# Patient Record
Sex: Female | Born: 1940
Health system: Southern US, Community
[De-identification: ages and names within clinical notes are randomized; demographics above are authoritative.]

## PROBLEM LIST (undated history)

## (undated) DIAGNOSIS — J45909 Unspecified asthma, uncomplicated: Secondary | ICD-10-CM

## (undated) DIAGNOSIS — R059 Cough, unspecified: Secondary | ICD-10-CM

## (undated) DIAGNOSIS — J310 Chronic rhinitis: Secondary | ICD-10-CM

## (undated) DIAGNOSIS — R05 Cough: Secondary | ICD-10-CM

## (undated) DIAGNOSIS — I10 Essential (primary) hypertension: Secondary | ICD-10-CM

## (undated) HISTORY — PX: ABDOMINAL HYSTERECTOMY: SHX81

## (undated) HISTORY — PX: CATARACT EXTRACTION: SUR2

## (undated) HISTORY — PX: BACK SURGERY: SHX140

## (undated) HISTORY — DX: Cough: R05

## (undated) HISTORY — DX: Essential (primary) hypertension: I10

## (undated) HISTORY — DX: Chronic rhinitis: J31.0

## (undated) HISTORY — PX: TOTAL KNEE ARTHROPLASTY: SHX125

## (undated) HISTORY — DX: Cough, unspecified: R05.9

## (undated) HISTORY — PX: ROTATOR CUFF REPAIR: SHX139

## (undated) HISTORY — DX: Unspecified asthma, uncomplicated: J45.909

---

## 2005-03-16 ENCOUNTER — Encounter: Admission: RE | Admit: 2005-03-16 | Discharge: 2005-03-16 | Payer: Self-pay | Admitting: Orthopedic Surgery

## 2005-03-30 ENCOUNTER — Encounter: Admission: RE | Admit: 2005-03-30 | Discharge: 2005-03-30 | Payer: Self-pay | Admitting: Orthopedic Surgery

## 2005-04-29 ENCOUNTER — Inpatient Hospital Stay (HOSPITAL_COMMUNITY): Admission: RE | Admit: 2005-04-29 | Discharge: 2005-05-10 | Payer: Self-pay | Admitting: Orthopaedic Surgery

## 2005-05-10 ENCOUNTER — Inpatient Hospital Stay
Admission: RE | Admit: 2005-05-10 | Discharge: 2005-05-18 | Payer: Self-pay | Admitting: Physical Medicine & Rehabilitation

## 2005-05-10 ENCOUNTER — Ambulatory Visit: Payer: Self-pay | Admitting: Physical Medicine & Rehabilitation

## 2005-10-19 ENCOUNTER — Ambulatory Visit (HOSPITAL_COMMUNITY): Admission: RE | Admit: 2005-10-19 | Discharge: 2005-10-19 | Payer: Self-pay | Admitting: Family Medicine

## 2005-11-01 ENCOUNTER — Ambulatory Visit: Payer: Self-pay | Admitting: Hematology & Oncology

## 2005-11-09 LAB — CBC WITH DIFFERENTIAL/PLATELET
BASO%: 1 % (ref 0.0–2.0)
Basophils Absolute: 0 10*3/uL (ref 0.0–0.1)
EOS%: 2 % (ref 0.0–7.0)
Eosinophils Absolute: 0.1 10*3/uL (ref 0.0–0.5)
HCT: 41.8 % (ref 34.8–46.6)
HGB: 14.1 g/dL (ref 11.6–15.9)
LYMPH%: 57 % — ABNORMAL HIGH (ref 14.0–48.0)
MCH: 30.5 pg (ref 26.0–34.0)
MCHC: 33.6 g/dL (ref 32.0–36.0)
MCV: 90.9 fL (ref 81.0–101.0)
MONO#: 0.2 10*3/uL (ref 0.1–0.9)
MONO%: 7.5 % (ref 0.0–13.0)
NEUT#: 0.8 10*3/uL — ABNORMAL LOW (ref 1.5–6.5)
NEUT%: 32.5 % — ABNORMAL LOW (ref 39.6–76.8)
Platelets: 174 10*3/uL (ref 145–400)
RBC: 4.61 10*6/uL (ref 3.70–5.32)
RDW: 14.3 % (ref 11.3–14.5)
WBC: 2.5 10*3/uL — ABNORMAL LOW (ref 3.9–10.0)
lymph#: 1.4 10*3/uL (ref 0.9–3.3)

## 2005-11-09 LAB — CHCC SMEAR

## 2005-12-13 ENCOUNTER — Encounter (INDEPENDENT_AMBULATORY_CARE_PROVIDER_SITE_OTHER): Payer: Self-pay | Admitting: *Deleted

## 2005-12-13 ENCOUNTER — Ambulatory Visit (HOSPITAL_COMMUNITY): Admission: RE | Admit: 2005-12-13 | Discharge: 2005-12-13 | Payer: Self-pay | Admitting: Hematology & Oncology

## 2007-05-08 ENCOUNTER — Encounter: Admission: RE | Admit: 2007-05-08 | Discharge: 2007-06-05 | Payer: Self-pay | Admitting: Anesthesiology

## 2008-04-15 ENCOUNTER — Encounter: Admission: RE | Admit: 2008-04-15 | Discharge: 2008-04-15 | Payer: Self-pay | Admitting: Neurology

## 2008-08-12 ENCOUNTER — Other Ambulatory Visit: Admission: RE | Admit: 2008-08-12 | Discharge: 2008-08-12 | Payer: Self-pay | Admitting: Family Medicine

## 2010-07-10 ENCOUNTER — Encounter: Payer: Self-pay | Admitting: Orthopedic Surgery

## 2010-11-05 NOTE — H&P (Signed)
[name], [name]              ACCOUNT NO.:  [id number]   MEDICAL RECORD NO.:  [id number]          PATIENT TYPE:  ORB   LOCATION:  4524                         FACILITY:  [facility]   PHYSICIAN:  Erick Colace, M.D.DATE OF BIRTH:  [birth year]   DATE OF ADMISSION:  05/10/2005  DATE OF DISCHARGE:                                HISTORY & PHYSICAL   [name] is a 70 year old female with history of DJD, degenerative disk  disease, and progressive low back with radiculopathy secondary to L4-5, L5-  S1 herniated nucleus pulposus, elected to undergo L4-5 and L5-S1  microdiskectomy per Dr. Ophelia Charter on April 29, 2005.  Postoperatively with  left lower extremity weakness and pain control issues.  CT of L spine done  on April 29, 2005, showed no evidence of hematoma.   REVIEW OF SYSTEMS:  Positive for wound area pain, lumbago, weakness,  numbness, insomnia.   PAST MEDICAL HISTORY:  1.  Hypertension.  2.  Hysterectomy.  3.  Left total knee replacement.  4.  Sleep apnea.   FAMILY HISTORY:  Carcinoma.   SOCIAL HISTORY:  She lives alone in two-level home, one step to enter, has  bedroom on first level.  No tobacco, no EtOH.   FUNCTIONAL HISTORY:  Independent with cane.   FUNCTIONAL STATUS:  Requiring assistance for ADLs and mobility.   MEDICATIONS PRIOR TO ADMISSION:  None reported.   CURRENT MEDICATIONS:  1.  Prinivil 20 mg daily.  2.  Singulair 10 mg p.o. daily.  3.  Trileptal 150 mg twice daily.  4.  Neurontin 600 mg p.o. twice daily.  5.  Ambien CR 12.5 p.o. nightly.  6.  Os-Cal with D 1 p.o. twice daily.  7.  Senokot S 2 p.o. nightly.  8.  Robaxin 500 mg p.o. 4 times a day p.r.n.  9.  Lovenox 40 mg p.o. daily.   PHYSICAL EXAMINATION:  GENERAL: No acute distress.  Mood and affect  appropriate.  LUNGS: Clear.  HEART:  Regular rate and rhythm.  No rubs, murmurs, extra sounds.  ABDOMEN:  Positive bowel sounds.  Nontender to palpation.  EXTREMITIES:  No clubbing,  cyanosis, or edema.  Straight leg raise is  negative.  NEUROLOGIC: Motor strength 5/5 bilateral upper extremities and then 4/5 in  the left hip flexor, quad, TA, and gastroc; and 5/5 on the right.  Mood,  memory, affect, orientation are normal. Left lower extremity is  hypersensitive to touch.   IMPRESSION:  1.  Herniated nucleus pulposus L4-5, L5-S1 with radiculopathy.  She is      status post microdiskectomy.  2.  Hypertension.  Continue monitor on Prinivil.  3.  History of reflex sympathetic dystrophy left lower extremity on      Neurontin for extremity, question pain related.  4.  Status post left lower extremity total knee replacement in 2004.   PLAN:  Estimated length of stay is 7 to 10 days.  The patient is a good  rehab candidate for [facility] level.      Erick Colace, M.D.  Electronically Signed     AEK/MEDQ  D:  05/10/2005  T:  05/10/2005  Job:  [id number]

## 2010-11-05 NOTE — Op Note (Signed)
NAMEGENELLA, BAS              ACCOUNT NO.:  [id number]   MEDICAL RECORD NO.:  [id number]          PATIENT TYPE:  OUT   LOCATION:  OMED                         FACILITY:  [facility]   PHYSICIAN:  Rose Phi. Myna Hidalgo, M.D. DATE OF BIRTH:  22-Feb-1941   DATE OF PROCEDURE:  12/13/2005  DATE OF DISCHARGE:                                 OPERATIVE REPORT   NATURE OF THE PROCEDURE:  Left posterior iliac crest bone marrow biopsy and  aspirate.   DESCRIPTION OF PROCEDURE:  Ms. Gangi was brought to the short-stay unit.  She was placed onto her right side.  She received a total of 5 mg of Versed  and 25 mg of Demerol for sedation via IV.   The left posterior iliac crest region was prepped and draped in a sterile  fashion.  Lidocaine 2% was infiltrated under the skin down to the  periosteum.  I used 10 cc.  I needed a 22-gauge spinal needle to reach the  posterior iliac crest.   An #11 scalpel was used to make an incision into the skin.  Two bone marrow  aspirates were obtained without difficulty.   A second incision was made into the skin.  A good bone marrow biopsy core  was obtained without difficulty.   The patient tolerated the procedure well.  There were no complications.      Rose Phi. Myna Hidalgo, M.D.  Electronically Signed     PRE/MEDQ  D:  12/13/2005  T:  12/13/2005  Job:  [id number]

## 2010-11-05 NOTE — Discharge Summary (Signed)
[name], HOEGER              ACCOUNT NO.:  [id number]   MEDICAL RECORD NO.:  [id number]          PATIENT TYPE:  INP   LOCATION:  5033                         FACILITY:  [facility]   PHYSICIAN:  Mark C. Ophelia Charter, M.D.    DATE OF BIRTH:  1940/12/01   DATE OF ADMISSION:  04/29/2005  DATE OF DISCHARGE:  05/10/2005                                 DISCHARGE SUMMARY   FINAL DIAGNOSIS:  Left L4-5, left L5-S1 herniated nucleus pulposus.   ADDITIONAL DIAGNOSES:  1.  Hypertension.  2.  Previous left knee replacement with arthrofibrosis.   This 70 year old female has had knee replacement done in [place] with  revision, only has about 30 degrees of range of motion of her knee.  Has had  progressive back pain at L5-S1, HNP at L4-5 and L5-S1.  She was admitted and  after informed consent underwent microdiskectomy, which went well.  Postoperatively the patient was very slow in moving due to weakness in her  left quadriceps from her knee procedure.  She was slow to mobilize and  stated that she wanted to go to rehab, did not want to go home, and despite  family present she did not have any family that would be able to help her.  She states she lives alone and due to patient's problems with ambulation,  arrangements were made for skilled nursing facility.  The patient was then  upset and stated she did not want to go to a nursing home.  Finally a bed  opened up on subacute.  She was ambulating to the desk and back by the 20th  and did continue complaints of pain.  A CT scan was performed, which showed  satisfactory laminectomy at L4-5 and L5-S1, mild residual disk protrusion of  L5-S1 as expected.  Anterior tibialis, gastrocnemius, soleus, peroneus were  all normal.  EHL was only slightly weak at 5-/5, but her exam was  significantly improved from preoperatively.  She still had the old left  total knee arthroplasty severe quadriceps weakness with flexion deformity  and extension lag.  She has  continued to work with physical therapy and  finally after staples were removed, she was finally transferred to [facility].   FINAL DIAGNOSES:  1.  Herniated nucleus pulposus, L4-5 and L5-S1.  2.  Arthrofibrosis from old left total knee arthroplasty with revision      procedure.      Mark C. Ophelia Charter, M.D.  Electronically Signed     MCY/MEDQ  D:  07/11/2005  T:  07/12/2005  Job:  [id number]

## 2010-11-05 NOTE — Discharge Summary (Signed)
[name], [name]              ACCOUNT NO.:  [id number]   MEDICAL RECORD NO.:  [id number]          PATIENT TYPE:  ORB   LOCATION:  4524                         FACILITY:  [facility]   PHYSICIAN:  Ranelle Oyster, M.D.DATE OF BIRTH:  Jul 13, 1940   DATE OF ADMISSION:  05/10/2005  DATE OF DISCHARGE:  05/18/2005                                 DISCHARGE SUMMARY   DISCHARGE DIAGNOSES:  1.  Herniated nucleus pulposus L4-L5, L5-S1, with radiculopathy, left lower      extremity.  2.  Reflex sympathetic dystrophy, left lower extremity.  3.  Hypertension.   HISTORY OF PRESENT ILLNESS:  [name] is a 70 year old female with  history of DDD, DJD, with progressive low back pain and radiculopathy of the  left lower extremity secondary to L4-L5 and L5-S1 HNP.  She elected to  undergo L4-L5 and L5-S1 microdiskectomy by Dr. Ophelia Charter on November 10.  Postop, the patient is noted to have increasing left lower extremity  weakness and problems with pain control.  CT L-spine done on November 17 for  follow up shows postop changes without hematoma.  Currently, patient  requiring assist for ADLs and mobility and __________ was consulted for  therapy.   PAST MEDICAL HISTORY:  1.  Significant for hypertension.  2.  Hysterectomy.  3.  Left total knee with re-do in April 2006.  4.  RSD of the left lower extremity.  5.  Sleep apnea.   ALLERGIES:  NO KNOWN DRUG ALLERGIES.   FAMILY HISTORY:  Positive for cancer.   SOCIAL HISTORY:  The patient lives alone in two-level home with one step at  entrance.  Has bedroom on first level.  Does not use any tobacco or alcohol.   HOSPITAL COURSE:  [name] was admitted to subacute on May 10, 2005 for __________ therapies to consist for PT and OT daily.  Past  admission, the patient was maintained on Neurontin and Trileptal for her  RSD.  A trial of lidocaine patch to left lower back was tried to help with  pain control issues.  Overall, the  patient's pain was managed with p.r.n.  use of oxycodone and with scheduling of Flexeril 5 mg t.i.d.  Labs done  prior to admission revealed mild anemia with hemoglobin 11.9, hematocrit  35.3, white count 3.7, platelets 226.  Electrolytes revealed sodium 138,  potassium 3.9, chloride 106, CO2 29, BUN 10, creatinine 0.8, glucose 97,  total protein 5.8, albumin 3.0.   The patient's back incision along and this has been healing well without any  signs of infection.  During her stay in subacute, the patient made progress.  She progressed to being at modified independent level for transfers,  modified independent for ambulating 150 feet, modified independent for ADLs,  modified independent for toileting and hygiene as well as advance the ADLs.  Follow-up therapies to include home health, PT, OT by [facility].  On May 18, 2005, the patient is discharged to home.   DISCHARGE MEDICATIONS:  1.  Neurontin 600 mg b.i.d.  2.  Prinivil 20 mg a day.  3.  Singulair 10 mg a day.  4.  Trileptal 150 mg b.i.d.  5.  Ambien CR 12.5 mg a day.  6.  Multivitamin once a day.  7.  Os-Cal +D b.i.d.  8.  Flexeril 5 mg t.i.d.  9.  For spasms, oxy IR 5-10 mg q.4h6. p.r.n. pain.   DIET:  Regular.   ACTIVITY:  As tolerated with use of a walker.   SPECIAL INSTRUCTIONS:  Do not use the hydrocodone.  [facility] to  draw PT, INR, RN.   FOLLOWUP:  The patient is to follow up with Dr. Ophelia Charter and Dr. Parke Simmers for  routine check.  Follow up with Dr. Riley Kill as needed.      Greg Cutter, P.A.      Ranelle Oyster, M.D.  Electronically Signed    PP/MEDQ  D:  05/19/2005  T:  05/20/2005  Job:  [id number]   cc:   Renaye Rakers, M.D.  Fax: [phone]   Veverly Fells. Ophelia Charter, M.D.  Fax: [phone]

## 2010-11-05 NOTE — Op Note (Signed)
[name], [name]              ACCOUNT NO.:  [id number]   MEDICAL RECORD NO.:  [id number]          PATIENT TYPE:  ORB   LOCATION:  4524                         FACILITY:  [facility]   PHYSICIAN:  Mark C. Ophelia Charter, M.D.    DATE OF BIRTH:  09-Jul-1940   DATE OF PROCEDURE:  DATE OF DISCHARGE:  05/18/2005                                 OPERATIVE REPORT   NO DICTATION.      Mark C. Ophelia Charter, M.D.  Electronically Signed     MCY/MEDQ  D:  07/11/2005  T:  07/12/2005  Job:  [id number]

## 2010-11-05 NOTE — Op Note (Signed)
[name], [name]              ACCOUNT NO.:  [id number]   MEDICAL RECORD NO.:  [id number]          PATIENT TYPE:  AMB   LOCATION:  SDS                          FACILITY:  [facility]   PHYSICIAN:  Mark C. Ophelia Charter, M.D.    DATE OF BIRTH:  [birth year]   DATE OF PROCEDURE:  04/29/2005  DATE OF DISCHARGE:                                 OPERATIVE REPORT   PREOPERATIVE DIAGNOSIS:  Left L4-5, left L5-S1 herniated nucleus pulposus.   POSTOPERATIVE DIAGNOSIS:  Left L4-5, left L5-S1 herniated nucleus pulposus.   OPERATION PERFORMED:  Left L4-5, left L5-S1 microdiskectomy (two-level  procedure).   SURGEON:  Mark C. Ophelia Charter, M.D.   ASSISTANT:  Patrick Jupiter, RN, FA   ANESTHESIA:  GOT   ESTIMATED BLOOD LOSS:  Minimal.   DESCRIPTION OF PROCEDURE:  After induction of general anesthesia orotracheal  intubation, patient was transferred to the [facility] table, but due to a  previous total knee arthroplasty done, that would only allow about 30  degrees flexion, it would be altered to lay the foot piece down and pillows  were placed underneath the knee and underneath her tibia.   1015 drape was place.  Back was prepped with DuraPrep.  The area was squared  with towels and Betadine Vi-drape applied and laminectomy sheet.  Needle  placed with the palpable expected L5 level and one at the L4-5 level.  Cross-  table lateral x-ray confirmed this was at the appropriate level.   After confirmation, incision was made at the midline and dissection on the  left side was performed, subperiosteal dissection and a deep Taylor  retractor was placed laterally on the left side.  L5-S1 laminotomy was  performed.  Foramina was enlarged.  There was considerable facet overgrowth  and ligamentum overgrowth laterally.  Nerve root was dorsally displaced and  once it was gently retracted toward the midline, there was a large disk  herniation where the annulus had been ruptured and pieces of nuclear disk  were sticking out  like a cauliflower.  Piece was gently teased and using the  nerve hook, the annular defect was opened further and a giant chunk of disk  was removed.  This basically decompress the space.  The nerve root was now  gently able to retract toward the midline.  Multiple passes were made  removing some remaining degenerative disk but basically, the large fragment  that had ruptured was on the left side and slightly inferior though the disk  space took care of the patient's compression problem.  Bone was taken out to  the level follow-up the pedicle.  Passes were made with a hockey stick and  Epsteins.  Identical procedure was repeated at 4-5 disk level.  There was a  broad based disk rupture with a knuckle of disk which actually was very  firm, fibrotic.  Some Epstein was used to push the disk material down out  laterally but again, this all appeared old and did not seem to be giving the  pathologic picture that would suggest the patient's acute symptoms like the  5-1 level.  There was  hypertrophic ligament facet overgrowth. This was  trimmed back giving the nerve root more freedom as it laid laterally in the  gutter.  No free fragments were present.  After thorough irrigation and  aspiration, fascia was closed with 0 Vicryl.  This broke off the edge,  leaving a 2 mm x 2 mm piece of broken needle at the eye and the deep fascia.  The patient was not very tall, weighed at 200 pounds plus.  Option was to  excise a large amount of fascia and adipose tissue to get this or leave it.  X-ray was taken and confirmed it was in subcutaneous tissue and fascial  layer, not deep in the canal and after irrigation, further closure with 2-0  and subcutaneous tissue and skin staple closure was applied.  Xeroform, 4 x  4s, instrument count, needle count was correct.      Mark C. Ophelia Charter, M.D.  Electronically Signed     MCY/MEDQ  D:  04/29/2005  T:  04/30/2005  Job:  [id number]

## 2010-11-05 NOTE — Op Note (Signed)
NAMESOUL, DEVENEY              ACCOUNT NO.:  [id number]   MEDICAL RECORD NO.:  [id number]          PATIENT TYPE:  INP   LOCATION:  5033                         FACILITY:  [facility]   PHYSICIAN:  Mark C. Ophelia Charter, M.D.    DATE OF BIRTH:  1941-01-31   DATE OF PROCEDURE:  04/29/2005  DATE OF DISCHARGE:  05/10/2005                                 OPERATIVE REPORT   PREOPERATIVE DIAGNOSIS:  Left L4-5, left L5-S1 herniated nucleus pulposus.   POSTOPERATIVE DIAGNOSIS:  Left L4-5, left L5-S1 herniated nucleus pulposus.   PROCEDURE:  Left L4-5, left L5-S1 microdiscectomy.   SURGEON:  Mark C. Ophelia Charter, M.D.   ANESTHESIA:  GOT.   ASSISTANT:  RNFA.   ESTIMATED BLOOD LOSS:  Minimal.   BRIEF CLINICAL HISTORY:  This 70 year old female has had lumbar pain x5  months without relief with MRI scan showing L4-5 and L5-S1 HNP left.  She  has positive straight leg raising at 45 degrees with both anterior tib, EHL  and some gastroc soleus weakness.   After induction of general anesthesia and orotracheal intubation, the  patient in prone position on the Gary frame, standard prepping and  draping was performed.  Needle localization confirmed the appropriate level.  Subperiosteal dissection laterally was performed.  L4-5 level was surgically  treated first.  Subperiosteal dissection laterally with talar retractor 5  pound weight.  Ligamentum was incised.  There was hypertrophic ligamentum  facet overhang and a bulging disc which was decompressed after the nerve  root was gently retracted.  Dura was intact.  Disc was bulging and after  annulus was incised, disc was decompressed using micro and then regular  pituitaries, up/down pituitaries.  Some remaining ligament was removed out  to the level of the pedicle for decompression.  Next, foraminotomy was  performed at L5-S1  and again at this level the disc appeared more firm with  facet overhang and microdiscectomy was performed at this level as well.  Disc was decompressed using combination of Epstein curettes, up and down  micropituitaries.  Wound was irrigated, 180 degree sweeps could be made with  hockey stick anterior to the dura consistent with satisfactory  decompression.  Wound was irrigated and layered closure with 0 Vicryl in  deep fascia, 2-0 Vicryl in subcutaneous tissue, skin staple closure.  Postoperative dressing and then transferred to the recovery room.      Mark C. Ophelia Charter, M.D.  Electronically Signed    MCY/MEDQ  D:  07/11/2005  T:  07/12/2005  Job:  [id number]

## 2011-07-01 DIAGNOSIS — M25559 Pain in unspecified hip: Secondary | ICD-10-CM | POA: Diagnosis not present

## 2011-07-01 DIAGNOSIS — M5126 Other intervertebral disc displacement, lumbar region: Secondary | ICD-10-CM | POA: Diagnosis not present

## 2011-07-01 DIAGNOSIS — G541 Lumbosacral plexus disorders: Secondary | ICD-10-CM | POA: Diagnosis not present

## 2011-07-01 DIAGNOSIS — F341 Dysthymic disorder: Secondary | ICD-10-CM | POA: Diagnosis not present

## 2011-07-18 DIAGNOSIS — L680 Hirsutism: Secondary | ICD-10-CM | POA: Diagnosis not present

## 2011-07-18 DIAGNOSIS — L089 Local infection of the skin and subcutaneous tissue, unspecified: Secondary | ICD-10-CM | POA: Diagnosis not present

## 2011-07-18 DIAGNOSIS — L658 Other specified nonscarring hair loss: Secondary | ICD-10-CM | POA: Diagnosis not present

## 2011-07-18 DIAGNOSIS — L659 Nonscarring hair loss, unspecified: Secondary | ICD-10-CM | POA: Diagnosis not present

## 2011-07-18 DIAGNOSIS — L439 Lichen planus, unspecified: Secondary | ICD-10-CM | POA: Diagnosis not present

## 2011-07-20 DIAGNOSIS — R059 Cough, unspecified: Secondary | ICD-10-CM | POA: Diagnosis not present

## 2011-07-20 DIAGNOSIS — J310 Chronic rhinitis: Secondary | ICD-10-CM | POA: Diagnosis not present

## 2011-07-20 DIAGNOSIS — R05 Cough: Secondary | ICD-10-CM | POA: Diagnosis not present

## 2011-08-02 DIAGNOSIS — R269 Unspecified abnormalities of gait and mobility: Secondary | ICD-10-CM | POA: Diagnosis not present

## 2011-08-02 DIAGNOSIS — F341 Dysthymic disorder: Secondary | ICD-10-CM | POA: Diagnosis not present

## 2011-08-02 DIAGNOSIS — H81399 Other peripheral vertigo, unspecified ear: Secondary | ICD-10-CM | POA: Diagnosis not present

## 2011-08-02 DIAGNOSIS — M25559 Pain in unspecified hip: Secondary | ICD-10-CM | POA: Diagnosis not present

## 2011-08-02 DIAGNOSIS — R42 Dizziness and giddiness: Secondary | ICD-10-CM | POA: Diagnosis not present

## 2011-08-02 DIAGNOSIS — G541 Lumbosacral plexus disorders: Secondary | ICD-10-CM | POA: Diagnosis not present

## 2011-08-02 DIAGNOSIS — M5126 Other intervertebral disc displacement, lumbar region: Secondary | ICD-10-CM | POA: Diagnosis not present

## 2011-08-04 DIAGNOSIS — Z1231 Encounter for screening mammogram for malignant neoplasm of breast: Secondary | ICD-10-CM | POA: Diagnosis not present

## 2011-08-09 DIAGNOSIS — R928 Other abnormal and inconclusive findings on diagnostic imaging of breast: Secondary | ICD-10-CM | POA: Diagnosis not present

## 2011-10-04 DIAGNOSIS — E785 Hyperlipidemia, unspecified: Secondary | ICD-10-CM | POA: Diagnosis not present

## 2011-10-04 DIAGNOSIS — I10 Essential (primary) hypertension: Secondary | ICD-10-CM | POA: Diagnosis not present

## 2011-10-20 DIAGNOSIS — J309 Allergic rhinitis, unspecified: Secondary | ICD-10-CM | POA: Diagnosis not present

## 2011-10-20 DIAGNOSIS — R059 Cough, unspecified: Secondary | ICD-10-CM | POA: Diagnosis not present

## 2011-10-20 DIAGNOSIS — I10 Essential (primary) hypertension: Secondary | ICD-10-CM | POA: Diagnosis not present

## 2011-10-20 DIAGNOSIS — E7800 Pure hypercholesterolemia, unspecified: Secondary | ICD-10-CM | POA: Diagnosis not present

## 2011-10-20 DIAGNOSIS — R05 Cough: Secondary | ICD-10-CM | POA: Diagnosis not present

## 2011-10-25 DIAGNOSIS — H81399 Other peripheral vertigo, unspecified ear: Secondary | ICD-10-CM | POA: Diagnosis not present

## 2011-10-25 DIAGNOSIS — Z79899 Other long term (current) drug therapy: Secondary | ICD-10-CM | POA: Diagnosis not present

## 2011-10-25 DIAGNOSIS — M25559 Pain in unspecified hip: Secondary | ICD-10-CM | POA: Diagnosis not present

## 2011-10-25 DIAGNOSIS — G541 Lumbosacral plexus disorders: Secondary | ICD-10-CM | POA: Diagnosis not present

## 2011-10-25 DIAGNOSIS — F341 Dysthymic disorder: Secondary | ICD-10-CM | POA: Diagnosis not present

## 2011-11-02 DIAGNOSIS — H524 Presbyopia: Secondary | ICD-10-CM | POA: Diagnosis not present

## 2011-11-02 DIAGNOSIS — H35369 Drusen (degenerative) of macula, unspecified eye: Secondary | ICD-10-CM | POA: Diagnosis not present

## 2011-11-02 DIAGNOSIS — H2510 Age-related nuclear cataract, unspecified eye: Secondary | ICD-10-CM | POA: Diagnosis not present

## 2011-11-22 DIAGNOSIS — I10 Essential (primary) hypertension: Secondary | ICD-10-CM | POA: Diagnosis not present

## 2011-12-12 DIAGNOSIS — S6990XA Unspecified injury of unspecified wrist, hand and finger(s), initial encounter: Secondary | ICD-10-CM | POA: Diagnosis not present

## 2011-12-12 DIAGNOSIS — M6740 Ganglion, unspecified site: Secondary | ICD-10-CM | POA: Diagnosis not present

## 2011-12-12 DIAGNOSIS — S6980XA Other specified injuries of unspecified wrist, hand and finger(s), initial encounter: Secondary | ICD-10-CM | POA: Diagnosis not present

## 2011-12-12 DIAGNOSIS — M19049 Primary osteoarthritis, unspecified hand: Secondary | ICD-10-CM | POA: Diagnosis not present

## 2011-12-30 ENCOUNTER — Other Ambulatory Visit: Payer: Self-pay | Admitting: Orthopedic Surgery

## 2012-01-03 DIAGNOSIS — M1990 Unspecified osteoarthritis, unspecified site: Secondary | ICD-10-CM | POA: Diagnosis not present

## 2012-01-03 DIAGNOSIS — I10 Essential (primary) hypertension: Secondary | ICD-10-CM | POA: Diagnosis not present

## 2012-01-05 ENCOUNTER — Encounter (HOSPITAL_BASED_OUTPATIENT_CLINIC_OR_DEPARTMENT_OTHER)
Admission: RE | Disposition: A | Discharge status: Home / Self Care | Payer: Self-pay | Source: Ambulatory Visit | Attending: Orthopedic Surgery

## 2012-01-05 ENCOUNTER — Ambulatory Visit (HOSPITAL_BASED_OUTPATIENT_CLINIC_OR_DEPARTMENT_OTHER)
Admission: RE | Admit: 2012-01-05 | Discharge: 2012-01-05 | Disposition: A | Discharge status: Home / Self Care | Payer: Medicare Other | Source: Ambulatory Visit | Attending: Orthopedic Surgery | Admitting: Orthopedic Surgery

## 2012-01-05 DIAGNOSIS — M19049 Primary osteoarthritis, unspecified hand: Secondary | ICD-10-CM | POA: Insufficient documentation

## 2012-01-05 DIAGNOSIS — L609 Nail disorder, unspecified: Secondary | ICD-10-CM | POA: Diagnosis not present

## 2012-01-05 DIAGNOSIS — M659 Unspecified synovitis and tenosynovitis, unspecified site: Secondary | ICD-10-CM | POA: Insufficient documentation

## 2012-01-05 DIAGNOSIS — L723 Sebaceous cyst: Secondary | ICD-10-CM | POA: Insufficient documentation

## 2012-01-05 DIAGNOSIS — Z79899 Other long term (current) drug therapy: Secondary | ICD-10-CM | POA: Insufficient documentation

## 2012-01-05 DIAGNOSIS — M25449 Effusion, unspecified hand: Secondary | ICD-10-CM | POA: Diagnosis not present

## 2012-01-05 DIAGNOSIS — M6740 Ganglion, unspecified site: Secondary | ICD-10-CM | POA: Diagnosis not present

## 2012-01-05 DIAGNOSIS — M24049 Loose body in unspecified finger joint(s): Secondary | ICD-10-CM | POA: Insufficient documentation

## 2012-01-05 HISTORY — PX: MASS EXCISION: SHX2000

## 2012-01-05 SURGERY — MINOR EXCISION OF MASS
Anesthesia: LOCAL | Site: Hand | Laterality: Right | Wound class: Clean

## 2012-01-05 MED ORDER — LIDOCAINE HCL 2 % IJ SOLN
INTRAMUSCULAR | Status: DC | PRN
  Administered 2012-01-05: 4 mL

## 2012-01-05 MED ORDER — HYDROCODONE-ACETAMINOPHEN 5-500 MG PO TABS: 1.0000 | ORAL_TABLET | Freq: Four times a day (QID) | ORAL | Status: AC | PRN

## 2012-01-05 MED ORDER — CEPHALEXIN 500 MG PO CAPS: 500.0000 mg | ORAL_CAPSULE | Freq: Three times a day (TID) | ORAL | Status: AC

## 2012-01-05 MED ORDER — CHLORHEXIDINE GLUCONATE 4 % EX LIQD: 60.0000 mL | Freq: Once | CUTANEOUS | Status: DC

## 2012-01-05 SURGICAL SUPPLY — 39 items
BANDAGE ADHESIVE 1X3 (GAUZE/BANDAGES/DRESSINGS) IMPLANT
BLADE SURG 15 STRL LF DISP TIS (BLADE) ×1 IMPLANT
BLADE SURG 15 STRL SS (BLADE) ×2
BNDG CMPR 9X4 STRL LF SNTH (GAUZE/BANDAGES/DRESSINGS) ×1
BNDG CMPR MD 5X2 ELC HKLP STRL (GAUZE/BANDAGES/DRESSINGS)
BNDG COHESIVE 1X5 TAN STRL LF (GAUZE/BANDAGES/DRESSINGS) ×1 IMPLANT
BNDG ELASTIC 2 VLCR STRL LF (GAUZE/BANDAGES/DRESSINGS) IMPLANT
BNDG ESMARK 4X9 LF (GAUZE/BANDAGES/DRESSINGS) ×1 IMPLANT
BRUSH SCRUB EZ PLAIN DRY (MISCELLANEOUS) ×2 IMPLANT
CLOTH BEACON ORANGE TIMEOUT ST (SAFETY) ×2 IMPLANT
CORDS BIPOLAR (ELECTRODE) IMPLANT
COVER MAYO STAND STRL (DRAPES) ×3 IMPLANT
COVER TABLE BACK 60X90 (DRAPES) ×1 IMPLANT
CUFF TOURNIQUET SINGLE 18IN (TOURNIQUET CUFF) IMPLANT
DECANTER SPIKE VIAL GLASS SM (MISCELLANEOUS) IMPLANT
DRAIN PENROSE 1/2X12 LTX STRL (WOUND CARE) IMPLANT
DRAPE SURG 17X23 STRL (DRAPES) ×2 IMPLANT
GAUZE SPONGE 4X4 12PLY STRL LF (GAUZE/BANDAGES/DRESSINGS) ×2 IMPLANT
GAUZE XEROFORM 1X8 LF (GAUZE/BANDAGES/DRESSINGS) ×1 IMPLANT
GLOVE BIOGEL M STRL SZ7.5 (GLOVE) ×1 IMPLANT
GLOVE ORTHO TXT STRL SZ7.5 (GLOVE) ×2 IMPLANT
GOWN PREVENTION PLUS XLARGE (GOWN DISPOSABLE) ×2 IMPLANT
NDL BLUNT 17GA (NEEDLE) IMPLANT
NEEDLE 27GAX1X1/2 (NEEDLE) IMPLANT
NEEDLE BLUNT 17GA (NEEDLE) ×2 IMPLANT
PACK BASIN DAY SURGERY FS (CUSTOM PROCEDURE TRAY) ×3 IMPLANT
PADDING CAST ABS 4INX4YD NS (CAST SUPPLIES) ×1
PADDING CAST ABS COTTON 4X4 ST (CAST SUPPLIES) ×1 IMPLANT
SPONGE GAUZE 4X4 12PLY (GAUZE/BANDAGES/DRESSINGS) ×1 IMPLANT
STOCKINETTE 4X48 STRL (DRAPES) ×2 IMPLANT
SUT ETHILON 5 0 P 3 18 (SUTURE) ×1
SUT NYLON ETHILON 5-0 P-3 1X18 (SUTURE) ×1 IMPLANT
SYR 20CC LL (SYRINGE) ×1 IMPLANT
SYR 3ML 23GX1 SAFETY (SYRINGE) IMPLANT
SYR CONTROL 10ML LL (SYRINGE) IMPLANT
TOWEL OR 17X24 6PK STRL BLUE (TOWEL DISPOSABLE) ×3 IMPLANT
TRAY DSU PREP LF (CUSTOM PROCEDURE TRAY) ×2 IMPLANT
UNDERPAD 30X30 INCONTINENT (UNDERPADS AND DIAPERS) ×2 IMPLANT
WATER STERILE IRR 1000ML POUR (IV SOLUTION) ×2 IMPLANT

## 2012-01-05 NOTE — Brief Op Note (Signed)
01/05/2012  8:21 AM  PATIENT:  [name]  71 y.o. female  PRE-OPERATIVE DIAGNOSIS:  mucoid cyst right long finger, history of trauma 2012.  POST-OPERATIVE DIAGNOSIS:  Mucoid cyst right long finger, djd of distal interphalangeal joint  PROCEDURE: debridement of distal interphalangeal joint, excision of mucoid cyst, synovectomy and nail decompression   SURGEON: Wyn Forster., MD   PHYSICIAN ASSISTANT:   ASSISTANTS: nurse  ANESTHESIA:   local  EBL:     BLOOD ADMINISTERED:none  DRAINS: none   LOCAL MEDICATIONS USED:  LIDOCAINE   SPECIMEN:  No Specimen  DISPOSITION OF SPECIMEN:  N/A  COUNTS:  YES  TOURNIQUET:   Total Tourniquet Time Documented: area (laterality) - 12 minutes  DICTATION: .Other Dictation: Dictation Number [id number]  PLAN OF CARE: Discharge to home after PACU  PATIENT DISPOSITION:  PACU - hemodynamically stable.

## 2012-01-05 NOTE — Op Note (Signed)
[name], [name]              ACCOUNT NO.:  [id number]  MEDICAL RECORD NO.:  [id number]  LOCATION:                                 FACILITY:  PHYSICIAN:  [name]. [name], M.D.      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  OPERATION: 1. Resection of mucoid cyst with decompression of right long finger     nail fold. 2. Debridement of DIP joint with synovectomy, removal of loose bodies,     and marginal osteophytes. 3. Irrigation DIP joint.  OPERATING SURGEON:  [name]. Sonya Epling, MD  ASSISTANT:  Nurse.  ANESTHESIA:  2% lidocaine metacarpal head level block, 4 mL 2% plain lidocaine.  This was performed as a minor operating room procedure.  PROCEDURE:  [name] was brought to room 1 of the [facility] and placed in supine position upon the operating table.  Following a proper surgical site identification protocol, her right long finger was marked in the holding area.  In room 1 after informed consent and alcohol Betadine prep, 2% lidocaine was infiltrated into the region of the common digital nerves to the long finger without complication. Within 5 minutes, excellent anesthesia of the long finger was achieved.  The right hand and arm were then prepped with Betadine soap and solution, sterilely draped.  A sterile stockinette and impervious arthroscopy drapes were applied.  Following routine surgical time-out, procedure  commenced with exsanguination of the right long finger with a latex-free Esmarch bandage.  We did note [name] had latex intolerance, therefore the entire procedure was conducted with latex-free technique.  Procedure commenced with a curvilinear incision incorporating the base of the dorsal mucoid cyst into an incision exposing the interval between the extensor tendon and the radiocollateral ligament.  Subcutaneous tissues were carefully divided taking care to prevent injury to the dorsal sensory branches of the radial proper digital nerve.  The capsule of the DIP joint was identified and a capsulotomy was performed between the distal terminal extensor tendon slip and the radiocollateral ligament.  A synovectomy of the distal interphalangeal joint was accomplished with micro rongeur and curette followed by removal of marginal osteophytes with a curette and the micro rongeur.  The DIP joint was then thoroughly irrigated.  The myxoid cyst was then unroofed, its contents were excised, and its base curetted to remove the membrane.  Bleeding points were not problematic.  The wound was then repaired with mattress suture of 5-0 nylon.  A compressive dressing was applied with Xeroflo, sterile gauze, and Coban finger dressing.  For aftercare, [name] is provided a prescription for hydrocodone 5 mg 1 p.o. q.4-6 h. p.r.n. pain, 20 tablets without refill.  She will also be provided Keflex 5 mg  1 p.o. q.8 h. x4 days as a prophylactic antibiotic due to both osseous and intra-articular workup.     [name] [name], M.D.     [name]  D:  01/05/2012  T:  01/05/2012  Job:  [id number]

## 2012-01-05 NOTE — H&P (Signed)
[name] is an 71 y.o. female.   Chief Complaint: Right long finger nail deformity and mucoid cyst. HPI: 71 year old right handed woman with a nail deformity on the radial third of the nail plate due to to a chronic mucoid cyst and osteoarthritis of the distal interphalangeal joint.   No past medical history on file.  No past surgical history on file.  No family history on file. Social History:  does not have a smoking history on file. She does not have any smokeless tobacco history on file. Her alcohol and drug histories not on file.  Allergies:  Allergies  Allergen Reactions  . Latex Shortness Of Breath    Medications Prior to Admission  Medication Sig Dispense Refill  . Beclomethasone Dipropionate (QNASL) 80 MCG/ACT AERS Place into the nose.      . Calcium-Magnesium (CALCIUM MAGNESIUM 750) 300-300 MG TABS Take by mouth.      . Diclofenac Sodium (PENNSAID) 1.5 % SOLN Place onto the skin.      Marland Kitchen diclofenac sodium (VOLTAREN) 1 % GEL Apply topically.      . docusate sodium (COLACE) 100 MG capsule Take 100 mg by mouth 2 (two) times daily.      . montelukast (SINGULAIR) 10 MG tablet Take 10 mg by mouth at bedtime.      . multivitamin-iron-minerals-folic acid (CENTRUM) chewable tablet Chew 1 tablet by mouth daily.      . niacin (NIASPAN) 500 MG CR tablet Take 500 mg by mouth at bedtime.      Marland Kitchen olmesartan-hydrochlorothiazide (BENICAR HCT) 20-12.5 MG per tablet Take 1 tablet by mouth daily.      . Pitavastatin Calcium (LIVALO) 4 MG TABS Take by mouth.        No results found for this or any previous visit (from the past 48 hour(s)). No results found.  Review of Systems  Constitutional: Negative.   HENT: Negative.   Eyes: Negative.   Respiratory: Negative.   Cardiovascular: Negative.   Gastrointestinal: Negative.   Genitourinary: Negative.   Musculoskeletal:       Background osteoarthritis of multiple interphalangeal joints no numbness no triggering prior history of trauma  to right long finger nail region.  Skin: Negative.   Neurological: Negative.   Endo/Heme/Allergies: Negative.   Psychiatric/Behavioral: Negative.     Blood pressure 113/71, pulse 68, temperature 98.5 F (36.9 C), temperature source Oral, resp. rate 16, SpO2 96.00%. Physical Exam  Constitutional: She appears well-developed.  HENT:  Head: Normocephalic and atraumatic.  Eyes: Conjunctivae and EOM are normal. Pupils are equal, round, and reactive to light.  Neck: Normal range of motion. Neck supple.  Cardiovascular: Normal rate and regular rhythm.   Respiratory: Effort normal and breath sounds normal.  GI: Soft. Bowel sounds are normal.  Musculoskeletal: Normal range of motion.  Neurological: She is alert.  Skin: Skin is warm.  Psychiatric: She has a normal mood and affect.     Assessment/Plan Chronic mucoid cyst of right long finger nail fold. There is underlying osteoarthritis of the distal interphalangeal joint. Plan is to debride mucoid cyst and distal interphalangeal joint. Surgery aftercare risks and benefits have been discussed in detail. No guarantee as to recovery of the appearance of the nail has been made or implied. Questions were invited and answered in detail.  [name],Legacy Carrender V 01/05/2012, 7:43 AM

## 2012-01-05 NOTE — Op Note (Signed)
[id number] 

## 2012-01-06 ENCOUNTER — Encounter (HOSPITAL_BASED_OUTPATIENT_CLINIC_OR_DEPARTMENT_OTHER): Payer: Self-pay | Admitting: Orthopedic Surgery

## 2012-01-16 DIAGNOSIS — H5340 Unspecified visual field defects: Secondary | ICD-10-CM | POA: Diagnosis not present

## 2012-01-16 DIAGNOSIS — H33329 Round hole, unspecified eye: Secondary | ICD-10-CM | POA: Diagnosis not present

## 2012-01-16 DIAGNOSIS — H35039 Hypertensive retinopathy, unspecified eye: Secondary | ICD-10-CM | POA: Diagnosis not present

## 2012-01-16 DIAGNOSIS — H4310 Vitreous hemorrhage, unspecified eye: Secondary | ICD-10-CM | POA: Diagnosis not present

## 2012-01-16 DIAGNOSIS — H2510 Age-related nuclear cataract, unspecified eye: Secondary | ICD-10-CM | POA: Diagnosis not present

## 2012-01-24 DIAGNOSIS — M25559 Pain in unspecified hip: Secondary | ICD-10-CM | POA: Diagnosis not present

## 2012-01-24 DIAGNOSIS — G541 Lumbosacral plexus disorders: Secondary | ICD-10-CM | POA: Diagnosis not present

## 2012-01-24 DIAGNOSIS — H81399 Other peripheral vertigo, unspecified ear: Secondary | ICD-10-CM | POA: Diagnosis not present

## 2012-01-24 DIAGNOSIS — F341 Dysthymic disorder: Secondary | ICD-10-CM | POA: Diagnosis not present

## 2012-01-26 DIAGNOSIS — H35039 Hypertensive retinopathy, unspecified eye: Secondary | ICD-10-CM | POA: Diagnosis not present

## 2012-01-26 DIAGNOSIS — H2510 Age-related nuclear cataract, unspecified eye: Secondary | ICD-10-CM | POA: Diagnosis not present

## 2012-01-26 DIAGNOSIS — H4310 Vitreous hemorrhage, unspecified eye: Secondary | ICD-10-CM | POA: Diagnosis not present

## 2012-02-13 DIAGNOSIS — H43819 Vitreous degeneration, unspecified eye: Secondary | ICD-10-CM | POA: Diagnosis not present

## 2012-02-13 DIAGNOSIS — H35039 Hypertensive retinopathy, unspecified eye: Secondary | ICD-10-CM | POA: Diagnosis not present

## 2012-02-13 DIAGNOSIS — H4310 Vitreous hemorrhage, unspecified eye: Secondary | ICD-10-CM | POA: Diagnosis not present

## 2012-02-13 DIAGNOSIS — L659 Nonscarring hair loss, unspecified: Secondary | ICD-10-CM | POA: Diagnosis not present

## 2012-02-13 DIAGNOSIS — L439 Lichen planus, unspecified: Secondary | ICD-10-CM | POA: Diagnosis not present

## 2012-02-13 DIAGNOSIS — L680 Hirsutism: Secondary | ICD-10-CM | POA: Diagnosis not present

## 2012-04-02 DIAGNOSIS — Z23 Encounter for immunization: Secondary | ICD-10-CM | POA: Diagnosis not present

## 2012-04-24 DIAGNOSIS — F341 Dysthymic disorder: Secondary | ICD-10-CM | POA: Diagnosis not present

## 2012-04-24 DIAGNOSIS — G541 Lumbosacral plexus disorders: Secondary | ICD-10-CM | POA: Diagnosis not present

## 2012-04-24 DIAGNOSIS — M542 Cervicalgia: Secondary | ICD-10-CM | POA: Diagnosis not present

## 2012-04-24 DIAGNOSIS — H81399 Other peripheral vertigo, unspecified ear: Secondary | ICD-10-CM | POA: Diagnosis not present

## 2012-04-24 DIAGNOSIS — Z79899 Other long term (current) drug therapy: Secondary | ICD-10-CM | POA: Diagnosis not present

## 2012-04-24 DIAGNOSIS — M25559 Pain in unspecified hip: Secondary | ICD-10-CM | POA: Diagnosis not present

## 2012-05-10 DIAGNOSIS — H35379 Puckering of macula, unspecified eye: Secondary | ICD-10-CM | POA: Diagnosis not present

## 2012-05-10 DIAGNOSIS — H2510 Age-related nuclear cataract, unspecified eye: Secondary | ICD-10-CM | POA: Diagnosis not present

## 2012-05-10 DIAGNOSIS — H35039 Hypertensive retinopathy, unspecified eye: Secondary | ICD-10-CM | POA: Diagnosis not present

## 2012-05-10 DIAGNOSIS — H4310 Vitreous hemorrhage, unspecified eye: Secondary | ICD-10-CM | POA: Diagnosis not present

## 2012-05-15 DIAGNOSIS — E781 Pure hyperglyceridemia: Secondary | ICD-10-CM | POA: Diagnosis not present

## 2012-05-15 DIAGNOSIS — E119 Type 2 diabetes mellitus without complications: Secondary | ICD-10-CM | POA: Diagnosis not present

## 2012-05-15 DIAGNOSIS — R0602 Shortness of breath: Secondary | ICD-10-CM | POA: Diagnosis not present

## 2012-05-15 DIAGNOSIS — E559 Vitamin D deficiency, unspecified: Secondary | ICD-10-CM | POA: Diagnosis not present

## 2012-05-15 DIAGNOSIS — I10 Essential (primary) hypertension: Secondary | ICD-10-CM | POA: Diagnosis not present

## 2012-05-15 DIAGNOSIS — Z13820 Encounter for screening for osteoporosis: Secondary | ICD-10-CM | POA: Diagnosis not present

## 2012-06-21 DIAGNOSIS — E7800 Pure hypercholesterolemia, unspecified: Secondary | ICD-10-CM | POA: Diagnosis not present

## 2012-06-21 DIAGNOSIS — M1250 Traumatic arthropathy, unspecified site: Secondary | ICD-10-CM | POA: Diagnosis not present

## 2012-06-21 DIAGNOSIS — I10 Essential (primary) hypertension: Secondary | ICD-10-CM | POA: Diagnosis not present

## 2012-06-21 DIAGNOSIS — E1110 Type 2 diabetes mellitus with ketoacidosis without coma: Secondary | ICD-10-CM | POA: Diagnosis not present

## 2012-07-13 DIAGNOSIS — H81399 Other peripheral vertigo, unspecified ear: Secondary | ICD-10-CM | POA: Diagnosis not present

## 2012-07-13 DIAGNOSIS — G541 Lumbosacral plexus disorders: Secondary | ICD-10-CM | POA: Diagnosis not present

## 2012-07-13 DIAGNOSIS — F341 Dysthymic disorder: Secondary | ICD-10-CM | POA: Diagnosis not present

## 2012-07-13 DIAGNOSIS — M25559 Pain in unspecified hip: Secondary | ICD-10-CM | POA: Diagnosis not present

## 2012-07-30 DIAGNOSIS — Z1231 Encounter for screening mammogram for malignant neoplasm of breast: Secondary | ICD-10-CM | POA: Diagnosis not present

## 2012-11-06 DIAGNOSIS — H35039 Hypertensive retinopathy, unspecified eye: Secondary | ICD-10-CM | POA: Diagnosis not present

## 2012-11-06 DIAGNOSIS — H2510 Age-related nuclear cataract, unspecified eye: Secondary | ICD-10-CM | POA: Diagnosis not present

## 2012-11-06 DIAGNOSIS — H40019 Open angle with borderline findings, low risk, unspecified eye: Secondary | ICD-10-CM | POA: Diagnosis not present

## 2012-11-08 DIAGNOSIS — H35039 Hypertensive retinopathy, unspecified eye: Secondary | ICD-10-CM | POA: Diagnosis not present

## 2012-11-08 DIAGNOSIS — H2510 Age-related nuclear cataract, unspecified eye: Secondary | ICD-10-CM | POA: Diagnosis not present

## 2012-11-08 DIAGNOSIS — H35379 Puckering of macula, unspecified eye: Secondary | ICD-10-CM | POA: Diagnosis not present

## 2012-11-08 DIAGNOSIS — H43819 Vitreous degeneration, unspecified eye: Secondary | ICD-10-CM | POA: Diagnosis not present

## 2012-11-13 DIAGNOSIS — F341 Dysthymic disorder: Secondary | ICD-10-CM | POA: Diagnosis not present

## 2012-11-13 DIAGNOSIS — Z79899 Other long term (current) drug therapy: Secondary | ICD-10-CM | POA: Diagnosis not present

## 2012-11-13 DIAGNOSIS — M542 Cervicalgia: Secondary | ICD-10-CM | POA: Diagnosis not present

## 2012-11-13 DIAGNOSIS — G541 Lumbosacral plexus disorders: Secondary | ICD-10-CM | POA: Diagnosis not present

## 2012-11-13 DIAGNOSIS — M25559 Pain in unspecified hip: Secondary | ICD-10-CM | POA: Diagnosis not present

## 2012-11-13 DIAGNOSIS — H8190 Unspecified disorder of vestibular function, unspecified ear: Secondary | ICD-10-CM | POA: Diagnosis not present

## 2012-11-13 DIAGNOSIS — H81399 Other peripheral vertigo, unspecified ear: Secondary | ICD-10-CM | POA: Diagnosis not present

## 2012-11-15 DIAGNOSIS — I10 Essential (primary) hypertension: Secondary | ICD-10-CM | POA: Diagnosis not present

## 2012-11-15 DIAGNOSIS — E119 Type 2 diabetes mellitus without complications: Secondary | ICD-10-CM | POA: Diagnosis not present

## 2012-11-26 DIAGNOSIS — H1045 Other chronic allergic conjunctivitis: Secondary | ICD-10-CM | POA: Diagnosis not present

## 2012-11-26 DIAGNOSIS — R059 Cough, unspecified: Secondary | ICD-10-CM | POA: Diagnosis not present

## 2012-11-26 DIAGNOSIS — J309 Allergic rhinitis, unspecified: Secondary | ICD-10-CM | POA: Diagnosis not present

## 2012-11-26 DIAGNOSIS — R05 Cough: Secondary | ICD-10-CM | POA: Diagnosis not present

## 2012-12-04 DIAGNOSIS — G4752 REM sleep behavior disorder: Secondary | ICD-10-CM | POA: Diagnosis not present

## 2012-12-04 DIAGNOSIS — G8929 Other chronic pain: Secondary | ICD-10-CM | POA: Diagnosis not present

## 2012-12-04 DIAGNOSIS — I10 Essential (primary) hypertension: Secondary | ICD-10-CM | POA: Diagnosis not present

## 2012-12-04 DIAGNOSIS — E7800 Pure hypercholesterolemia, unspecified: Secondary | ICD-10-CM | POA: Diagnosis not present

## 2012-12-04 DIAGNOSIS — M1250 Traumatic arthropathy, unspecified site: Secondary | ICD-10-CM | POA: Diagnosis not present

## 2012-12-19 DIAGNOSIS — H6120 Impacted cerumen, unspecified ear: Secondary | ICD-10-CM | POA: Diagnosis not present

## 2012-12-19 DIAGNOSIS — H9209 Otalgia, unspecified ear: Secondary | ICD-10-CM | POA: Diagnosis not present

## 2013-01-25 DIAGNOSIS — H40019 Open angle with borderline findings, low risk, unspecified eye: Secondary | ICD-10-CM | POA: Diagnosis not present

## 2013-01-28 ENCOUNTER — Institutional Professional Consult (permissible substitution): Payer: PRIVATE HEALTH INSURANCE | Admitting: Internal Medicine

## 2013-01-31 DIAGNOSIS — J309 Allergic rhinitis, unspecified: Secondary | ICD-10-CM | POA: Diagnosis not present

## 2013-01-31 DIAGNOSIS — R05 Cough: Secondary | ICD-10-CM | POA: Diagnosis not present

## 2013-01-31 DIAGNOSIS — R059 Cough, unspecified: Secondary | ICD-10-CM | POA: Diagnosis not present

## 2013-02-07 DIAGNOSIS — H8190 Unspecified disorder of vestibular function, unspecified ear: Secondary | ICD-10-CM | POA: Diagnosis not present

## 2013-02-07 DIAGNOSIS — M545 Low back pain, unspecified: Secondary | ICD-10-CM | POA: Diagnosis not present

## 2013-02-07 DIAGNOSIS — G541 Lumbosacral plexus disorders: Secondary | ICD-10-CM | POA: Diagnosis not present

## 2013-02-07 DIAGNOSIS — H81399 Other peripheral vertigo, unspecified ear: Secondary | ICD-10-CM | POA: Diagnosis not present

## 2013-02-07 DIAGNOSIS — M25559 Pain in unspecified hip: Secondary | ICD-10-CM | POA: Diagnosis not present

## 2013-03-26 DIAGNOSIS — K648 Other hemorrhoids: Secondary | ICD-10-CM | POA: Diagnosis not present

## 2013-03-26 DIAGNOSIS — E119 Type 2 diabetes mellitus without complications: Secondary | ICD-10-CM | POA: Diagnosis not present

## 2013-03-26 DIAGNOSIS — K625 Hemorrhage of anus and rectum: Secondary | ICD-10-CM | POA: Diagnosis not present

## 2013-03-26 DIAGNOSIS — Z1211 Encounter for screening for malignant neoplasm of colon: Secondary | ICD-10-CM | POA: Diagnosis not present

## 2013-03-26 DIAGNOSIS — Z86010 Personal history of colonic polyps: Secondary | ICD-10-CM | POA: Diagnosis not present

## 2013-03-26 DIAGNOSIS — I10 Essential (primary) hypertension: Secondary | ICD-10-CM | POA: Diagnosis not present

## 2013-04-02 DIAGNOSIS — I10 Essential (primary) hypertension: Secondary | ICD-10-CM | POA: Diagnosis not present

## 2013-04-02 DIAGNOSIS — Z23 Encounter for immunization: Secondary | ICD-10-CM | POA: Diagnosis not present

## 2013-04-02 DIAGNOSIS — E7800 Pure hypercholesterolemia, unspecified: Secondary | ICD-10-CM | POA: Diagnosis not present

## 2013-04-02 DIAGNOSIS — E119 Type 2 diabetes mellitus without complications: Secondary | ICD-10-CM | POA: Diagnosis not present

## 2013-04-08 DIAGNOSIS — L738 Other specified follicular disorders: Secondary | ICD-10-CM | POA: Diagnosis not present

## 2013-04-08 DIAGNOSIS — L678 Other hair color and hair shaft abnormalities: Secondary | ICD-10-CM | POA: Diagnosis not present

## 2013-04-08 DIAGNOSIS — L680 Hirsutism: Secondary | ICD-10-CM | POA: Diagnosis not present

## 2013-04-08 DIAGNOSIS — L659 Nonscarring hair loss, unspecified: Secondary | ICD-10-CM | POA: Diagnosis not present

## 2013-04-08 DIAGNOSIS — L439 Lichen planus, unspecified: Secondary | ICD-10-CM | POA: Diagnosis not present

## 2013-05-22 DIAGNOSIS — K625 Hemorrhage of anus and rectum: Secondary | ICD-10-CM | POA: Diagnosis not present

## 2013-05-22 DIAGNOSIS — D126 Benign neoplasm of colon, unspecified: Secondary | ICD-10-CM | POA: Diagnosis not present

## 2013-05-22 DIAGNOSIS — Z86010 Personal history of colonic polyps: Secondary | ICD-10-CM | POA: Diagnosis not present

## 2013-05-22 DIAGNOSIS — Z1211 Encounter for screening for malignant neoplasm of colon: Secondary | ICD-10-CM | POA: Diagnosis not present

## 2013-06-24 DIAGNOSIS — J069 Acute upper respiratory infection, unspecified: Secondary | ICD-10-CM | POA: Diagnosis not present

## 2013-06-27 DIAGNOSIS — R141 Gas pain: Secondary | ICD-10-CM | POA: Diagnosis not present

## 2013-06-27 DIAGNOSIS — K5730 Diverticulosis of large intestine without perforation or abscess without bleeding: Secondary | ICD-10-CM | POA: Diagnosis not present

## 2013-06-27 DIAGNOSIS — R1031 Right lower quadrant pain: Secondary | ICD-10-CM | POA: Diagnosis not present

## 2013-07-16 DIAGNOSIS — I10 Essential (primary) hypertension: Secondary | ICD-10-CM | POA: Diagnosis not present

## 2013-08-05 DIAGNOSIS — Z803 Family history of malignant neoplasm of breast: Secondary | ICD-10-CM | POA: Diagnosis not present

## 2013-08-05 DIAGNOSIS — Z1231 Encounter for screening mammogram for malignant neoplasm of breast: Secondary | ICD-10-CM | POA: Diagnosis not present

## 2013-08-12 DIAGNOSIS — L678 Other hair color and hair shaft abnormalities: Secondary | ICD-10-CM | POA: Diagnosis not present

## 2013-08-12 DIAGNOSIS — L738 Other specified follicular disorders: Secondary | ICD-10-CM | POA: Diagnosis not present

## 2013-08-12 DIAGNOSIS — L658 Other specified nonscarring hair loss: Secondary | ICD-10-CM | POA: Diagnosis not present

## 2013-08-12 DIAGNOSIS — L659 Nonscarring hair loss, unspecified: Secondary | ICD-10-CM | POA: Diagnosis not present

## 2013-08-12 DIAGNOSIS — L680 Hirsutism: Secondary | ICD-10-CM | POA: Diagnosis not present

## 2013-08-17 DIAGNOSIS — I10 Essential (primary) hypertension: Secondary | ICD-10-CM | POA: Diagnosis not present

## 2013-08-17 DIAGNOSIS — E119 Type 2 diabetes mellitus without complications: Secondary | ICD-10-CM | POA: Diagnosis not present

## 2013-08-17 DIAGNOSIS — J069 Acute upper respiratory infection, unspecified: Secondary | ICD-10-CM | POA: Diagnosis not present

## 2013-08-29 DIAGNOSIS — R05 Cough: Secondary | ICD-10-CM | POA: Diagnosis not present

## 2013-08-29 DIAGNOSIS — J309 Allergic rhinitis, unspecified: Secondary | ICD-10-CM | POA: Diagnosis not present

## 2013-08-29 DIAGNOSIS — R059 Cough, unspecified: Secondary | ICD-10-CM | POA: Diagnosis not present

## 2013-09-09 DIAGNOSIS — J310 Chronic rhinitis: Secondary | ICD-10-CM | POA: Diagnosis not present

## 2013-09-09 DIAGNOSIS — R490 Dysphonia: Secondary | ICD-10-CM | POA: Diagnosis not present

## 2013-09-09 DIAGNOSIS — R05 Cough: Secondary | ICD-10-CM | POA: Diagnosis not present

## 2013-09-09 DIAGNOSIS — J45909 Unspecified asthma, uncomplicated: Secondary | ICD-10-CM | POA: Diagnosis not present

## 2013-09-09 DIAGNOSIS — K219 Gastro-esophageal reflux disease without esophagitis: Secondary | ICD-10-CM | POA: Diagnosis not present

## 2013-09-09 DIAGNOSIS — R059 Cough, unspecified: Secondary | ICD-10-CM | POA: Diagnosis not present

## 2013-09-13 DIAGNOSIS — M719 Bursopathy, unspecified: Secondary | ICD-10-CM | POA: Diagnosis not present

## 2013-09-13 DIAGNOSIS — M67919 Unspecified disorder of synovium and tendon, unspecified shoulder: Secondary | ICD-10-CM | POA: Diagnosis not present

## 2013-09-13 DIAGNOSIS — M1710 Unilateral primary osteoarthritis, unspecified knee: Secondary | ICD-10-CM | POA: Diagnosis not present

## 2013-10-08 DIAGNOSIS — M5137 Other intervertebral disc degeneration, lumbosacral region: Secondary | ICD-10-CM | POA: Diagnosis not present

## 2013-10-08 DIAGNOSIS — M961 Postlaminectomy syndrome, not elsewhere classified: Secondary | ICD-10-CM | POA: Diagnosis not present

## 2013-10-08 DIAGNOSIS — M545 Low back pain, unspecified: Secondary | ICD-10-CM | POA: Diagnosis not present

## 2013-10-08 DIAGNOSIS — M5030 Other cervical disc degeneration, unspecified cervical region: Secondary | ICD-10-CM | POA: Diagnosis not present

## 2013-10-08 DIAGNOSIS — R42 Dizziness and giddiness: Secondary | ICD-10-CM | POA: Diagnosis not present

## 2013-10-08 DIAGNOSIS — G9009 Other idiopathic peripheral autonomic neuropathy: Secondary | ICD-10-CM | POA: Diagnosis not present

## 2013-10-08 DIAGNOSIS — G541 Lumbosacral plexus disorders: Secondary | ICD-10-CM | POA: Diagnosis not present

## 2013-10-23 DIAGNOSIS — J309 Allergic rhinitis, unspecified: Secondary | ICD-10-CM | POA: Diagnosis not present

## 2013-10-23 DIAGNOSIS — R05 Cough: Secondary | ICD-10-CM | POA: Diagnosis not present

## 2013-10-23 DIAGNOSIS — R059 Cough, unspecified: Secondary | ICD-10-CM | POA: Diagnosis not present

## 2013-10-24 DIAGNOSIS — H6120 Impacted cerumen, unspecified ear: Secondary | ICD-10-CM | POA: Diagnosis not present

## 2013-12-14 DIAGNOSIS — I10 Essential (primary) hypertension: Secondary | ICD-10-CM | POA: Diagnosis not present

## 2013-12-14 DIAGNOSIS — E119 Type 2 diabetes mellitus without complications: Secondary | ICD-10-CM | POA: Diagnosis not present

## 2013-12-16 DIAGNOSIS — H40019 Open angle with borderline findings, low risk, unspecified eye: Secondary | ICD-10-CM | POA: Diagnosis not present

## 2013-12-16 DIAGNOSIS — H2510 Age-related nuclear cataract, unspecified eye: Secondary | ICD-10-CM | POA: Diagnosis not present

## 2013-12-16 DIAGNOSIS — H524 Presbyopia: Secondary | ICD-10-CM | POA: Diagnosis not present

## 2013-12-16 DIAGNOSIS — H35039 Hypertensive retinopathy, unspecified eye: Secondary | ICD-10-CM | POA: Diagnosis not present

## 2013-12-17 DIAGNOSIS — R51 Headache: Secondary | ICD-10-CM | POA: Diagnosis not present

## 2013-12-17 DIAGNOSIS — E119 Type 2 diabetes mellitus without complications: Secondary | ICD-10-CM | POA: Diagnosis not present

## 2013-12-17 DIAGNOSIS — E7800 Pure hypercholesterolemia, unspecified: Secondary | ICD-10-CM | POA: Diagnosis not present

## 2013-12-17 DIAGNOSIS — I10 Essential (primary) hypertension: Secondary | ICD-10-CM | POA: Diagnosis not present

## 2013-12-26 ENCOUNTER — Other Ambulatory Visit: Payer: Self-pay | Admitting: Specialist

## 2013-12-26 DIAGNOSIS — Z049 Encounter for examination and observation for unspecified reason: Secondary | ICD-10-CM | POA: Diagnosis not present

## 2013-12-26 DIAGNOSIS — R51 Headache: Secondary | ICD-10-CM | POA: Diagnosis not present

## 2013-12-26 DIAGNOSIS — R519 Headache, unspecified: Secondary | ICD-10-CM

## 2013-12-26 DIAGNOSIS — Z79899 Other long term (current) drug therapy: Secondary | ICD-10-CM | POA: Diagnosis not present

## 2013-12-31 DIAGNOSIS — G518 Other disorders of facial nerve: Secondary | ICD-10-CM | POA: Diagnosis not present

## 2013-12-31 DIAGNOSIS — R51 Headache: Secondary | ICD-10-CM | POA: Diagnosis not present

## 2013-12-31 DIAGNOSIS — M542 Cervicalgia: Secondary | ICD-10-CM | POA: Diagnosis not present

## 2013-12-31 DIAGNOSIS — IMO0001 Reserved for inherently not codable concepts without codable children: Secondary | ICD-10-CM | POA: Diagnosis not present

## 2014-01-02 ENCOUNTER — Other Ambulatory Visit: Payer: Self-pay | Admitting: Specialist

## 2014-01-02 ENCOUNTER — Ambulatory Visit
Admission: RE | Admit: 2014-01-02 | Discharge: 2014-01-02 | Disposition: A | Discharge status: Home / Self Care | Payer: Medicare Other | Source: Ambulatory Visit | Attending: Specialist | Admitting: Specialist

## 2014-01-02 DIAGNOSIS — R519 Headache, unspecified: Secondary | ICD-10-CM

## 2014-01-02 DIAGNOSIS — R51 Headache: Principal | ICD-10-CM

## 2014-01-07 DIAGNOSIS — M533 Sacrococcygeal disorders, not elsewhere classified: Secondary | ICD-10-CM | POA: Diagnosis not present

## 2014-01-07 DIAGNOSIS — G894 Chronic pain syndrome: Secondary | ICD-10-CM | POA: Diagnosis not present

## 2014-01-07 DIAGNOSIS — M545 Low back pain, unspecified: Secondary | ICD-10-CM | POA: Diagnosis not present

## 2014-01-07 DIAGNOSIS — M5137 Other intervertebral disc degeneration, lumbosacral region: Secondary | ICD-10-CM | POA: Diagnosis not present

## 2014-01-07 DIAGNOSIS — Z79899 Other long term (current) drug therapy: Secondary | ICD-10-CM | POA: Diagnosis not present

## 2014-01-09 DIAGNOSIS — M25569 Pain in unspecified knee: Secondary | ICD-10-CM | POA: Diagnosis not present

## 2014-01-14 DIAGNOSIS — IMO0001 Reserved for inherently not codable concepts without codable children: Secondary | ICD-10-CM | POA: Diagnosis not present

## 2014-01-14 DIAGNOSIS — G518 Other disorders of facial nerve: Secondary | ICD-10-CM | POA: Diagnosis not present

## 2014-01-14 DIAGNOSIS — M542 Cervicalgia: Secondary | ICD-10-CM | POA: Diagnosis not present

## 2014-01-14 DIAGNOSIS — R51 Headache: Secondary | ICD-10-CM | POA: Diagnosis not present

## 2014-01-23 DIAGNOSIS — M1710 Unilateral primary osteoarthritis, unspecified knee: Secondary | ICD-10-CM | POA: Diagnosis not present

## 2014-01-23 DIAGNOSIS — I10 Essential (primary) hypertension: Secondary | ICD-10-CM | POA: Diagnosis not present

## 2014-01-28 DIAGNOSIS — G518 Other disorders of facial nerve: Secondary | ICD-10-CM | POA: Diagnosis not present

## 2014-01-28 DIAGNOSIS — IMO0001 Reserved for inherently not codable concepts without codable children: Secondary | ICD-10-CM | POA: Diagnosis not present

## 2014-01-28 DIAGNOSIS — M542 Cervicalgia: Secondary | ICD-10-CM | POA: Diagnosis not present

## 2014-01-28 DIAGNOSIS — R51 Headache: Secondary | ICD-10-CM | POA: Diagnosis not present

## 2014-02-10 DIAGNOSIS — L659 Nonscarring hair loss, unspecified: Secondary | ICD-10-CM | POA: Diagnosis not present

## 2014-02-10 DIAGNOSIS — L680 Hirsutism: Secondary | ICD-10-CM | POA: Diagnosis not present

## 2014-02-10 DIAGNOSIS — L658 Other specified nonscarring hair loss: Secondary | ICD-10-CM | POA: Diagnosis not present

## 2014-02-11 DIAGNOSIS — IMO0001 Reserved for inherently not codable concepts without codable children: Secondary | ICD-10-CM | POA: Diagnosis not present

## 2014-02-11 DIAGNOSIS — G518 Other disorders of facial nerve: Secondary | ICD-10-CM | POA: Diagnosis not present

## 2014-02-11 DIAGNOSIS — R51 Headache: Secondary | ICD-10-CM | POA: Diagnosis not present

## 2014-02-11 DIAGNOSIS — M542 Cervicalgia: Secondary | ICD-10-CM | POA: Diagnosis not present

## 2014-02-13 DIAGNOSIS — M719 Bursopathy, unspecified: Secondary | ICD-10-CM | POA: Diagnosis not present

## 2014-02-13 DIAGNOSIS — M67919 Unspecified disorder of synovium and tendon, unspecified shoulder: Secondary | ICD-10-CM | POA: Diagnosis not present

## 2014-02-13 DIAGNOSIS — M7500 Adhesive capsulitis of unspecified shoulder: Secondary | ICD-10-CM | POA: Diagnosis not present

## 2014-02-26 DIAGNOSIS — R059 Cough, unspecified: Secondary | ICD-10-CM | POA: Diagnosis not present

## 2014-02-26 DIAGNOSIS — R05 Cough: Secondary | ICD-10-CM | POA: Diagnosis not present

## 2014-02-26 DIAGNOSIS — H1045 Other chronic allergic conjunctivitis: Secondary | ICD-10-CM | POA: Diagnosis not present

## 2014-02-26 DIAGNOSIS — J309 Allergic rhinitis, unspecified: Secondary | ICD-10-CM | POA: Diagnosis not present

## 2014-03-11 DIAGNOSIS — G518 Other disorders of facial nerve: Secondary | ICD-10-CM | POA: Diagnosis not present

## 2014-03-11 DIAGNOSIS — R51 Headache: Secondary | ICD-10-CM | POA: Diagnosis not present

## 2014-03-11 DIAGNOSIS — M542 Cervicalgia: Secondary | ICD-10-CM | POA: Diagnosis not present

## 2014-03-11 DIAGNOSIS — IMO0001 Reserved for inherently not codable concepts without codable children: Secondary | ICD-10-CM | POA: Diagnosis not present

## 2014-04-07 DIAGNOSIS — E1149 Type 2 diabetes mellitus with other diabetic neurological complication: Secondary | ICD-10-CM | POA: Diagnosis not present

## 2014-04-08 DIAGNOSIS — I10 Essential (primary) hypertension: Secondary | ICD-10-CM | POA: Diagnosis not present

## 2014-04-08 DIAGNOSIS — E119 Type 2 diabetes mellitus without complications: Secondary | ICD-10-CM | POA: Diagnosis not present

## 2014-04-08 DIAGNOSIS — Z23 Encounter for immunization: Secondary | ICD-10-CM | POA: Diagnosis not present

## 2014-04-08 DIAGNOSIS — E780 Pure hypercholesterolemia: Secondary | ICD-10-CM | POA: Diagnosis not present

## 2014-04-15 DIAGNOSIS — Z0000 Encounter for general adult medical examination without abnormal findings: Secondary | ICD-10-CM | POA: Diagnosis not present

## 2014-04-15 DIAGNOSIS — Z23 Encounter for immunization: Secondary | ICD-10-CM | POA: Diagnosis not present

## 2014-04-22 DIAGNOSIS — G43719 Chronic migraine without aura, intractable, without status migrainosus: Secondary | ICD-10-CM | POA: Diagnosis not present

## 2014-05-06 DIAGNOSIS — M25561 Pain in right knee: Secondary | ICD-10-CM | POA: Diagnosis not present

## 2014-05-06 DIAGNOSIS — M25562 Pain in left knee: Secondary | ICD-10-CM | POA: Diagnosis not present

## 2014-05-06 DIAGNOSIS — Z79899 Other long term (current) drug therapy: Secondary | ICD-10-CM | POA: Diagnosis not present

## 2014-05-06 DIAGNOSIS — G8929 Other chronic pain: Secondary | ICD-10-CM | POA: Diagnosis not present

## 2014-05-19 DIAGNOSIS — M810 Age-related osteoporosis without current pathological fracture: Secondary | ICD-10-CM | POA: Diagnosis not present

## 2014-05-27 DIAGNOSIS — M1711 Unilateral primary osteoarthritis, right knee: Secondary | ICD-10-CM | POA: Diagnosis not present

## 2014-07-07 DIAGNOSIS — H40019 Open angle with borderline findings, low risk, unspecified eye: Secondary | ICD-10-CM | POA: Diagnosis not present

## 2014-07-29 DIAGNOSIS — G43719 Chronic migraine without aura, intractable, without status migrainosus: Secondary | ICD-10-CM | POA: Diagnosis not present

## 2014-08-07 DIAGNOSIS — Z803 Family history of malignant neoplasm of breast: Secondary | ICD-10-CM | POA: Diagnosis not present

## 2014-08-07 DIAGNOSIS — Z1231 Encounter for screening mammogram for malignant neoplasm of breast: Secondary | ICD-10-CM | POA: Diagnosis not present

## 2014-08-18 DIAGNOSIS — L669 Cicatricial alopecia, unspecified: Secondary | ICD-10-CM | POA: Diagnosis not present

## 2014-08-18 DIAGNOSIS — L680 Hirsutism: Secondary | ICD-10-CM | POA: Diagnosis not present

## 2014-08-21 DIAGNOSIS — M25561 Pain in right knee: Secondary | ICD-10-CM | POA: Diagnosis not present

## 2014-08-21 DIAGNOSIS — M25512 Pain in left shoulder: Secondary | ICD-10-CM | POA: Diagnosis not present

## 2014-08-21 DIAGNOSIS — M25562 Pain in left knee: Secondary | ICD-10-CM | POA: Diagnosis not present

## 2014-08-21 DIAGNOSIS — M545 Low back pain: Secondary | ICD-10-CM | POA: Diagnosis not present

## 2014-09-04 DIAGNOSIS — J309 Allergic rhinitis, unspecified: Secondary | ICD-10-CM | POA: Diagnosis not present

## 2014-09-04 DIAGNOSIS — R05 Cough: Secondary | ICD-10-CM | POA: Diagnosis not present

## 2014-09-08 DIAGNOSIS — I10 Essential (primary) hypertension: Secondary | ICD-10-CM | POA: Diagnosis not present

## 2014-09-08 DIAGNOSIS — E1149 Type 2 diabetes mellitus with other diabetic neurological complication: Secondary | ICD-10-CM | POA: Diagnosis not present

## 2014-09-09 DIAGNOSIS — E780 Pure hypercholesterolemia: Secondary | ICD-10-CM | POA: Diagnosis not present

## 2014-09-09 DIAGNOSIS — E119 Type 2 diabetes mellitus without complications: Secondary | ICD-10-CM | POA: Diagnosis not present

## 2014-09-09 DIAGNOSIS — H6123 Impacted cerumen, bilateral: Secondary | ICD-10-CM | POA: Diagnosis not present

## 2014-09-09 DIAGNOSIS — I10 Essential (primary) hypertension: Secondary | ICD-10-CM | POA: Diagnosis not present

## 2014-10-30 DIAGNOSIS — G43719 Chronic migraine without aura, intractable, without status migrainosus: Secondary | ICD-10-CM | POA: Diagnosis not present

## 2014-12-09 DIAGNOSIS — M546 Pain in thoracic spine: Secondary | ICD-10-CM | POA: Diagnosis not present

## 2014-12-09 DIAGNOSIS — G890 Central pain syndrome: Secondary | ICD-10-CM | POA: Diagnosis not present

## 2014-12-09 DIAGNOSIS — R202 Paresthesia of skin: Secondary | ICD-10-CM | POA: Diagnosis not present

## 2014-12-09 DIAGNOSIS — M545 Low back pain: Secondary | ICD-10-CM | POA: Diagnosis not present

## 2015-02-11 DIAGNOSIS — E1149 Type 2 diabetes mellitus with other diabetic neurological complication: Secondary | ICD-10-CM | POA: Diagnosis not present

## 2015-02-11 DIAGNOSIS — I10 Essential (primary) hypertension: Secondary | ICD-10-CM | POA: Diagnosis not present

## 2015-02-12 DIAGNOSIS — E119 Type 2 diabetes mellitus without complications: Secondary | ICD-10-CM | POA: Diagnosis not present

## 2015-02-12 DIAGNOSIS — I10 Essential (primary) hypertension: Secondary | ICD-10-CM | POA: Diagnosis not present

## 2015-02-24 DIAGNOSIS — H35033 Hypertensive retinopathy, bilateral: Secondary | ICD-10-CM | POA: Diagnosis not present

## 2015-02-24 DIAGNOSIS — H2512 Age-related nuclear cataract, left eye: Secondary | ICD-10-CM | POA: Diagnosis not present

## 2015-02-24 DIAGNOSIS — H40019 Open angle with borderline findings, low risk, unspecified eye: Secondary | ICD-10-CM | POA: Diagnosis not present

## 2015-03-03 DIAGNOSIS — G43719 Chronic migraine without aura, intractable, without status migrainosus: Secondary | ICD-10-CM | POA: Diagnosis not present

## 2015-03-05 DIAGNOSIS — J309 Allergic rhinitis, unspecified: Secondary | ICD-10-CM | POA: Diagnosis not present

## 2015-03-05 DIAGNOSIS — R05 Cough: Secondary | ICD-10-CM | POA: Diagnosis not present

## 2015-03-10 DIAGNOSIS — G890 Central pain syndrome: Secondary | ICD-10-CM | POA: Diagnosis not present

## 2015-03-10 DIAGNOSIS — M542 Cervicalgia: Secondary | ICD-10-CM | POA: Diagnosis not present

## 2015-03-10 DIAGNOSIS — M545 Low back pain: Secondary | ICD-10-CM | POA: Diagnosis not present

## 2015-03-26 DIAGNOSIS — Z23 Encounter for immunization: Secondary | ICD-10-CM | POA: Diagnosis not present

## 2015-05-12 DIAGNOSIS — M25562 Pain in left knee: Secondary | ICD-10-CM | POA: Diagnosis not present

## 2015-05-12 DIAGNOSIS — M545 Low back pain: Secondary | ICD-10-CM | POA: Diagnosis not present

## 2015-05-12 DIAGNOSIS — M25561 Pain in right knee: Secondary | ICD-10-CM | POA: Diagnosis not present

## 2015-05-25 DIAGNOSIS — L659 Nonscarring hair loss, unspecified: Secondary | ICD-10-CM | POA: Diagnosis not present

## 2015-05-25 DIAGNOSIS — L680 Hirsutism: Secondary | ICD-10-CM | POA: Diagnosis not present

## 2015-05-25 DIAGNOSIS — L669 Cicatricial alopecia, unspecified: Secondary | ICD-10-CM | POA: Diagnosis not present

## 2015-05-25 DIAGNOSIS — L689 Hypertrichosis, unspecified: Secondary | ICD-10-CM | POA: Diagnosis not present

## 2015-05-25 DIAGNOSIS — L089 Local infection of the skin and subcutaneous tissue, unspecified: Secondary | ICD-10-CM | POA: Diagnosis not present

## 2015-07-22 DIAGNOSIS — G894 Chronic pain syndrome: Secondary | ICD-10-CM | POA: Diagnosis not present

## 2015-07-22 DIAGNOSIS — G890 Central pain syndrome: Secondary | ICD-10-CM | POA: Diagnosis not present

## 2015-07-22 DIAGNOSIS — G8929 Other chronic pain: Secondary | ICD-10-CM | POA: Diagnosis not present

## 2015-07-27 DIAGNOSIS — I10 Essential (primary) hypertension: Secondary | ICD-10-CM | POA: Diagnosis not present

## 2015-07-27 DIAGNOSIS — E119 Type 2 diabetes mellitus without complications: Secondary | ICD-10-CM | POA: Diagnosis not present

## 2015-07-27 DIAGNOSIS — E7800 Pure hypercholesterolemia, unspecified: Secondary | ICD-10-CM | POA: Diagnosis not present

## 2015-07-29 DIAGNOSIS — M542 Cervicalgia: Secondary | ICD-10-CM | POA: Diagnosis not present

## 2015-07-29 DIAGNOSIS — E119 Type 2 diabetes mellitus without complications: Secondary | ICD-10-CM | POA: Diagnosis not present

## 2015-08-10 DIAGNOSIS — J343 Hypertrophy of nasal turbinates: Secondary | ICD-10-CM | POA: Diagnosis not present

## 2015-08-10 DIAGNOSIS — H6123 Impacted cerumen, bilateral: Secondary | ICD-10-CM | POA: Diagnosis not present

## 2015-08-10 DIAGNOSIS — J302 Other seasonal allergic rhinitis: Secondary | ICD-10-CM | POA: Diagnosis not present

## 2015-08-10 DIAGNOSIS — H9193 Unspecified hearing loss, bilateral: Secondary | ICD-10-CM | POA: Diagnosis not present

## 2015-08-11 DIAGNOSIS — Z803 Family history of malignant neoplasm of breast: Secondary | ICD-10-CM | POA: Diagnosis not present

## 2015-08-11 DIAGNOSIS — Z1231 Encounter for screening mammogram for malignant neoplasm of breast: Secondary | ICD-10-CM | POA: Diagnosis not present

## 2015-08-25 DIAGNOSIS — H35033 Hypertensive retinopathy, bilateral: Secondary | ICD-10-CM | POA: Diagnosis not present

## 2015-08-26 ENCOUNTER — Ambulatory Visit (INDEPENDENT_AMBULATORY_CARE_PROVIDER_SITE_OTHER): Payer: Medicare Other | Admitting: Allergy and Immunology

## 2015-08-26 ENCOUNTER — Encounter: Payer: Self-pay | Admitting: Allergy and Immunology

## 2015-08-26 VITALS — BP 120/85 | HR 85 | Temp 97.7°F | Resp 17

## 2015-08-26 DIAGNOSIS — J310 Chronic rhinitis: Secondary | ICD-10-CM | POA: Diagnosis not present

## 2015-08-26 DIAGNOSIS — R05 Cough: Secondary | ICD-10-CM | POA: Diagnosis not present

## 2015-08-26 DIAGNOSIS — R059 Cough, unspecified: Secondary | ICD-10-CM

## 2015-08-26 MED ORDER — MONTELUKAST SODIUM 10 MG PO TABS: 10.0000 mg | ORAL_TABLET | Freq: Every day | ORAL | Status: DC

## 2015-08-26 MED ORDER — BECLOMETHASONE DIPROPIONATE 80 MCG/ACT NA AERS: INHALATION_SPRAY | NASAL | Status: DC

## 2015-08-26 NOTE — Patient Instructions (Signed)
  Continue Singulair 10 mg daily.  Continue Qnasl 1-2 sprays once daily.  Zyrtec 10 mg once daily as needed.  Astepro 1-2 sprays once daily as needed.  Saline nasal wash prior to medicated sprays as needed throughout the day.  Xopenex as needed.  Call with persisting questions or concerns And follow-up in 6-9 months or sooner if needed.

## 2015-08-26 NOTE — Progress Notes (Signed)
     FOLLOW UP NOTE  RE: [name] MRN: [id number] DOB: [birth year] [facility] [address] [place] [address] Date of Office Visit: 08/26/2015  Subjective:  [name] is a 75 y.o. female who presents today for Nasal Congestion and Headache  Assessment:   1. Cough, appears multifactorial with significant postnasal drip component.    2. Mixed rhinitis.   3.      No previous benefit of PPI. Plan:   Meds ordered this encounter  Medications  . montelukast (SINGULAIR) 10 MG tablet    Sig: Take 1 tablet (10 mg total) by mouth at bedtime.    Dispense:  30 tablet    Refill:  5  . Beclomethasone Dipropionate (QNASL) 80 MCG/ACT AERS    Sig: 1-2 sprays into each nostril daily.    Dispense:  1 Inhaler    Refill:  5   Patient Instructions  1.  Continue Singulair 10 mg daily. 2.  Continue Qnasl 1-2 sprays once daily. 3.  Zyrtec 10 mg once daily as needed. 4.  Astepro 1-2 sprays once daily as needed. 5.  Saline nasal wash prior to medicated sprays as needed throughout the day. 6.  Xopenex as needed and call with recurring use. 7.  Call with persisting questions or concerns And follow-up in 6-9 months or sooner if needed.  HPI: [name] returns to the office in follow-up of mixed rhinitis and intermittent cough, having generally done well since her last visit in September 2016.  She recalls typically increasing symptoms, this time of year with fluctuant weather patterns.  She notices intermittent postnasal drip, throat clearing, sneezing, slight sinus pressure, and occasional cough.  She denies headache, fever, sore throat, and at this time has remained off the Qvar.  She denies any bronchodilator use and has found nasal sprays beneficial.  She is most pleased with the Qnasl.  Intermittently there is mild voice changes which improved in the recent days.  No other new medical issues questions or concerns.  Denies ED or urgent care visits,  prednisone or antibiotic courses. Reports sleep and activity are normal.  [name] has a current medication list which includes the following prescription(s): vitamin c, beclomethasone dipropionate, calcium-magnesium, clindamycin lotion, clobetasol cream, colesevelam hcl, docusate sodium, levalbuterol, multivitamin-iron-minerals-folic acid, niacin, olmesartan-hydrochlorothiazide, omeprazole, pitavastatin calcium, azelastine hcl and montelukast.   Drug Allergies: Allergies  Allergen Reactions  . Latex Shortness Of Breath   Objective:   Filed Vitals:   08/26/15 1635  BP: 120/85  Pulse: 85  Temp: 97.7 F (36.5 C)  Resp: 17   Physical Exam  Constitutional: She is well-developed, well-nourished, and in no distress.  HENT:  Head: Atraumatic.  Right Ear: Tympanic membrane and ear canal normal.  Left Ear: Tympanic membrane and ear canal normal.  Nose: Mucosal edema present. No rhinorrhea. No epistaxis.  Mouth/Throat: Oropharynx is clear and moist and mucous membranes are normal. No oropharyngeal exudate, posterior oropharyngeal edema or posterior oropharyngeal erythema.  Neck: Neck supple.  Cardiovascular: Normal rate, S1 normal and S2 normal.   No murmur heard. Pulmonary/Chest: Effort normal. She has no wheezes. She has no rhonchi. She has no rales.  Lymphadenopathy:    She has no cervical adenopathy.   Diagnostics: Spirometry:  FVC 2.32.--97%, FEV1 1.92.--104%.    [name] M. Ishmael Holter, MD  cc: Elyn Peers, MD

## 2015-09-02 DIAGNOSIS — G43719 Chronic migraine without aura, intractable, without status migrainosus: Secondary | ICD-10-CM | POA: Diagnosis not present

## 2015-09-23 DIAGNOSIS — M545 Low back pain: Secondary | ICD-10-CM | POA: Diagnosis not present

## 2015-11-03 DIAGNOSIS — M545 Low back pain: Secondary | ICD-10-CM | POA: Diagnosis not present

## 2015-11-03 DIAGNOSIS — G603 Idiopathic progressive neuropathy: Secondary | ICD-10-CM | POA: Diagnosis not present

## 2015-11-03 DIAGNOSIS — M25562 Pain in left knee: Secondary | ICD-10-CM | POA: Diagnosis not present

## 2015-11-03 DIAGNOSIS — G541 Lumbosacral plexus disorders: Secondary | ICD-10-CM | POA: Diagnosis not present

## 2015-11-03 DIAGNOSIS — G894 Chronic pain syndrome: Secondary | ICD-10-CM | POA: Diagnosis not present

## 2015-11-03 DIAGNOSIS — M546 Pain in thoracic spine: Secondary | ICD-10-CM | POA: Diagnosis not present

## 2015-11-03 DIAGNOSIS — Z79891 Long term (current) use of opiate analgesic: Secondary | ICD-10-CM | POA: Diagnosis not present

## 2015-11-25 DIAGNOSIS — E7800 Pure hypercholesterolemia, unspecified: Secondary | ICD-10-CM | POA: Diagnosis not present

## 2015-11-25 DIAGNOSIS — I10 Essential (primary) hypertension: Secondary | ICD-10-CM | POA: Diagnosis not present

## 2015-11-25 DIAGNOSIS — E1149 Type 2 diabetes mellitus with other diabetic neurological complication: Secondary | ICD-10-CM | POA: Diagnosis not present

## 2015-11-26 DIAGNOSIS — D72818 Other decreased white blood cell count: Secondary | ICD-10-CM | POA: Diagnosis not present

## 2015-11-26 DIAGNOSIS — Z6834 Body mass index (BMI) 34.0-34.9, adult: Secondary | ICD-10-CM | POA: Diagnosis not present

## 2015-11-26 DIAGNOSIS — I10 Essential (primary) hypertension: Secondary | ICD-10-CM | POA: Diagnosis not present

## 2015-11-26 DIAGNOSIS — Z23 Encounter for immunization: Secondary | ICD-10-CM | POA: Diagnosis not present

## 2015-11-26 DIAGNOSIS — E119 Type 2 diabetes mellitus without complications: Secondary | ICD-10-CM | POA: Diagnosis not present

## 2015-12-01 ENCOUNTER — Telehealth: Payer: Self-pay | Admitting: Hematology and Oncology

## 2015-12-01 ENCOUNTER — Encounter: Payer: Self-pay | Admitting: Hematology and Oncology

## 2015-12-01 NOTE — Telephone Encounter (Signed)
Appointment made with Dr. Alvy Bimler on 6/26 with Dr. Alvy Bimler at 11:15. Patient agreed. Demographics verified. Letter to referring.

## 2015-12-09 ENCOUNTER — Telehealth: Payer: Self-pay | Admitting: *Deleted

## 2015-12-09 NOTE — Telephone Encounter (Signed)
Patient called requesting Qnasl 80 sample advised sample would be left up front

## 2015-12-14 ENCOUNTER — Ambulatory Visit (HOSPITAL_BASED_OUTPATIENT_CLINIC_OR_DEPARTMENT_OTHER): Payer: Medicare Other | Admitting: Hematology and Oncology

## 2015-12-14 ENCOUNTER — Encounter: Payer: Self-pay | Admitting: Hematology and Oncology

## 2015-12-14 VITALS — BP 128/73 | HR 69 | Temp 98.1°F | Resp 18 | Ht 62.5 in | Wt 205.5 lb

## 2015-12-14 DIAGNOSIS — D72819 Decreased white blood cell count, unspecified: Secondary | ICD-10-CM | POA: Diagnosis not present

## 2015-12-15 DIAGNOSIS — D72819 Decreased white blood cell count, unspecified: Secondary | ICD-10-CM | POA: Insufficient documentation

## 2015-12-15 NOTE — Progress Notes (Signed)
[facility] NOTE  Patient Care Team: Lucianne Lei, MD as PCP - General (Family Medicine)  CHIEF COMPLAINTS/PURPOSE OF CONSULTATION:  Chronic leukopenia  HISTORY OF PRESENTING ILLNESS:  [name] 75 y.o. female is here because of chronic leukopenia.  She was found to have abnormal CBC from routine blood work monitoring. I had the opportunity to review her blood tests results dated back to 10 years ago. Her blood work from May 2007 came back low at 2.5 with ANC of 0.8. She had seen a hematologist in the past and had bone marrow aspirate and biopsy in June 2007 which came back unremarkable. She denies recent infection. She has occasional sinus drainage. The last prescription antibiotics was more than 3 months ago There is not reported symptoms of cough, urinary frequency/urgency or dysuria, diarrhea, joint swelling/pain or abnormal skin rash.  She had no prior history or diagnosis of cancer. Her age appropriate screening programs are up-to-date. The patient has no prior diagnosis of autoimmune disease. She denies history of atypical infection such as shingles. No abnormal skin rashes or joint pain.  MEDICAL HISTORY:  Past Medical History  Diagnosis Date  . Hypertension     SURGICAL HISTORY: Past Surgical History  Procedure Laterality Date  . Mass excision  01/05/2012    Procedure: MINOR EXCISION OF MASS;  Surgeon: Cammie Sickle., MD;  Location: [facility];  Service: Orthopedics;  Laterality: Right;  excision mucoid cyst right long finger, debride DIP joint  . Back surgery    . Rotator cuff repair Bilateral   . Abdominal hysterectomy    . Total knee arthroplasty Left     twice    SOCIAL HISTORY: Social History   Social History  . Marital Status: Divorced    Spouse Name: N/A  . Number of Children: N/A  . Years of Education: N/A   Occupational History  . Not on file.   Social History Main Topics  . Smoking status: Never  Smoker   . Smokeless tobacco: Never Used  . Alcohol Use: No  . Drug Use: No  . Sexual Activity: Not on file     Comment: retired from [employer]. 1 son.   Other Topics Concern  . Not on file   Social History Narrative    FAMILY HISTORY: Family History  Problem Relation Age of Onset  . Allergic rhinitis Neg Hx   . Angioedema Neg Hx   . Asthma Neg Hx   . Atopy Neg Hx   . Eczema Neg Hx   . Immunodeficiency Neg Hx   . Urticaria Neg Hx     ALLERGIES:  is allergic to latex.  MEDICATIONS:  Current Outpatient Prescriptions  Medication Sig Dispense Refill  . Ascorbic Acid (VITAMIN C) 250 MG CHEW Chew 250 mg by mouth.    . beclomethasone (QVAR) 80 MCG/ACT inhaler Inhale 1 puff into the lungs daily.    . Beclomethasone Dipropionate (QNASL) 80 MCG/ACT AERS 1-2 sprays into each nostril daily. 1 Inhaler 5  . Calcium-Magnesium (CALCIUM MAGNESIUM 750) 300-300 MG TABS Take by mouth.    . clindamycin (CLEOCIN-T) 1 % lotion     . clobetasol cream (TEMOVATE) 0.05 %     . Colesevelam HCl (WELCHOL) 3.75 g PACK     . Diclofenac Sodium (PENNSAID) 1.5 % SOLN Place onto the skin. Reported on 08/26/2015    . docusate sodium (COLACE) 100 MG capsule Take 100 mg by mouth 2 (two) times daily.    Marland Kitchen  montelukast (SINGULAIR) 10 MG tablet Take 1 tablet (10 mg total) by mouth at bedtime. 30 tablet 5  . multivitamin-iron-minerals-folic acid (CENTRUM) chewable tablet Chew 1 tablet by mouth daily.    . niacin (NIASPAN) 500 MG CR tablet Take 500 mg by mouth at bedtime.    Marland Kitchen olmesartan-hydrochlorothiazide (BENICAR HCT) 20-12.5 MG per tablet Take 1 tablet by mouth daily.    Marland Kitchen omeprazole (PRILOSEC) 40 MG capsule     . Pitavastatin Calcium (LIVALO) 4 MG TABS Take by mouth.     No current facility-administered medications for this visit.    REVIEW OF SYSTEMS:   Constitutional: Denies fevers, chills or abnormal night sweats Eyes: Denies blurriness of vision, double vision or watery eyes Ears, nose,  mouth, throat, and face: Denies mucositis or sore throat Respiratory: Denies cough, dyspnea or wheezes Cardiovascular: Denies palpitation, chest discomfort or lower extremity swelling Gastrointestinal:  Denies nausea, heartburn or change in bowel habits Skin: Denies abnormal skin rashes Lymphatics: Denies new lymphadenopathy or easy bruising Neurological:Denies numbness, tingling or new weaknesses Behavioral/Psych: Mood is stable, no new changes  All other systems were reviewed with the patient and are negative.  PHYSICAL EXAMINATION: ECOG PERFORMANCE STATUS: 0 - Asymptomatic  Filed Vitals:   12/14/15 1113  BP: 128/73  Pulse: 69  Temp: 98.1 F (36.7 C)  Resp: 18   Filed Weights   12/14/15 1113  Weight: 205 lb 8 oz (93.214 kg)    GENERAL:alert, no distress and comfortable SKIN: skin color, texture, turgor are normal, no rashes or significant lesions EYES: normal, conjunctiva are pink and non-injected, sclera clear OROPHARYNX:no exudate, no erythema and lips, buccal mucosa, and tongue normal  NECK: supple, thyroid normal size, non-tender, without nodularity LYMPH:  no palpable lymphadenopathy in the cervical, axillary or inguinal LUNGS: clear to auscultation and percussion with normal breathing effort HEART: regular rate & rhythm and no murmurs and no lower extremity edema ABDOMEN:abdomen soft, non-tender and normal bowel sounds Musculoskeletal:no cyanosis of digits and no clubbing  PSYCH: alert & oriented x 3 with fluent speech NEURO: no focal motor/sensory deficits  LABORATORY DATA:  I have reviewed the data as listed Lab Results  Component Value Date   WBC 2.5* 11/09/2005   HGB 14.1 11/09/2005   HCT 41.8 11/09/2005   MCV 90.9 11/09/2005   PLT 174 11/09/2005    ASSESSMENT & PLAN Chronic leukopenia 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 It is not uncommon to see chronic leukopenia in patients of African-American heritage. Her recent worsening  leukopenia is likely exacerbated by recent viral infection. She was asymptomatic. She had extensive workup in the past including a bone marrow aspirate and biopsy which was negative. The patient does not need long-term follow-up or further work-up. I will see her back in the future only if she has recurrent infection that does not resolve with conventional oral antibiotic therapy.   Keven Soucy, MD   All questions were answered. The patient knows to call the clinic with any problems, questions or concerns. I spent 30 minutes counseling the patient face to face. The total time spent in the appointment was 35 minutes and more than 50% was on counseling.

## 2015-12-15 NOTE — Assessment & Plan Note (Signed)
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 It is not uncommon to see chronic leukopenia in patients of African-American heritage. Her recent worsening leukopenia is likely exacerbated by recent viral infection. She was asymptomatic. She had extensive workup in the past including a bone marrow aspirate and biopsy which was negative. The patient does not need long-term follow-up or further work-up. I will see her back in the future only if she has recurrent infection that does not resolve with conventional oral antibiotic therapy.

## 2016-02-09 DIAGNOSIS — M25512 Pain in left shoulder: Secondary | ICD-10-CM | POA: Diagnosis not present

## 2016-02-09 DIAGNOSIS — G894 Chronic pain syndrome: Secondary | ICD-10-CM | POA: Diagnosis not present

## 2016-02-09 DIAGNOSIS — Z79891 Long term (current) use of opiate analgesic: Secondary | ICD-10-CM | POA: Diagnosis not present

## 2016-02-09 DIAGNOSIS — M25511 Pain in right shoulder: Secondary | ICD-10-CM | POA: Diagnosis not present

## 2016-02-09 DIAGNOSIS — M546 Pain in thoracic spine: Secondary | ICD-10-CM | POA: Diagnosis not present

## 2016-02-09 DIAGNOSIS — M545 Low back pain: Secondary | ICD-10-CM | POA: Diagnosis not present

## 2016-02-09 DIAGNOSIS — M25562 Pain in left knee: Secondary | ICD-10-CM | POA: Diagnosis not present

## 2016-02-25 ENCOUNTER — Ambulatory Visit: Payer: PRIVATE HEALTH INSURANCE | Admitting: Allergy and Immunology

## 2016-02-25 ENCOUNTER — Ambulatory Visit (INDEPENDENT_AMBULATORY_CARE_PROVIDER_SITE_OTHER): Payer: Medicare Other | Admitting: Allergy

## 2016-02-25 ENCOUNTER — Encounter (INDEPENDENT_AMBULATORY_CARE_PROVIDER_SITE_OTHER): Payer: Self-pay

## 2016-02-25 ENCOUNTER — Encounter: Payer: Self-pay | Admitting: Allergy

## 2016-02-25 VITALS — BP 124/86 | HR 80 | Resp 16

## 2016-02-25 DIAGNOSIS — R490 Dysphonia: Secondary | ICD-10-CM | POA: Diagnosis not present

## 2016-02-25 DIAGNOSIS — R059 Cough, unspecified: Secondary | ICD-10-CM

## 2016-02-25 DIAGNOSIS — J310 Chronic rhinitis: Secondary | ICD-10-CM | POA: Insufficient documentation

## 2016-02-25 DIAGNOSIS — R05 Cough: Secondary | ICD-10-CM | POA: Diagnosis not present

## 2016-02-25 DIAGNOSIS — Z23 Encounter for immunization: Secondary | ICD-10-CM | POA: Diagnosis not present

## 2016-02-25 NOTE — Progress Notes (Signed)
Follow-up Note  RE: KEVONA BUSH MRN: [id number] DOB: 09/28/1940 Date of Office Visit: 02/25/2016   History of present illness: [name] is a 75 y.o. female presenting today for follow-up of cough and rhinitis. She was last seen in our office by Dr. Ishmael Holter in March 2017. Since that time she reports that her symptoms have pretty much remained the same. She has significant postnasal drip and cough. She also endorses some hoarseness. She takes Patanase 1 spray each nostril and Qnasl 1 spray each nostril daily. She does do nasal saline rinses prior to nasal spray. She takes Singulair at night.  Has seen GI who told her she had silent acid reflux; she takes omeprazole and she eventually came off of it.    she has Xopenex but does not feel that it has been helpful thus she does not use it. She denies any wheezing or chest tightness or chest heaviness. She denies any urinary care visits, prednisone or antibiotic courses. Otherwise there are no new medical issues, questions or concerns.      Review of systems: Review of Systems  Constitutional: Negative for fever.  HENT: Positive for congestion. Negative for sore throat.   Eyes: Negative for redness.  Respiratory: Positive for cough. Negative for sputum production, shortness of breath and wheezing.   Cardiovascular: Negative for chest pain.  Gastrointestinal: Negative for nausea and vomiting.  Skin: Negative for rash.  Neurological: Negative for headaches.    All other systems negative unless noted above in HPI  Past medical/social/surgical/family history have been reviewed and are unchanged unless specifically indicated below.  No changes  Medication List:   Medication List       Accurate as of 02/25/16 12:09 PM. Always use your most recent med list.          Beclomethasone Dipropionate 80 MCG/ACT Aers Commonly known as:  QNASL 1-2 sprays into each nostril daily.   CALCIUM MAGNESIUM 750 300-300 MG Tabs Generic  drug:  Calcium-Magnesium Take by mouth.   CLEOCIN-T 1 % lotion Generic drug:  clindamycin   clobetasol cream 0.05 % Commonly known as:  TEMOVATE   docusate sodium 100 MG capsule Commonly known as:  COLACE Take 100 mg by mouth 2 (two) times daily.   LIVALO 4 MG Tabs Generic drug:  Pitavastatin Calcium Take by mouth.   montelukast 10 MG tablet Commonly known as:  SINGULAIR Take 1 tablet (10 mg total) by mouth at bedtime.   multivitamin-iron-minerals-folic acid chewable tablet Chew 1 tablet by mouth daily.   niacin 500 MG CR tablet Commonly known as:  NIASPAN Take 500 mg by mouth at bedtime.   olmesartan-hydrochlorothiazide 20-12.5 MG tablet Commonly known as:  BENICAR HCT Take 1 tablet by mouth daily.   PENNSAID 1.5 % Soln Generic drug:  Diclofenac Sodium Place onto the skin. Reported on 08/26/2015   QVAR 80 MCG/ACT inhaler Generic drug:  beclomethasone Inhale 1 puff into the lungs daily.   Vitamin C 250 MG Chew Chew 250 mg by mouth.   WELCHOL 3.75 g Pack Generic drug:  Colesevelam HCl       Known medication allergies: Allergies  Allergen Reactions  . Latex Shortness Of Breath     Physical examination: Blood pressure 124/86, pulse 80, resp. rate 16.  General: Alert, interactive, in no acute distress. HEENT: TMs pearly gray, turbinates moderately edematous without discharge, post-pharynx non erythematous.drainage in the posterior pharynx present  Neck: Supple without lymphadenopathy. Lungs: Clear to auscultation without wheezing, rhonchi  or rales. {no increased work of breathing. CV: Normal S1, S2 without murmurs. Abdomen: Nondistended, nontender. Skin: Warm and dry, without lesions or rashes. Extremities:  No clubbing, cyanosis or edema. Neuro:   Grossly intact.  Diagnositics/Labs: Spirometry: FEV1: 2.06L  117%, FVC: 2.54L 112%, ratio consistent with Nonobstructive pattern normal study.  Assessment and plan:   Cough and rhinitis  - significant  post nasal drip leading to throat irritation and cough  - silent reflux likely also contributing to throat irritation and cough  - Hoarseness may be attributed to vocal cord irritation  - use patanase 2 sprays twice a day  - use Qnasl 2 sprays daily  - continue use of saline wash prior to using your nasal spray  - start Zantac 150mg  twice a day for reflux control  - continue singulair 10mg  at bedtime  - If she does not have any improvement with this regimen consider adding nasal Atrovent and    referral to speech therapy for evaluation of VCD and voice retraining  Follow-up 6 months   I appreciate the opportunity to take part in Shaliah's care. Please do not hesitate to contact me with questions.  Sincerely,   Prudy Feeler, MD Allergy/Immunology [facility]

## 2016-02-25 NOTE — Patient Instructions (Signed)
Cough and rhinitis  - significant post nasal drip leading to throat irritation and cough  - reflux can be silent and contribute to throat irritation and cough  - use patanase 2 sprays twice a day  - use Qnasl 2 sprays daily  - continue use of saline wash prior to using your nasal spray  - start Zantac 150mg  twice a day for reflux control  - continue singulair 10mg  at bedtime  Follow-up 6 months

## 2016-02-29 DIAGNOSIS — L658 Other specified nonscarring hair loss: Secondary | ICD-10-CM | POA: Diagnosis not present

## 2016-02-29 DIAGNOSIS — L669 Cicatricial alopecia, unspecified: Secondary | ICD-10-CM | POA: Diagnosis not present

## 2016-02-29 DIAGNOSIS — L680 Hirsutism: Secondary | ICD-10-CM | POA: Diagnosis not present

## 2016-02-29 DIAGNOSIS — L679 Hair color and hair shaft abnormality, unspecified: Secondary | ICD-10-CM | POA: Diagnosis not present

## 2016-03-01 DIAGNOSIS — H40019 Open angle with borderline findings, low risk, unspecified eye: Secondary | ICD-10-CM | POA: Diagnosis not present

## 2016-03-01 DIAGNOSIS — H35033 Hypertensive retinopathy, bilateral: Secondary | ICD-10-CM | POA: Diagnosis not present

## 2016-03-07 ENCOUNTER — Other Ambulatory Visit: Payer: Self-pay | Admitting: *Deleted

## 2016-03-07 MED ORDER — MONTELUKAST SODIUM 10 MG PO TABS: 10.0000 mg | ORAL_TABLET | Freq: Every day | ORAL | 5 refills | Status: DC

## 2016-03-30 DIAGNOSIS — E119 Type 2 diabetes mellitus without complications: Secondary | ICD-10-CM | POA: Diagnosis not present

## 2016-03-30 DIAGNOSIS — D72818 Other decreased white blood cell count: Secondary | ICD-10-CM | POA: Diagnosis not present

## 2016-03-30 DIAGNOSIS — I10 Essential (primary) hypertension: Secondary | ICD-10-CM | POA: Diagnosis not present

## 2016-03-31 DIAGNOSIS — I10 Essential (primary) hypertension: Secondary | ICD-10-CM | POA: Diagnosis not present

## 2016-03-31 DIAGNOSIS — E782 Mixed hyperlipidemia: Secondary | ICD-10-CM | POA: Diagnosis not present

## 2016-03-31 DIAGNOSIS — E119 Type 2 diabetes mellitus without complications: Secondary | ICD-10-CM | POA: Diagnosis not present

## 2016-05-03 ENCOUNTER — Telehealth: Payer: Self-pay | Admitting: Allergy

## 2016-05-03 NOTE — Telephone Encounter (Signed)
Called patient and advised if expensive for patient can try otc Rhinocort and go online for coupon

## 2016-05-03 NOTE — Telephone Encounter (Signed)
Pt called to she if she could get samples of Qnasl. [phone]

## 2016-05-10 DIAGNOSIS — M25512 Pain in left shoulder: Secondary | ICD-10-CM | POA: Diagnosis not present

## 2016-05-10 DIAGNOSIS — G894 Chronic pain syndrome: Secondary | ICD-10-CM | POA: Diagnosis not present

## 2016-05-10 DIAGNOSIS — Z79891 Long term (current) use of opiate analgesic: Secondary | ICD-10-CM | POA: Diagnosis not present

## 2016-05-10 DIAGNOSIS — G890 Central pain syndrome: Secondary | ICD-10-CM | POA: Diagnosis not present

## 2016-05-10 DIAGNOSIS — M546 Pain in thoracic spine: Secondary | ICD-10-CM | POA: Diagnosis not present

## 2016-05-10 DIAGNOSIS — M545 Low back pain: Secondary | ICD-10-CM | POA: Diagnosis not present

## 2016-05-10 DIAGNOSIS — M25511 Pain in right shoulder: Secondary | ICD-10-CM | POA: Diagnosis not present

## 2016-08-01 DIAGNOSIS — H6123 Impacted cerumen, bilateral: Secondary | ICD-10-CM | POA: Diagnosis not present

## 2016-08-01 DIAGNOSIS — H9193 Unspecified hearing loss, bilateral: Secondary | ICD-10-CM | POA: Diagnosis not present

## 2016-08-01 DIAGNOSIS — J302 Other seasonal allergic rhinitis: Secondary | ICD-10-CM | POA: Diagnosis not present

## 2016-08-09 DIAGNOSIS — M25511 Pain in right shoulder: Secondary | ICD-10-CM | POA: Diagnosis not present

## 2016-08-09 DIAGNOSIS — M25512 Pain in left shoulder: Secondary | ICD-10-CM | POA: Diagnosis not present

## 2016-08-09 DIAGNOSIS — Z79891 Long term (current) use of opiate analgesic: Secondary | ICD-10-CM | POA: Diagnosis not present

## 2016-08-09 DIAGNOSIS — M25562 Pain in left knee: Secondary | ICD-10-CM | POA: Diagnosis not present

## 2016-08-09 DIAGNOSIS — M25561 Pain in right knee: Secondary | ICD-10-CM | POA: Diagnosis not present

## 2016-08-09 DIAGNOSIS — G894 Chronic pain syndrome: Secondary | ICD-10-CM | POA: Diagnosis not present

## 2016-08-09 DIAGNOSIS — M545 Low back pain: Secondary | ICD-10-CM | POA: Diagnosis not present

## 2016-08-17 DIAGNOSIS — Z803 Family history of malignant neoplasm of breast: Secondary | ICD-10-CM | POA: Diagnosis not present

## 2016-08-17 DIAGNOSIS — Z1231 Encounter for screening mammogram for malignant neoplasm of breast: Secondary | ICD-10-CM | POA: Diagnosis not present

## 2016-08-25 ENCOUNTER — Encounter (INDEPENDENT_AMBULATORY_CARE_PROVIDER_SITE_OTHER): Payer: Self-pay

## 2016-08-25 ENCOUNTER — Encounter: Payer: Self-pay | Admitting: Allergy

## 2016-08-25 ENCOUNTER — Ambulatory Visit (INDEPENDENT_AMBULATORY_CARE_PROVIDER_SITE_OTHER): Payer: Medicare Other | Admitting: Allergy

## 2016-08-25 VITALS — BP 126/80 | HR 88 | Temp 98.5°F | Resp 17 | Ht 63.75 in | Wt 208.2 lb

## 2016-08-25 DIAGNOSIS — R05 Cough: Secondary | ICD-10-CM

## 2016-08-25 DIAGNOSIS — J310 Chronic rhinitis: Secondary | ICD-10-CM | POA: Diagnosis not present

## 2016-08-25 DIAGNOSIS — R059 Cough, unspecified: Secondary | ICD-10-CM

## 2016-08-25 MED ORDER — IPRATROPIUM BROMIDE 0.03 % NA SOLN: 2.0000 | Freq: Three times a day (TID) | NASAL | 5 refills | Status: DC

## 2016-08-25 MED ORDER — AZELASTINE HCL 0.1 % NA SOLN: 2.0000 | Freq: Two times a day (BID) | NASAL | 5 refills | Status: DC

## 2016-08-25 NOTE — Progress Notes (Signed)
Follow-up Note  RE: [name] MRN: [id number] DOB: 1941-01-28 Date of Office Visit: 08/25/2016   History of present illness: [name] is a 76 y.o. female presenting today for follow-up of chronic rhinitis and cough.  She was last seen in the office on 02/25/2016 by myself.  She reports she still continues to have significant postnasal drip leading to throat irritation and cough. She was using Qnasl which she reports was helpful however her insurance no longer covers this that she has not been using this medication. She does continue to use Patanase but she does not feel that it has been helpful. She also takes Zantac as well as Singulair. She still has some hoarseness to her voice that is worse in the morning.   Otherwise since her last visit she has not had any changes with her health, surgeries or hospitalizations. She has had no antibiotic or antiviral needs.   Review of systems: Review of Systems  Constitutional: Negative for chills, fever and malaise/fatigue.  HENT: Positive for congestion and sore throat. Negative for ear discharge, ear pain, nosebleeds, sinus pain and tinnitus.   Eyes: Negative for discharge and redness.  Respiratory: Positive for cough. Negative for sputum production, shortness of breath and wheezing.   Cardiovascular: Negative for chest pain.  Gastrointestinal: Negative for abdominal pain, heartburn, nausea and vomiting.  Musculoskeletal: Negative for joint pain and myalgias.  Skin: Negative for itching and rash.  Neurological: Negative for headaches.    All other systems negative unless noted above in HPI  Past medical/social/surgical/family history have been reviewed and are unchanged unless specifically indicated below.  No changes  Medication List: Allergies as of 08/25/2016      Reactions   Latex Shortness Of Breath      Medication List       Accurate as of 08/25/16  3:08 PM. Always use your most recent med list.            beclomethasone 80 MCG/ACT inhaler Commonly known as:  QVAR Inhale into the lungs.   Beclomethasone Dipropionate 80 MCG/ACT Aers Commonly known as:  QNASL 1-2 sprays into each nostril daily.   betamethasone dipropionate 0.05 % ointment Commonly known as:  DIPROLENE   CALCIUM MAGNESIUM 750 300-300 MG Tabs Generic drug:  Calcium-Magnesium Take by mouth.   CLEOCIN-T 1 % lotion Generic drug:  clindamycin   clobetasol cream 0.05 % Commonly known as:  TEMOVATE   LIVALO 4 MG Tabs Generic drug:  Pitavastatin Calcium Take by mouth.   MAGNESIUM PO Take by mouth.   montelukast 10 MG tablet Commonly known as:  SINGULAIR Take 1 tablet (10 mg total) by mouth at bedtime.   multivitamin-iron-minerals-folic acid chewable tablet Chew 1 tablet by mouth daily.   niacin 500 MG CR tablet Commonly known as:  NIASPAN Take 500 mg by mouth at bedtime.   olmesartan-hydrochlorothiazide 20-12.5 MG tablet Commonly known as:  BENICAR HCT Take 1 tablet by mouth daily.   Olopatadine HCl 0.6 % Soln Place into the nose.   diclofenac sodium 1 % Gel Commonly known as:  VOLTAREN   PENNSAID 1.5 % Soln Generic drug:  Diclofenac Sodium Place onto the skin. Reported on 08/26/2015   Vitamin C 250 MG Chew Chew 250 mg by mouth.   WELCHOL 3.75 g Pack Generic drug:  Colesevelam HCl       Known medication allergies: Allergies  Allergen Reactions  . Latex Shortness Of Breath     Physical examination: Blood pressure  126/80, pulse 88, temperature 98.5 F (36.9 C), temperature source Oral, resp. rate 17, height 5' 3.75" (1.619 m), weight 208 lb 3.2 oz (94.4 kg), SpO2 97 %.  General: Alert, interactive, in no acute distress. HEENT: TMs pearly gray, turbinates mildly edematous with clear discharge, post-pharynx non erythematous positive cobblestoning Neck: Supple without lymphadenopathy. Lungs: Clear to auscultation without wheezing, rhonchi or rales. {no increased work of breathing. CV: Normal  S1, S2 without murmurs. Abdomen: Nondistended, nontender. Skin: Warm and dry, without lesions or rashes. Extremities:  No clubbing, cyanosis or edema. Neuro:   Grossly intact.  Diagnositics/Labs:  Spirometry: FEV1: 1.98L  108%, FVC: 2.44L  103%, ratio consistent with nonobstructive pattern  Assessment and plan: Chronic rhinitis with cough  - significant post nasal drip leading to throat irritation and cough  - provided with Dymista sample to use 1 spray twice a day --- let us know if you have improvement on this medication.  Once done with this then use the sprays as below  - use Atrovent 2 sprays each nostrils as needed up to 3-4 times a day for nasal drip/drainage  - will switch Patanase to Astelin 2 sprays twice a day  - use Qnasl 2 sprays twice a day (provided with samples)  - we can also try Omnaris nasal spray if above does not provide enough benefit as does appear to be covered by her insurance  - continue use of saline wash prior to using your nasal spray  - continueZantac 150mg  twice a day for reflux control  - continue singulair 10mg  at bedtime  Follow-up 6-9 months or sooner if needed  I appreciate the opportunity to take part in Myron's care. Please do not hesitate to contact me with questions.  Sincerely,   Prudy Feeler, MD Allergy/Immunology [facility] [place]

## 2016-08-25 NOTE — Patient Instructions (Addendum)
Cough and rhinitis    - significant post nasal drip leading to throat irritation and cough  - reflux can be silent and contribute to throat irritation and cough  - provided with Dymista sample to use 1 spray twice a day --- let us know if you have improvement on this medication.  Once done with this then use the sprays as below  - use Atrovent 2 sprays each nostrils as needed up to 3-4 times a day for nasal drip/drainage  - will switch Patanase to Astelin 2 sprays twice a day  - use Qnasl 2 sprays twice a day (provided with samples)  - we can also try Omnaris nasal spray if above does not provide enough benefit  - continue use of saline wash prior to using your nasal spray  - continueZantac 150mg  twice a day for reflux control  - continue singulair 10mg  at bedtime  Follow-up 6-9 months or sooner if needed

## 2016-08-26 ENCOUNTER — Other Ambulatory Visit: Payer: Self-pay

## 2016-08-26 DIAGNOSIS — J310 Chronic rhinitis: Secondary | ICD-10-CM

## 2016-08-26 MED ORDER — IPRATROPIUM BROMIDE 0.03 % NA SOLN: 2.0000 | Freq: Three times a day (TID) | NASAL | 1 refills | Status: DC

## 2016-08-26 NOTE — Telephone Encounter (Signed)
Pt request 90 day supply

## 2016-08-29 DIAGNOSIS — H2512 Age-related nuclear cataract, left eye: Secondary | ICD-10-CM | POA: Diagnosis not present

## 2016-08-29 DIAGNOSIS — H35033 Hypertensive retinopathy, bilateral: Secondary | ICD-10-CM | POA: Diagnosis not present

## 2016-09-07 DIAGNOSIS — H35033 Hypertensive retinopathy, bilateral: Secondary | ICD-10-CM | POA: Diagnosis not present

## 2016-09-22 DIAGNOSIS — J111 Influenza due to unidentified influenza virus with other respiratory manifestations: Secondary | ICD-10-CM | POA: Diagnosis not present

## 2016-10-04 DIAGNOSIS — E119 Type 2 diabetes mellitus without complications: Secondary | ICD-10-CM | POA: Diagnosis not present

## 2016-10-04 DIAGNOSIS — J1000 Influenza due to other identified influenza virus with unspecified type of pneumonia: Secondary | ICD-10-CM | POA: Diagnosis not present

## 2016-10-25 ENCOUNTER — Other Ambulatory Visit: Payer: Self-pay | Admitting: Allergy & Immunology

## 2016-10-28 DIAGNOSIS — H18413 Arcus senilis, bilateral: Secondary | ICD-10-CM | POA: Diagnosis not present

## 2016-10-28 DIAGNOSIS — H02839 Dermatochalasis of unspecified eye, unspecified eyelid: Secondary | ICD-10-CM | POA: Diagnosis not present

## 2016-10-28 DIAGNOSIS — H2512 Age-related nuclear cataract, left eye: Secondary | ICD-10-CM | POA: Diagnosis not present

## 2016-10-28 DIAGNOSIS — Z961 Presence of intraocular lens: Secondary | ICD-10-CM | POA: Diagnosis not present

## 2016-11-08 DIAGNOSIS — E785 Hyperlipidemia, unspecified: Secondary | ICD-10-CM | POA: Diagnosis not present

## 2016-11-08 DIAGNOSIS — E119 Type 2 diabetes mellitus without complications: Secondary | ICD-10-CM | POA: Diagnosis not present

## 2016-11-08 DIAGNOSIS — I10 Essential (primary) hypertension: Secondary | ICD-10-CM | POA: Diagnosis not present

## 2016-11-10 DIAGNOSIS — E119 Type 2 diabetes mellitus without complications: Secondary | ICD-10-CM | POA: Diagnosis not present

## 2016-11-10 DIAGNOSIS — E782 Mixed hyperlipidemia: Secondary | ICD-10-CM | POA: Diagnosis not present

## 2016-11-10 DIAGNOSIS — I10 Essential (primary) hypertension: Secondary | ICD-10-CM | POA: Diagnosis not present

## 2016-11-28 DIAGNOSIS — L669 Cicatricial alopecia, unspecified: Secondary | ICD-10-CM | POA: Diagnosis not present

## 2016-11-28 DIAGNOSIS — L680 Hirsutism: Secondary | ICD-10-CM | POA: Diagnosis not present

## 2016-12-26 DIAGNOSIS — H2512 Age-related nuclear cataract, left eye: Secondary | ICD-10-CM | POA: Diagnosis not present

## 2017-03-01 ENCOUNTER — Ambulatory Visit (INDEPENDENT_AMBULATORY_CARE_PROVIDER_SITE_OTHER): Payer: Medicare Other | Admitting: Allergy

## 2017-03-01 ENCOUNTER — Encounter: Payer: Self-pay | Admitting: Allergy

## 2017-03-01 VITALS — BP 128/72 | HR 92 | Resp 17

## 2017-03-01 DIAGNOSIS — R05 Cough: Secondary | ICD-10-CM

## 2017-03-01 DIAGNOSIS — J310 Chronic rhinitis: Secondary | ICD-10-CM

## 2017-03-01 DIAGNOSIS — R059 Cough, unspecified: Secondary | ICD-10-CM

## 2017-03-01 MED ORDER — BECLOMETHASONE DIPROPIONATE 80 MCG/ACT NA AERS: 2.0000 | INHALATION_SPRAY | NASAL | 5 refills | Status: DC

## 2017-03-01 MED ORDER — LEVALBUTEROL TARTRATE 45 MCG/ACT IN AERO: 2.0000 | INHALATION_SPRAY | RESPIRATORY_TRACT | 1 refills | Status: DC | PRN

## 2017-03-01 MED ORDER — CARBINOXAMINE MALEATE 6 MG PO TABS: 6.0000 mg | ORAL_TABLET | Freq: Two times a day (BID) | ORAL | 5 refills | Status: DC

## 2017-03-01 NOTE — Patient Instructions (Addendum)
Cough and rhinitis   - significant post nasal drip leading to throat irritation and cough  - reflux can be silent and contribute to throat irritation and cough  - use Qnasl - 2 sprays twice a day (provided with samples today) -- will order and place a prior auth to see if we can get Qnasl covered.    - continue use of saline wash prior to using your nasal spray  - continue Zantac 150mg  twice a day for reflux control  - continue singulair 10mg  at bedtime  - use Xopenex 2 sprays each nostril every 4-6 hours as needed for cough/wheeze/shortness of breath/chest tightness.  This is a quick relief medication.   Hold Qvar as this is more of a controller medication.     - will provide with RyVent 6mg  and may take up to twice a day to help with nasal drainage.  Take first dose in evening to see if will make you drowsy if not then can take during the day.  This is an antihistamine medication  Follow-up 6-9 months or sooner if needed

## 2017-03-01 NOTE — Progress Notes (Signed)
Follow-up Note  RE: EMILIANNA BARLOWE MRN: [id number] DOB: 02/25/1941 Date of Office Visit: 03/01/2017   History of present illness: [name] is a 76 y.o. female presenting today for follow-up of cough and chronic rhinitis.   She was last seen in the office on 08/25/16 by myself.  Since this visit she denies any major health changes or hospitalizations.  She did have lt cataract removal on 12/26/16.   With her nasal drainage she reports Qnasl has been the most effective nasal spray for her.  She has tried Dymista, Atrovent, Patanase without any improvement in her symptoms.  She rant out of her Qnasl that was provided as samples about a month ago.   She does continue to take Singulair daily.   She was prescribed Qvar by Dr. Ishmael Holter and reports she uses it for relief of coughing spells.  She states she does not have albuterol or xopenex for rescue of symptoms.  She reports she will use the Qvar rather infrequently.  She denies any nighttime awakenings or oral steroid use.     Review of systems: Review of Systems  Constitutional: Negative for chills, fever and malaise/fatigue.  HENT: Positive for congestion. Negative for ear discharge, ear pain, nosebleeds, sinus pain, sore throat and tinnitus.   Eyes: Negative for pain, discharge and redness.  Respiratory: Negative for cough, shortness of breath and wheezing.   Cardiovascular: Negative for chest pain.  Gastrointestinal: Negative for abdominal pain, constipation, diarrhea, heartburn, nausea and vomiting.  Musculoskeletal: Negative for joint pain.  Skin: Negative for itching and rash.  Neurological: Negative for headaches.    All other systems negative unless noted above in HPI  Past medical/social/surgical/family history have been reviewed and are unchanged unless specifically indicated below.  No changes  Medication List: Allergies as of 03/01/2017      Reactions   Latex Shortness Of Breath      Medication List         Accurate as of 03/01/17  5:07 PM. Always use your most recent med list.          azelastine 0.1 % nasal spray Commonly known as:  ASTELIN Place 2 sprays into both nostrils 2 (two) times daily. Use in each nostril as directed   beclomethasone 80 MCG/ACT inhaler Commonly known as:  QVAR Inhale into the lungs.   betamethasone dipropionate 0.05 % ointment Commonly known as:  DIPROLENE   CALCIUM MAGNESIUM 750 300-300 MG Tabs Generic drug:  Calcium-Magnesium Take by mouth.   CLEOCIN-T 1 % lotion Generic drug:  clindamycin   clobetasol cream 0.05 % Commonly known as:  TEMOVATE   docusate sodium 100 MG capsule Commonly known as:  COLACE Take 100 mg by mouth daily as needed for mild constipation.   Doxycycline Hyclate 50 MG Tabs Take 100 mg by mouth daily.   ipratropium 0.03 % nasal spray Commonly known as:  ATROVENT Place 2 sprays into both nostrils 3 (three) times daily. Use as needed for nasal drainage/drip   LIVALO 4 MG Tabs Generic drug:  Pitavastatin Calcium Take by mouth.   MAGNESIUM PO Take by mouth.   montelukast 10 MG tablet Commonly known as:  SINGULAIR TAKE 1 TABLET BY MOUTH AT BEDTIME.   multivitamin-iron-minerals-folic acid chewable tablet Chew 1 tablet by mouth daily.   niacin 500 MG CR tablet Commonly known as:  NIASPAN Take 500 mg by mouth at bedtime.   olmesartan-hydrochlorothiazide 20-12.5 MG tablet Commonly known as:  BENICAR HCT Take 1 tablet  by mouth daily.   Olopatadine HCl 0.6 % Soln Place into the nose.   diclofenac sodium 1 % Gel Commonly known as:  VOLTAREN   PENNSAID 1.5 % Soln Generic drug:  Diclofenac Sodium Place onto the skin. Reported on 08/26/2015   Vitamin C 250 MG Chew Chew 250 mg by mouth.   WELCHOL 3.75 g Pack Generic drug:  Colesevelam HCl   XOPENEX HFA 45 MCG/ACT inhaler Generic drug:  levalbuterol Inhale 1-2 puffs into the lungs every 4 (four) hours as needed for wheezing.       Known medication  allergies: Allergies  Allergen Reactions  . Latex Shortness Of Breath     Physical examination: Blood pressure 128/72, pulse 92, resp. rate 17, SpO2 96 %.  General: Alert, interactive, in no acute distress. HEENT: PERRLA, TMs pearly gray, turbinates moderately edematous with clear discharge, post-pharynx non erythematous. Neck: Supple without lymphadenopathy. Lungs: Clear to auscultation without wheezing, rhonchi or rales. {no increased work of breathing. CV: Normal S1, S2 without murmurs. Abdomen: Nondistended, nontender. Skin: Warm and dry, without lesions or rashes. Extremities:  No clubbing, cyanosis or edema. Neuro:   Grossly intact.  Diagnositics/Labs:  Spirometry: FEV1: 2.12L  116%, FVC: 3.05L  129%, ratio consistent with nonbostructive pattern  Assessment and plan:   Cough and rhinitis   - significant post nasal drip leading to throat irritation and cough  - reflux can be silent and contribute to throat irritation and cough  - use Qnasl - 2 sprays twice a day (provided with samples today) -- will order and place a prior auth to see if we can get Qnasl covered.    - continue use of saline wash prior to using your nasal spray  - continue Zantac 150mg  twice a day for reflux control  - continue singulair 10mg  at bedtime  - use Xopenex 2 sprays each nostril every 4-6 hours as needed for cough/wheeze/shortness of breath/chest tightness.  This is a quick relief medication.   Hold Qvar as this is more of a controller medication.     - will provide with RyVent 6mg  and may take up to twice a day to help with nasal drainage.  Take first dose in evening to see if will make you drowsy if not then can take during the day.  This is an antihistamine medication  Follow-up 6-9 months or sooner if needed  I appreciate the opportunity to take part in Danaja's care. Please do not hesitate to contact me with questions.  Sincerely,   Prudy Feeler, MD Allergy/Immunology [facility] [place]

## 2017-03-02 ENCOUNTER — Telehealth: Payer: Self-pay | Admitting: Allergy

## 2017-03-02 DIAGNOSIS — M25562 Pain in left knee: Secondary | ICD-10-CM | POA: Diagnosis not present

## 2017-03-02 DIAGNOSIS — R05 Cough: Secondary | ICD-10-CM

## 2017-03-02 DIAGNOSIS — Z79891 Long term (current) use of opiate analgesic: Secondary | ICD-10-CM | POA: Diagnosis not present

## 2017-03-02 DIAGNOSIS — R059 Cough, unspecified: Secondary | ICD-10-CM

## 2017-03-02 DIAGNOSIS — M545 Low back pain: Secondary | ICD-10-CM | POA: Diagnosis not present

## 2017-03-02 DIAGNOSIS — M25512 Pain in left shoulder: Secondary | ICD-10-CM | POA: Diagnosis not present

## 2017-03-02 DIAGNOSIS — G894 Chronic pain syndrome: Secondary | ICD-10-CM | POA: Diagnosis not present

## 2017-03-02 DIAGNOSIS — M25511 Pain in right shoulder: Secondary | ICD-10-CM | POA: Diagnosis not present

## 2017-03-02 MED ORDER — LEVALBUTEROL TARTRATE 45 MCG/ACT IN AERO: 2.0000 | INHALATION_SPRAY | RESPIRATORY_TRACT | 1 refills | Status: DC | PRN

## 2017-03-02 NOTE — Telephone Encounter (Signed)
Received a fax for a 90 day supply. I sent in the 90 day script to the pharmacy.

## 2017-03-13 DIAGNOSIS — E119 Type 2 diabetes mellitus without complications: Secondary | ICD-10-CM | POA: Diagnosis not present

## 2017-03-13 DIAGNOSIS — E782 Mixed hyperlipidemia: Secondary | ICD-10-CM | POA: Diagnosis not present

## 2017-03-13 DIAGNOSIS — I10 Essential (primary) hypertension: Secondary | ICD-10-CM | POA: Diagnosis not present

## 2017-03-14 DIAGNOSIS — D72818 Other decreased white blood cell count: Secondary | ICD-10-CM | POA: Diagnosis not present

## 2017-03-14 DIAGNOSIS — E119 Type 2 diabetes mellitus without complications: Secondary | ICD-10-CM | POA: Diagnosis not present

## 2017-03-14 DIAGNOSIS — I10 Essential (primary) hypertension: Secondary | ICD-10-CM | POA: Diagnosis not present

## 2017-03-14 DIAGNOSIS — E782 Mixed hyperlipidemia: Secondary | ICD-10-CM | POA: Diagnosis not present

## 2017-03-17 DIAGNOSIS — Z23 Encounter for immunization: Secondary | ICD-10-CM | POA: Diagnosis not present

## 2017-03-18 ENCOUNTER — Other Ambulatory Visit: Payer: Self-pay | Admitting: Allergy & Immunology

## 2017-06-05 DIAGNOSIS — H2512 Age-related nuclear cataract, left eye: Secondary | ICD-10-CM | POA: Diagnosis not present

## 2017-06-05 DIAGNOSIS — H35033 Hypertensive retinopathy, bilateral: Secondary | ICD-10-CM | POA: Diagnosis not present

## 2017-06-05 DIAGNOSIS — Z961 Presence of intraocular lens: Secondary | ICD-10-CM | POA: Diagnosis not present

## 2017-06-09 DIAGNOSIS — M5412 Radiculopathy, cervical region: Secondary | ICD-10-CM | POA: Diagnosis not present

## 2017-06-09 DIAGNOSIS — M542 Cervicalgia: Secondary | ICD-10-CM | POA: Diagnosis not present

## 2017-06-09 DIAGNOSIS — G894 Chronic pain syndrome: Secondary | ICD-10-CM | POA: Diagnosis not present

## 2017-06-09 DIAGNOSIS — R202 Paresthesia of skin: Secondary | ICD-10-CM | POA: Diagnosis not present

## 2017-06-09 DIAGNOSIS — M25562 Pain in left knee: Secondary | ICD-10-CM | POA: Diagnosis not present

## 2017-06-09 DIAGNOSIS — M545 Low back pain: Secondary | ICD-10-CM | POA: Diagnosis not present

## 2017-06-09 DIAGNOSIS — M25561 Pain in right knee: Secondary | ICD-10-CM | POA: Diagnosis not present

## 2017-08-14 DIAGNOSIS — E1149 Type 2 diabetes mellitus with other diabetic neurological complication: Secondary | ICD-10-CM | POA: Diagnosis not present

## 2017-08-14 DIAGNOSIS — E782 Mixed hyperlipidemia: Secondary | ICD-10-CM | POA: Diagnosis not present

## 2017-08-14 DIAGNOSIS — I10 Essential (primary) hypertension: Secondary | ICD-10-CM | POA: Diagnosis not present

## 2017-08-14 DIAGNOSIS — E7800 Pure hypercholesterolemia, unspecified: Secondary | ICD-10-CM | POA: Diagnosis not present

## 2017-08-14 DIAGNOSIS — E785 Hyperlipidemia, unspecified: Secondary | ICD-10-CM | POA: Diagnosis not present

## 2017-08-14 DIAGNOSIS — E119 Type 2 diabetes mellitus without complications: Secondary | ICD-10-CM | POA: Diagnosis not present

## 2017-08-15 DIAGNOSIS — I10 Essential (primary) hypertension: Secondary | ICD-10-CM | POA: Diagnosis not present

## 2017-08-15 DIAGNOSIS — R251 Tremor, unspecified: Secondary | ICD-10-CM | POA: Diagnosis not present

## 2017-08-15 DIAGNOSIS — R5383 Other fatigue: Secondary | ICD-10-CM | POA: Diagnosis not present

## 2017-08-15 DIAGNOSIS — E119 Type 2 diabetes mellitus without complications: Secondary | ICD-10-CM | POA: Diagnosis not present

## 2017-08-18 DIAGNOSIS — Z1231 Encounter for screening mammogram for malignant neoplasm of breast: Secondary | ICD-10-CM | POA: Diagnosis not present

## 2017-08-30 ENCOUNTER — Ambulatory Visit (INDEPENDENT_AMBULATORY_CARE_PROVIDER_SITE_OTHER): Payer: Medicare Other | Admitting: Allergy

## 2017-08-30 ENCOUNTER — Encounter: Payer: Self-pay | Admitting: Allergy

## 2017-08-30 VITALS — BP 112/74 | HR 96 | Temp 99.1°F | Resp 16

## 2017-08-30 DIAGNOSIS — R05 Cough: Secondary | ICD-10-CM | POA: Diagnosis not present

## 2017-08-30 DIAGNOSIS — J310 Chronic rhinitis: Secondary | ICD-10-CM | POA: Diagnosis not present

## 2017-08-30 DIAGNOSIS — R059 Cough, unspecified: Secondary | ICD-10-CM

## 2017-08-30 MED ORDER — CARBINOXAMINE MALEATE 6 MG PO TABS: 6.0000 mg | ORAL_TABLET | Freq: Two times a day (BID) | ORAL | 5 refills | Status: DC

## 2017-08-30 MED ORDER — BECLOMETHASONE DIPROPIONATE 80 MCG/ACT NA AERS: 2.0000 | INHALATION_SPRAY | Freq: Every day | NASAL | 5 refills | Status: DC | PRN

## 2017-08-30 MED ORDER — BUDESONIDE-FORMOTEROL FUMARATE 80-4.5 MCG/ACT IN AERO: 2.0000 | INHALATION_SPRAY | Freq: Two times a day (BID) | RESPIRATORY_TRACT | 5 refills | Status: DC

## 2017-08-30 NOTE — Patient Instructions (Addendum)
Cough and rhinitis  - improved but still symptomatic  - post nasal drip leading to throat irritation and cough  - use Qnasl - 2 sprays daily   - continue use of saline wash prior to using your nasal spray   - continue singulair 10mg  at bedtime  - use Xopenex 2 sprays each nostril every 4-6 hours as needed for cough/wheeze/shortness of breath/chest tightness.  This is a quick relief medication.     - start Symbicort 74mcg 2 puffs twice a day.  This replaces Qvar.     - will provide with RyVent 6mg  and may take up to twice a day to help with nasal drainage.  Take first dose in evening to see if will make you drowsy if not then can take during the day.  This is an antihistamine medication  Follow-up 6-9 months or sooner if needed

## 2017-08-30 NOTE — Progress Notes (Signed)
Follow-up Note  RE: [name] MRN: [id number] DOB: August 19, 1940 Date of Office Visit: 08/30/2017   History of present illness: [name] is a 77 y.o. female presenting today for follow-up of cough and chronic rhinitis.  She was last seen in the office on March 01, 2017 by myself.  She denies any major health changes since last visit however she states she was in an accident and has had to have several surgeries on her left knee which leaves her unable to bend the knee completely.  She states with her cough and rhinitis that symptoms are improved but not completely resolved.  She was using Qnasl that she ran out of a week ago.  She definitely can tell that her rhinitis is a lot worse being off of her Qnasl.  She also continues to take Singulair at bedtime.  She states she has been needing to use her Xopenex about 3 days a week.  She also states she is continue to use Qvar 2 puffs twice a day however she also has been out of this medication to.  We had discussed her trialing RyVent after the last visit for some antihistamine control however she states she never got this medication.  She denies any nighttime awakenings.  She denies any need for oral steroids, ED or urgent care visits.   Review of systems: Review of Systems  Constitutional: Negative for chills, fever and malaise/fatigue.  HENT: Positive for congestion. Negative for ear discharge, ear pain, nosebleeds, sinus pain and sore throat.   Eyes: Negative for pain, discharge and redness.  Respiratory: Positive for cough. Negative for sputum production, shortness of breath and wheezing.   Cardiovascular: Negative for chest pain.  Gastrointestinal: Negative for abdominal pain, constipation, diarrhea, heartburn, nausea and vomiting.  Musculoskeletal: Negative for joint pain.  Skin: Negative for itching and rash.  Neurological: Negative for headaches.    All other systems negative unless noted above in HPI  Past  medical/social/surgical/family history have been reviewed and are unchanged unless specifically indicated below.  No changes  Medication List: Allergies as of 08/30/2017      Reactions   Latex Shortness Of Breath      Medication List        Accurate as of 08/30/17 11:59 PM. Always use your most recent med list.          azelastine 0.1 % nasal spray Commonly known as:  ASTELIN Place 2 sprays into both nostrils 2 (two) times daily. Use in each nostril as directed   beclomethasone 80 MCG/ACT inhaler Commonly known as:  QVAR Inhale into the lungs.   Beclomethasone Dipropionate 80 MCG/ACT Aers Commonly known as:  QNASL Place 2 sprays into both nostrils 1 day or 1 dose.   Beclomethasone Dipropionate 80 MCG/ACT Aers Commonly known as:  QNASL Place 2 sprays into both nostrils daily as needed.   betamethasone dipropionate 0.05 % ointment Commonly known as:  DIPROLENE   budesonide-formoterol 80-4.5 MCG/ACT inhaler Commonly known as:  SYMBICORT Inhale 2 puffs into the lungs 2 (two) times daily.   CALCIUM MAGNESIUM 750 300-300 MG Tabs Generic drug:  Calcium-Magnesium Take by mouth.   Carbinoxamine Maleate 6 MG Tabs Commonly known as:  RYVENT Take 6 mg by mouth 2 (two) times daily.   CLEOCIN-T 1 % lotion Generic drug:  clindamycin   clobetasol cream 0.05 % Commonly known as:  TEMOVATE   docusate sodium 100 MG capsule Commonly known as:  COLACE Take 100 mg by  mouth daily as needed for mild constipation.   Doxycycline Hyclate 50 MG Tabs Take 100 mg by mouth daily.   ipratropium 0.03 % nasal spray Commonly known as:  ATROVENT Place 2 sprays into both nostrils 3 (three) times daily. Use as needed for nasal drainage/drip   levalbuterol 45 MCG/ACT inhaler Commonly known as:  XOPENEX HFA Inhale 2 puffs into the lungs every 4 (four) hours as needed for wheezing.   LIVALO 4 MG Tabs Generic drug:  Pitavastatin Calcium Take by mouth.   montelukast 10 MG  tablet Commonly known as:  SINGULAIR TAKE 1 TABLET BY MOUTH AT BEDTIME.   multivitamin-iron-minerals-folic acid chewable tablet Chew 1 tablet by mouth daily.   niacin 500 MG CR tablet Commonly known as:  NIASPAN Take 500 mg by mouth at bedtime.   olmesartan-hydrochlorothiazide 20-12.5 MG tablet Commonly known as:  BENICAR HCT Take 1 tablet by mouth daily.   Olopatadine HCl 0.6 % Soln Place into the nose.   diclofenac sodium 1 % Gel Commonly known as:  VOLTAREN   PENNSAID 1.5 % Soln Generic drug:  Diclofenac Sodium Place onto the skin. Reported on 08/26/2015   Vitamin C 250 MG Chew Chew 250 mg by mouth.   WELCHOL 3.75 g Pack Generic drug:  Colesevelam HCl       Known medication allergies: Allergies  Allergen Reactions  . Latex Shortness Of Breath     Physical examination: Blood pressure 112/74, pulse 96, temperature 99.1 F (37.3 C), temperature source Oral, resp. rate 16, SpO2 93 %.  General: Alert, interactive, in no acute distress. HEENT: PERRLA, TMs pearly gray, turbinates mildly edematous with clear discharge, post-pharynx non erythematous. Neck: Supple without lymphadenopathy. Lungs: Clear to auscultation without wheezing, rhonchi or rales. {no increased work of breathing. CV: Normal S1, S2 without murmurs. Abdomen: Nondistended, nontender. Skin: Warm and dry, without lesions or rashes. Extremities:  No clubbing, cyanosis or edema. Neuro:   Grossly intact.  Diagnositics/Labs:  Spirometry: FEV1: 2.02L  113%, FVC: 2.65L  115%, ratio consistent with nonobstructive pattern  Assessment and plan:   Cough and rhinitis  - improved but still symptomatic  - post nasal drip leading to throat irritation and cough  - use Qnasl - 2 sprays daily  - will send in prescription.  I have advised her if it is not affordable after insurance coverage to let us know and will prescribe alternative steroids spray  - continue use of saline wash prior to using your nasal  spray   - continue singulair 10mg  at bedtime  - use Xopenex 2 sprays each nostril every 4-6 hours as needed for cough/wheeze/shortness of breath/chest tightness.  This is a quick relief medication.     - start Symbicort 15mcg 2 puffs twice a day.  This replaces Qvar at this time.     - will provide with RyVent 6mg  and may take up to twice a day to help with nasal drainage.  Take first dose in evening to see if will make you drowsy if not then can take during the day.    Follow-up 6-9 months or sooner if needed  I appreciate the opportunity to take part in [name]'s care. Please do not hesitate to contact me with questions.  Sincerely,   Prudy Feeler, MD Allergy/Immunology [facility] [place]

## 2017-09-07 DIAGNOSIS — G894 Chronic pain syndrome: Secondary | ICD-10-CM | POA: Diagnosis not present

## 2017-09-07 DIAGNOSIS — M545 Low back pain: Secondary | ICD-10-CM | POA: Diagnosis not present

## 2017-09-07 DIAGNOSIS — M25561 Pain in right knee: Secondary | ICD-10-CM | POA: Diagnosis not present

## 2017-09-07 DIAGNOSIS — M25562 Pain in left knee: Secondary | ICD-10-CM | POA: Diagnosis not present

## 2017-09-25 DIAGNOSIS — G250 Essential tremor: Secondary | ICD-10-CM | POA: Diagnosis not present

## 2017-10-18 ENCOUNTER — Telehealth: Payer: Self-pay | Admitting: *Deleted

## 2017-10-18 DIAGNOSIS — R251 Tremor, unspecified: Secondary | ICD-10-CM | POA: Diagnosis not present

## 2017-10-18 DIAGNOSIS — I10 Essential (primary) hypertension: Secondary | ICD-10-CM | POA: Diagnosis not present

## 2017-10-27 ENCOUNTER — Encounter: Payer: Self-pay | Admitting: Neurology
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
## 2017-11-09 DIAGNOSIS — H6123 Impacted cerumen, bilateral: Secondary | ICD-10-CM | POA: Diagnosis not present

## 2017-11-10 ENCOUNTER — Encounter: Payer: Self-pay | Admitting: Neurology

## 2017-11-10 ENCOUNTER — Ambulatory Visit (INDEPENDENT_AMBULATORY_CARE_PROVIDER_SITE_OTHER): Payer: Medicare Other | Admitting: Neurology

## 2017-11-10 ENCOUNTER — Other Ambulatory Visit: Payer: Medicare Other

## 2017-11-10 VITALS — BP 134/74 | HR 84 | Ht 65.5 in | Wt 204.0 lb

## 2017-11-10 DIAGNOSIS — R251 Tremor, unspecified: Secondary | ICD-10-CM

## 2017-11-10 DIAGNOSIS — D72818 Other decreased white blood cell count: Secondary | ICD-10-CM

## 2017-11-10 LAB — TSH: TSH: 1.79 mIU/L (ref 0.40–4.50)

## 2017-11-10 NOTE — Patient Instructions (Signed)
1. Your provider has requested that you have labwork completed today. Please go to [facility] ([id number]) on the second floor of this building before leaving the office today. You do not need to check in. If you are not called within 15 minutes please check with the front desk.   2. Call us when you need a refill of Primidone.

## 2017-11-14 ENCOUNTER — Telehealth: Payer: Self-pay | Admitting: Neurology

## 2017-11-14 NOTE — Telephone Encounter (Signed)
Patient called back and she was made aware.

## 2017-11-14 NOTE — Telephone Encounter (Signed)
-----   Message from [place], DO sent at 11/14/2017  7:25 AM EDT ----- I have reviewed all lab results which are normal or stable. Please inform the patient.

## 2017-11-14 NOTE — Telephone Encounter (Signed)
Left message on machine for patient to call back.

## 2017-11-21 ENCOUNTER — Ambulatory Visit (INDEPENDENT_AMBULATORY_CARE_PROVIDER_SITE_OTHER): Payer: Medicare Other

## 2017-11-21 ENCOUNTER — Encounter (INDEPENDENT_AMBULATORY_CARE_PROVIDER_SITE_OTHER): Payer: Self-pay | Admitting: Orthopedic Surgery

## 2017-11-21 ENCOUNTER — Ambulatory Visit (INDEPENDENT_AMBULATORY_CARE_PROVIDER_SITE_OTHER): Payer: Medicare Other | Admitting: Orthopedic Surgery

## 2017-11-21 VITALS — Ht 65.0 in | Wt 204.0 lb

## 2017-11-21 DIAGNOSIS — M1711 Unilateral primary osteoarthritis, right knee: Secondary | ICD-10-CM | POA: Insufficient documentation

## 2017-11-21 DIAGNOSIS — M25561 Pain in right knee: Secondary | ICD-10-CM | POA: Diagnosis not present

## 2017-11-21 DIAGNOSIS — Z96652 Presence of left artificial knee joint: Secondary | ICD-10-CM | POA: Diagnosis not present

## 2017-11-21 DIAGNOSIS — G8929 Other chronic pain: Secondary | ICD-10-CM

## 2017-11-21 DIAGNOSIS — M25562 Pain in left knee: Secondary | ICD-10-CM

## 2017-11-21 MED ORDER — LIDOCAINE HCL 1 % IJ SOLN
5.0000 mL | INTRAMUSCULAR | Status: AC | PRN
  Administered 2017-11-21: 5 mL

## 2017-11-21 MED ORDER — METHYLPREDNISOLONE ACETATE 40 MG/ML IJ SUSP
40.0000 mg | INTRAMUSCULAR | Status: AC | PRN
  Administered 2017-11-21: 40 mg via INTRA_ARTICULAR

## 2017-11-21 NOTE — Progress Notes (Signed)
Office Visit Note   Patient: [name]           Date of Birth: 10-21-1940           MRN: [id number] Visit Date: 11/21/2017              Requested by: Lucianne Lei, MD [address] [place], [place] [address] PCP: Lucianne Lei, MD  Chief Complaint  Patient presents with  . Left Knee - Pain  . Right Knee - Pain      HPI: Patient is a 77 year old woman who lives status post revision of her left total knee arthroplasty with a stemmed femoral and tibial component she complains of global pain around the left knee she denies any systemic symptoms denies any redness cellulitis fever or chills.  Patient states she is been having some increasing pain in the right knee with mechanical catching locking and giving way.  Assessment & Plan: Visit Diagnoses:  1. Chronic pain of right knee   2. Chronic pain of left knee   3. Unilateral primary osteoarthritis, right knee   4. Status post revision of total knee, left     Plan: Right knee was injected she tolerated this well recommended strengthening at the gym for the left knee and right knee.  Patient states she does work out at [employer].  Discussed that if she is symptomatic in a month that she should follow-up and we could order some hyaluronic acid injections.  Patient is reluctant to proceed with an additional injection at this time.  Follow-Up Instructions: No follow-ups on file.   Ortho Exam  Patient is alert, oriented, no adenopathy, well-dressed, normal affect, normal respiratory effort. Examination patient has an antalgic gait.  She only has range of motion from about 10 to 45 degrees of the left knee.  Collateral ligaments are stable there is some mild swelling but no redness no warmth no tenderness to light palpation.  Examination of the right knee she has crepitation with range of motion she is tender palpation of medial lateral joint line as well as the patellofemoral joint.  Collaterals and cruciates are stable there is no  effusion.  Imaging: Xr Knee 1-2 Views Left  Result Date: 11/21/2017 2 view radiographs of the left knee shows revision total knee arthroplasty.  There is no hardware failure there is some chronic lucency around the stems.  There are large osteophytic bone spurs posteriorly.  Xr Knee 1-2 Views Right  Result Date: 11/21/2017 2 view radiographs of the right knee shows joint space narrowing medial lateral joint lines with periarticular bony spurs as well as large bony spurs of the patellofemoral joint.  No images are attached to the encounter.  Labs: No results found for: HGBA1C, ESRSEDRATE, CRP, LABURIC, REPTSTATUS, GRAMSTAIN, CULT, LABORGA   No results found for: ALBUMIN, PREALBUMIN, LABURIC  Body mass index is 33.95 kg/m.  Orders:  Orders Placed This Encounter  Procedures  . Large Joint Inj  . XR Knee 1-2 Views Right  . XR Knee 1-2 Views Left   No orders of the defined types were placed in this encounter.    Procedures: Large Joint Inj: R knee on 11/21/2017 9:00 AM Indications: pain and diagnostic evaluation Details: 22 G 1.5 in needle, anteromedial approach  Arthrogram: No  Medications: 5 mL lidocaine 1 %; 40 mg methylPREDNISolone acetate 40 MG/ML Outcome: tolerated well, no immediate complications Procedure, treatment alternatives, risks and benefits explained, specific risks discussed. Consent was given by  the patient. Immediately prior to procedure a time out was called to verify the correct patient, procedure, equipment, support staff and site/side marked as required. Patient was prepped and draped in the usual sterile fashion.      Clinical Data: No additional findings.  ROS:  All other systems negative, except as noted in the HPI. Review of Systems  Objective: Vital Signs: Ht 5\' 5"  (1.651 m)   Wt 204 lb (92.5 kg)   BMI 33.95 kg/m   Specialty Comments:  No specialty comments available.  PMFS History: Patient Active Problem List   Diagnosis Date Noted    . Unilateral primary osteoarthritis, right knee 11/21/2017  . Status post revision of total knee, left 11/21/2017  . Chronic rhinitis 02/25/2016  . Cough 02/25/2016  . Hoarseness of voice 02/25/2016  . Chronic leukopenia 12/15/2015   Past Medical History:  Diagnosis Date  . Asthma    allergic induced  . Cough   . Hypertension   . Rhinitis     Family History  Problem Relation Age of Onset  . Other Mother        died when patient was 32 months old  . Hypertension Father   . Cerebral aneurysm Sister   . Breast cancer Sister   . Lung cancer Sister   . Healthy Son   . Allergic rhinitis Neg Hx   . Angioedema Neg Hx   . Asthma Neg Hx   . Eczema Neg Hx   . Immunodeficiency Neg Hx   . Urticaria Neg Hx     Past Surgical History:  Procedure Laterality Date  . ABDOMINAL HYSTERECTOMY    . BACK SURGERY    . CATARACT EXTRACTION    . MASS EXCISION  01/05/2012   Procedure: MINOR EXCISION OF MASS;  Surgeon: Cammie Sickle., MD;  Location: [facility];  Service: Orthopedics;  Laterality: Right;  excision mucoid cyst right long finger, debride DIP joint  . ROTATOR CUFF REPAIR Bilateral   . TOTAL KNEE ARTHROPLASTY Left    twice   Social History   Occupational History  . Occupation: retired    Comment: Warehouse manager  Tobacco Use  . Smoking status: Never Smoker  . Smokeless tobacco: Never Used  Substance and Sexual Activity  . Alcohol use: No    Alcohol/week: 0.0 oz  . Drug use: No  . Sexual activity: Not on file    Comment: retired from [employer]. 1 son.

## 2017-11-22 ENCOUNTER — Ambulatory Visit (INDEPENDENT_AMBULATORY_CARE_PROVIDER_SITE_OTHER): Payer: Medicare Other | Admitting: Orthopedic Surgery

## 2017-12-04 DIAGNOSIS — L680 Hirsutism: Secondary | ICD-10-CM | POA: Diagnosis not present

## 2017-12-04 DIAGNOSIS — L658 Other specified nonscarring hair loss: Secondary | ICD-10-CM | POA: Diagnosis not present

## 2017-12-04 DIAGNOSIS — L669 Cicatricial alopecia, unspecified: Secondary | ICD-10-CM | POA: Diagnosis not present

## 2017-12-04 DIAGNOSIS — L659 Nonscarring hair loss, unspecified: Secondary | ICD-10-CM | POA: Diagnosis not present

## 2017-12-25 ENCOUNTER — Ambulatory Visit (INDEPENDENT_AMBULATORY_CARE_PROVIDER_SITE_OTHER): Payer: Medicare Other | Admitting: Orthopedic Surgery

## 2018-01-04 DIAGNOSIS — G894 Chronic pain syndrome: Secondary | ICD-10-CM | POA: Diagnosis not present

## 2018-01-04 DIAGNOSIS — M25562 Pain in left knee: Secondary | ICD-10-CM | POA: Diagnosis not present

## 2018-01-04 DIAGNOSIS — M25561 Pain in right knee: Secondary | ICD-10-CM | POA: Diagnosis not present

## 2018-01-04 DIAGNOSIS — M545 Low back pain: Secondary | ICD-10-CM | POA: Diagnosis not present

## 2018-01-05 ENCOUNTER — Other Ambulatory Visit: Payer: Self-pay | Admitting: Allergy & Immunology

## 2018-02-22 ENCOUNTER — Ambulatory Visit: Payer: Medicare Other | Admitting: Allergy

## 2018-02-28 ENCOUNTER — Ambulatory Visit: Payer: Medicare Other | Admitting: Allergy

## 2018-03-08 ENCOUNTER — Encounter: Payer: Self-pay | Admitting: Allergy

## 2018-03-08 ENCOUNTER — Ambulatory Visit (INDEPENDENT_AMBULATORY_CARE_PROVIDER_SITE_OTHER): Payer: Medicare Other | Admitting: Allergy

## 2018-03-08 VITALS — BP 124/78 | HR 91 | Resp 16 | Ht 65.5 in | Wt 209.0 lb

## 2018-03-08 DIAGNOSIS — Z23 Encounter for immunization: Secondary | ICD-10-CM | POA: Diagnosis not present

## 2018-03-08 DIAGNOSIS — R05 Cough: Secondary | ICD-10-CM | POA: Diagnosis not present

## 2018-03-08 DIAGNOSIS — J4520 Mild intermittent asthma, uncomplicated: Secondary | ICD-10-CM

## 2018-03-08 DIAGNOSIS — J310 Chronic rhinitis: Secondary | ICD-10-CM | POA: Diagnosis not present

## 2018-03-08 DIAGNOSIS — R059 Cough, unspecified: Secondary | ICD-10-CM

## 2018-03-08 MED ORDER — AZELASTINE HCL 0.1 % NA SOLN: 2.0000 | Freq: Two times a day (BID) | NASAL | 1 refills | Status: DC

## 2018-03-08 MED ORDER — LEVALBUTEROL HCL 1.25 MG/3ML IN NEBU: 1.2500 mg | INHALATION_SOLUTION | RESPIRATORY_TRACT | 5 refills | Status: DC | PRN

## 2018-03-08 MED ORDER — BUDESONIDE-FORMOTEROL FUMARATE 80-4.5 MCG/ACT IN AERO: 2.0000 | INHALATION_SPRAY | Freq: Two times a day (BID) | RESPIRATORY_TRACT | 5 refills | Status: DC

## 2018-03-08 MED ORDER — MONTELUKAST SODIUM 10 MG PO TABS: 10.0000 mg | ORAL_TABLET | Freq: Every day | ORAL | 1 refills | Status: DC

## 2018-03-08 MED ORDER — CARBINOXAMINE MALEATE 6 MG PO TABS: 1.0000 | ORAL_TABLET | Freq: Three times a day (TID) | ORAL | 3 refills | Status: DC

## 2018-03-08 MED ORDER — BECLOMETHASONE DIPROPIONATE 80 MCG/ACT NA AERS: INHALATION_SPRAY | NASAL | 1 refills | Status: DC

## 2018-03-08 NOTE — Patient Instructions (Addendum)
Cough and rhinitis  - improved but still symptomatic  - post nasal drip leading to throat irritation and cough  - will need to do a prior authorization for Qnasl to get it covered for you   - will prescribe nasal antihistamine Astelin 2 sprays each nostril 1-2 times a day for nasal drainage.    - continue use of saline wash prior to using your nasal spray   - continue singulair 10mg  at bedtime  - use Xopenex 2 inhalations every 4-6 hours as needed for cough/wheeze/shortness of breath/chest tightness.  This is a quick relief medication.     - continue Symbicort 75mcg 2 puffs twice a day   - continue RyVent 6mg  and may take up to twice a day to help with nasal drainage.    Follow-up 6 months or sooner if needed

## 2018-03-08 NOTE — Progress Notes (Signed)
Follow-up Note  RE: [name] MRN: [id number] DOB: 17-Jan-1941 Date of Office Visit: 03/08/2018   History of present illness: [name] is a 77 y.o. female presenting today for follow-up of cough and chronic rhinitis.  She was last seen on August 30, 2017 by myself. She has been doing okay since her last visit, has not had any illnesses or recent health issues.  She has still been sneezing, runny nose with cough, itchy eyes. Denies any teary/watery eyes or sinus pressure. Her rhinitis is constant, but being outside makes it worse and she reports that she likes to garden outside a lot. Qnasl was discontinued by her insurance as is no longer covered and it became too expensive thus she has not used in 2 months.  Her symptoms were better when she was taking it. She has been using nasal saline and she reports that it helps a little bit. I also started her on RyVent twice a day and she does feel like this is helping with her rhinitis . She has symptoms year round but occurs more in the Spring and the Fall.   At her last visit I switched her from Qvar to Symbicort for improved controlled.  She feels her breathing has been good and has been able to walk around with no issues.  She states she recently traveled to [place] and was walking up and down hills a lot and did not respiratory symptoms. She denies wheezing, shortness of breath. Does have a dry cough, often in the morning. Uses Symbicort once a day, uses Singulair once a day. She reports that she has not needed to use the Xopenex.    Review of systems: Review of Systems  Constitutional: Negative for chills, fever and malaise/fatigue.  HENT: Positive for congestion. Negative for ear discharge, ear pain, nosebleeds and sore throat.   Eyes: Negative for pain, discharge and redness.  Respiratory: Negative for cough, shortness of breath and wheezing.   Cardiovascular: Negative for chest pain.  Gastrointestinal: Negative for abdominal  pain, constipation, diarrhea, heartburn, nausea and vomiting.  Musculoskeletal: Negative for joint pain.  Skin: Negative for itching and rash.  Neurological: Negative for headaches.    All other systems negative unless noted above in HPI  Past medical/social/surgical/family history have been reviewed and are unchanged unless specifically indicated below.  No changes  Medication List: Allergies as of 03/08/2018      Reactions   Latex Shortness Of Breath      Medication List        Accurate as of 03/08/18  4:48 PM. Always use your most recent med list.          azelastine 0.1 % nasal spray Commonly known as:  ASTELIN Place 2 sprays into both nostrils 2 (two) times daily.   beclomethasone 80 MCG/ACT inhaler Commonly known as:  QVAR Inhale into the lungs.   Beclomethasone Dipropionate 80 MCG/ACT Aers 1-2 actuation twice daily   betamethasone dipropionate 0.05 % ointment Commonly known as:  DIPROLENE   budesonide-formoterol 80-4.5 MCG/ACT inhaler Commonly known as:  SYMBICORT Inhale 2 puffs into the lungs 2 (two) times daily.   CALCIUM MAGNESIUM 750 300-300 MG Tabs Generic drug:  Calcium-Magnesium Take by mouth.   Carbinoxamine Maleate 6 MG Tabs Take 1 tablet by mouth every 8 (eight) hours.   CLEOCIN-T 1 % lotion Generic drug:  clindamycin   clobetasol cream 0.05 % Commonly known as:  TEMOVATE   docusate sodium 100 MG capsule Commonly  known as:  COLACE Take 100 mg by mouth daily as needed for mild constipation.   levalbuterol 1.25 MG/3ML nebulizer solution Commonly known as:  XOPENEX Take 1.25 mg by nebulization every 4 (four) hours as needed for wheezing.   LIVALO 4 MG Tabs Generic drug:  Pitavastatin Calcium Take by mouth.   montelukast 10 MG tablet Commonly known as:  SINGULAIR Take 1 tablet (10 mg total) by mouth at bedtime.   multivitamin-iron-minerals-folic acid chewable tablet Chew 1 tablet by mouth daily.   niacin 500 MG CR tablet Commonly  known as:  NIASPAN Take 500 mg by mouth at bedtime.   olmesartan-hydrochlorothiazide 20-12.5 MG tablet Commonly known as:  BENICAR HCT Take 1 tablet by mouth daily.   PENNSAID 1.5 % Soln Generic drug:  Diclofenac Sodium Place onto the skin. Reported on 08/26/2015   primidone 50 MG tablet Commonly known as:  MYSOLINE Take by mouth.   Vitamin C 250 MG Chew Chew 250 mg by mouth.   WELCHOL 3.75 g Pack Generic drug:  Colesevelam HCl       Known medication allergies: Allergies  Allergen Reactions  . Latex Shortness Of Breath     Physical examination: Blood pressure 124/78, pulse 91, resp. rate 16, height 5' 5.5" (1.664 m), weight 209 lb (94.8 kg), SpO2 96 %.  General: Alert, interactive, in no acute distress. HEENT: PERRLA, TMs pearly gray, turbinates non-edematous without discharge, post-pharynx mildly erythematous. Neck: Supple without lymphadenopathy. Lungs: Clear to auscultation without wheezing, rhonchi or rales. {no increased work of breathing. CV: Normal S1, S2 without murmurs. Abdomen: Nondistended, nontender. Skin: Warm and dry, without lesions or rashes. Extremities:  No clubbing, cyanosis or edema. Neuro:   Grossly intact.  Diagnositics/Labs:  Spirometry: FEV1: 1.56L 83%, FVC: 1.84L 75%, ratio consistent with nonobstructive pattern for age  Assessment and plan:   Rhinitis and Cough  - improved but still symptomatic  - post nasal drip leading to throat irritation and cough  - will need to do a prior authorization for Qnasl to get it covered for you   - will prescribe nasal antihistamine Astelin 2 sprays each nostril 1-2 times a day for nasal drainage.    - continue use of saline wash prior to using your nasal spray   - continue singulair 10mg  at bedtime  - use Xopenex 2 inhalations every 4-6 hours as needed for cough/wheeze/shortness of breath/chest tightness.  This is a quick relief medication.     - continue Symbicort 67mcg 2 puffs twice a day   -  continue RyVent 6mg  and may take up to twice a day to help with nasal drainage.    Follow-up 6 months or sooner if needed  I appreciate the opportunity to take part in [name]'s care. Please do not hesitate to contact me with questions.  Sincerely,   Prudy Feeler, MD Allergy/Immunology [facility]

## 2018-03-09 ENCOUNTER — Telehealth: Payer: Self-pay

## 2018-03-09 NOTE — Telephone Encounter (Signed)
PA Initiated through [facility] and needs Dr. Nelva Bush needs to sign to send back to [facility]

## 2018-03-09 NOTE — Addendum Note (Signed)
Addended by: Herbie Drape on: 03/09/2018 04:33 PM   Modules accepted: Orders

## 2018-03-12 NOTE — Addendum Note (Signed)
Addended by: Lucrezia Starch I on: 03/12/2018 06:28 AM   Modules accepted: Orders

## 2018-03-12 NOTE — Telephone Encounter (Signed)
PA request for levalbuterol 1.25mg /67mL. Patient's insurance will not cover "cough" diagnosis. Please advise on alternative diagnosis for prescription to be covered. Thank you.

## 2018-03-13 DIAGNOSIS — R251 Tremor, unspecified: Secondary | ICD-10-CM | POA: Diagnosis not present

## 2018-03-13 DIAGNOSIS — E782 Mixed hyperlipidemia: Secondary | ICD-10-CM | POA: Diagnosis not present

## 2018-03-13 DIAGNOSIS — D72818 Other decreased white blood cell count: Secondary | ICD-10-CM | POA: Diagnosis not present

## 2018-03-13 DIAGNOSIS — I10 Essential (primary) hypertension: Secondary | ICD-10-CM | POA: Diagnosis not present

## 2018-03-13 DIAGNOSIS — E119 Type 2 diabetes mellitus without complications: Secondary | ICD-10-CM | POA: Diagnosis not present

## 2018-03-13 MED ORDER — LEVALBUTEROL HCL 1.25 MG/3ML IN NEBU: 1.2500 mg | INHALATION_SOLUTION | RESPIRATORY_TRACT | 5 refills | Status: DC | PRN

## 2018-03-13 NOTE — Addendum Note (Signed)
Addended by: Lucrezia Starch I on: 03/13/2018 08:29 AM   Modules accepted: Orders

## 2018-03-13 NOTE — Telephone Encounter (Signed)
I added in "reactive airway disease mild intermittent without complication" as diagnosis along with cough.

## 2018-03-13 NOTE — Telephone Encounter (Signed)
New prescription with diagnosis code has been sent in.

## 2018-03-13 NOTE — Addendum Note (Signed)
Addended by: Lucrezia Starch I on: 03/13/2018 01:23 PM   Modules accepted: Orders

## 2018-03-14 NOTE — Progress Notes (Signed)
[name] was seen today in the movement disorders clinic for neurologic consultation at the request of Lucianne Lei, MD.  The consultation is for the evaluation of tremor for 6 months.  The records that were made available to me were reviewed.   States that she saw Dr. Mee Hives at [facility].   She states that she was told she had "silent tremor."  She was given primidone but she didn't take it because she wanted a 2nd opinion.  03/16/18 update: Patient is seen today in follow-up for essential tremor.  She was started on primidone last visit.  She reports today that she is only on a 1/2 tablet at night.  It helped but not enough.  "I was worried about taking more."  Records are reviewed since last visit.  She has seen several specialists including orthopedics, dermatology and allergy.  PREVIOUS MEDICATIONS: none to date  ALLERGIES:   Allergies  Allergen Reactions  . Latex Shortness Of Breath    CURRENT MEDICATIONS:  Outpatient Encounter Medications as of 03/16/2018  Medication Sig  . Ascorbic Acid (VITAMIN C) 250 MG CHEW Chew 250 mg by mouth.  Marland Kitchen azelastine (ASTELIN) 0.1 % nasal spray Place 2 sprays into both nostrils 2 (two) times daily.  . beclomethasone (QVAR) 80 MCG/ACT inhaler Inhale into the lungs.  . Beclomethasone Dipropionate (QNASL) 80 MCG/ACT AERS 1-2 actuation twice daily  . betamethasone dipropionate (DIPROLENE) 0.05 % ointment   . budesonide-formoterol (SYMBICORT) 80-4.5 MCG/ACT inhaler Inhale 2 puffs into the lungs 2 (two) times daily.  . Calcium-Magnesium (CALCIUM MAGNESIUM 750) 300-300 MG TABS Take by mouth.  . Carbinoxamine Maleate (RYVENT) 6 MG TABS Take 1 tablet by mouth every 8 (eight) hours.  . clindamycin (CLEOCIN-T) 1 % lotion   . clobetasol cream (TEMOVATE) 0.05 %   . Colesevelam HCl (WELCHOL) 3.75 g PACK   . Diclofenac Sodium (PENNSAID) 1.5 % SOLN Place onto the skin. Reported on 08/26/2015  . docusate sodium (COLACE) 100 MG capsule Take 100 mg by  mouth daily as needed for mild constipation.  Marland Kitchen levalbuterol (XOPENEX) 1.25 MG/3ML nebulizer solution Take 1.25 mg by nebulization every 4 (four) hours as needed for wheezing.  . montelukast (SINGULAIR) 10 MG tablet Take 1 tablet (10 mg total) by mouth at bedtime.  . multivitamin-iron-minerals-folic acid (CENTRUM) chewable tablet Chew 1 tablet by mouth daily.  . niacin (NIASPAN) 500 MG CR tablet Take 500 mg by mouth at bedtime.  Marland Kitchen olmesartan-hydrochlorothiazide (BENICAR HCT) 20-12.5 MG per tablet Take 1 tablet by mouth daily.  . Pitavastatin Calcium (LIVALO) 4 MG TABS Take by mouth.  . primidone (MYSOLINE) 50 MG tablet Take 25 mg by mouth at bedtime.    No facility-administered encounter medications on file as of 03/16/2018.     PAST MEDICAL HISTORY:   Past Medical History:  Diagnosis Date  . Asthma    allergic induced  . Cough   . Hypertension   . Rhinitis     PAST SURGICAL HISTORY:   Past Surgical History:  Procedure Laterality Date  . ABDOMINAL HYSTERECTOMY    . BACK SURGERY    . CATARACT EXTRACTION    . MASS EXCISION  01/05/2012   Procedure: MINOR EXCISION OF MASS;  Surgeon: Cammie Sickle., MD;  Location: [place];  Service: Orthopedics;  Laterality: Right;  excision mucoid cyst right long finger, debride DIP joint  . ROTATOR CUFF REPAIR Bilateral   . TOTAL KNEE ARTHROPLASTY Left    twice  SOCIAL HISTORY:   Social History   Socioeconomic History  . Marital status: Divorced    Spouse name: Not on file  . Number of children: Not on file  . Years of education: Not on file  . Highest education level: Not on file  Occupational History  . Occupation: retired    Comment: Warehouse manager  Social Needs  . Financial resource strain: Not on file  . Food insecurity:    Worry: Not on file    Inability: Not on file  . Transportation needs:    Medical: Not on file    Non-medical: Not on file  Tobacco Use  . Smoking status: Never Smoker  . Smokeless  tobacco: Never Used  Substance and Sexual Activity  . Alcohol use: No    Alcohol/week: 0.0 standard drinks  . Drug use: No  . Sexual activity: Not on file    Comment: retired from [employer]. 1 son.  Lifestyle  . Physical activity:    Days per week: Not on file    Minutes per session: Not on file  . Stress: Not on file  Relationships  . Social connections:    Talks on phone: Not on file    Gets together: Not on file    Attends religious service: Not on file    Active member of club or organization: Not on file    Attends meetings of clubs or organizations: Not on file    Relationship status: Not on file  . Intimate partner violence:    Fear of current or ex partner: Not on file    Emotionally abused: Not on file    Physically abused: Not on file    Forced sexual activity: Not on file  Other Topics Concern  . Not on file  Social History Narrative  . Not on file    FAMILY HISTORY:   Family Status  Relation Name Status  . Mother  Deceased  . Father  Deceased  . Sister 2 Alive  . Sister  Deceased  . Son  Alive  . Neg Hx  (Not Specified)    ROS:  A complete 10 system review of systems was obtained and was unremarkable apart from what is mentioned above.  PHYSICAL EXAMINATION:    VITALS:   Vitals:   03/16/18 1538  BP: 132/78  Pulse: 92  SpO2: 95%  Weight: 203 lb (92.1 kg)  Height: 5' 7.5" (1.715 m)    GEN:  The patient appears stated age and is in NAD. HEENT:  Normocephalic, atraumatic.  The mucous membranes are moist. The superficial temporal arteries are without ropiness or tenderness. CV:  RRR Lungs:  CTAB Neck/HEME:  There are no carotid bruits bilaterally.  Neurological examination:  Orientation: The patient is alert and oriented x3. Cranial nerves: There is good facial symmetry. The speech is fluent and clear. Soft palate rises symmetrically and there is no tongue deviation. Hearing is intact to conversational tone. Sensation: Sensation is  intact to light touch throughout Motor: Strength is 5/5 in the bilateral upper and lower extremities.   Shoulder shrug is equal and symmetric.  There is no pronator drift.  Movement examination: Tone: There is normal tone in the upper and lower extremities. Abnormal movements: There is no rest tremor.  There is no postural tremor.  When given a weight, she does have tremor in the left upper extremity, overall mild.  She does not have intention tremor.  She has evidence of mild tremor when  doing Archimedes spirals on the left only.  There is slightly improved from last visit. Coordination:  There is no decremation with RAM's, with any form of RAMS, including alternating supination and pronation of the forearm, hand opening and closing, finger taps, heel taps and toe taps. Gait and Station: The patient has an antalgic gait and does not bend the left knee with ambulation.   Labs: Lab work was reviewed.  It is dated August 14, 2017.  Sodium is 141, potassium 4.1, chloride 104, CO2 30, BUN 9, creatinine 0.84.  Hemoglobin A1c was 5.8.  White blood cells were 2.2 (Dr. Alvy Bimler for leukopenia) hemoglobin 14.8, hematocrit 43.6 and platelets 177.  Lab Results  Component Value Date   TSH 1.79 11/10/2017      ASSESSMENT/PLAN:  1.    Essential Tremor.  -We discussed nature and pathophysiology.  We discussed that this can continue to gradually get worse with time.  We discussed that some medications can worsen this, as can caffeine use.  We discussed medication therapy as well as surgical therapy.  She was shown HIPAA compliant videos of patients who have had surgery.  We discussed that her tremor is asymmetric, which occurs in about 30% of cases.  Told the patient that she can increase the primidone to 50 mg nightly.  She has been worried because she read about side effects in the package insert, including leukopenia and she has baseline leukopenia.  She was assured that this is a low dose and likely will  not affect her counts.  2.  Leukopenia, chronic  -Records from hematology are reviewed.  It is felt that she has chronic, constitutional leukopenia which is common in patients of African-American heritage.  She has had an extensive work-up in the past including bone marrow aspirate and biopsy, which were negative.  3.  Follow-up in 6 months.  Cc:  Lucianne Lei, MD

## 2018-03-15 DIAGNOSIS — E782 Mixed hyperlipidemia: Secondary | ICD-10-CM | POA: Diagnosis not present

## 2018-03-15 DIAGNOSIS — R5383 Other fatigue: Secondary | ICD-10-CM | POA: Diagnosis not present

## 2018-03-15 DIAGNOSIS — Z6833 Body mass index (BMI) 33.0-33.9, adult: Secondary | ICD-10-CM | POA: Diagnosis not present

## 2018-03-15 DIAGNOSIS — E119 Type 2 diabetes mellitus without complications: Secondary | ICD-10-CM | POA: Diagnosis not present

## 2018-03-15 DIAGNOSIS — I10 Essential (primary) hypertension: Secondary | ICD-10-CM | POA: Diagnosis not present

## 2018-03-16 ENCOUNTER — Encounter: Payer: Self-pay | Admitting: Neurology

## 2018-03-16 ENCOUNTER — Ambulatory Visit (INDEPENDENT_AMBULATORY_CARE_PROVIDER_SITE_OTHER): Payer: Medicare Other | Admitting: Neurology

## 2018-03-16 VITALS — BP 132/78 | HR 92 | Ht 67.5 in | Wt 203.0 lb

## 2018-03-16 DIAGNOSIS — G250 Essential tremor: Secondary | ICD-10-CM | POA: Diagnosis not present

## 2018-03-16 DIAGNOSIS — D72819 Decreased white blood cell count, unspecified: Secondary | ICD-10-CM | POA: Diagnosis not present

## 2018-03-20 ENCOUNTER — Other Ambulatory Visit: Payer: Self-pay | Admitting: Allergy

## 2018-04-05 DIAGNOSIS — M25561 Pain in right knee: Secondary | ICD-10-CM | POA: Diagnosis not present

## 2018-04-05 DIAGNOSIS — M545 Low back pain: Secondary | ICD-10-CM | POA: Diagnosis not present

## 2018-04-05 DIAGNOSIS — G894 Chronic pain syndrome: Secondary | ICD-10-CM | POA: Diagnosis not present

## 2018-04-05 DIAGNOSIS — M25562 Pain in left knee: Secondary | ICD-10-CM | POA: Diagnosis not present

## 2018-04-08 ENCOUNTER — Other Ambulatory Visit: Payer: Self-pay | Admitting: Allergy & Immunology

## 2018-04-18 ENCOUNTER — Telehealth: Payer: Self-pay | Admitting: Neurology

## 2018-04-18 DIAGNOSIS — I10 Essential (primary) hypertension: Secondary | ICD-10-CM | POA: Diagnosis not present

## 2018-04-18 DIAGNOSIS — R0602 Shortness of breath: Secondary | ICD-10-CM | POA: Diagnosis not present

## 2018-04-18 DIAGNOSIS — R5383 Other fatigue: Secondary | ICD-10-CM | POA: Diagnosis not present

## 2018-04-18 DIAGNOSIS — R5381 Other malaise: Secondary | ICD-10-CM | POA: Diagnosis not present

## 2018-04-18 MED ORDER — PRIMIDONE 50 MG PO TABS: 50.0000 mg | ORAL_TABLET | Freq: Every day | ORAL | 5 refills | Status: DC

## 2018-04-18 NOTE — Telephone Encounter (Signed)
I believe this should say 30 tabs, as patient just increased to a full tablet of primidone at bedtime. RX sent to pharmacy.

## 2018-04-18 NOTE — Telephone Encounter (Signed)
Patient left a vm regarding a refill on her primidone 50mg  3 tabs to the [facility] on [address]. Thanks!

## 2018-04-19 ENCOUNTER — Encounter (INDEPENDENT_AMBULATORY_CARE_PROVIDER_SITE_OTHER): Payer: Self-pay | Admitting: Orthopedic Surgery

## 2018-04-19 ENCOUNTER — Ambulatory Visit (INDEPENDENT_AMBULATORY_CARE_PROVIDER_SITE_OTHER): Payer: Medicare Other

## 2018-04-19 ENCOUNTER — Ambulatory Visit (INDEPENDENT_AMBULATORY_CARE_PROVIDER_SITE_OTHER): Payer: Medicare Other | Admitting: Orthopedic Surgery

## 2018-04-19 VITALS — Ht 67.5 in | Wt 203.0 lb

## 2018-04-19 DIAGNOSIS — Z96652 Presence of left artificial knee joint: Secondary | ICD-10-CM

## 2018-04-19 DIAGNOSIS — M25561 Pain in right knee: Secondary | ICD-10-CM

## 2018-04-19 DIAGNOSIS — G8929 Other chronic pain: Secondary | ICD-10-CM

## 2018-04-24 ENCOUNTER — Encounter (INDEPENDENT_AMBULATORY_CARE_PROVIDER_SITE_OTHER): Payer: Self-pay | Admitting: Orthopedic Surgery

## 2018-04-24 DIAGNOSIS — M25561 Pain in right knee: Secondary | ICD-10-CM | POA: Diagnosis not present

## 2018-04-24 DIAGNOSIS — G8929 Other chronic pain: Secondary | ICD-10-CM | POA: Diagnosis not present

## 2018-04-24 MED ORDER — METHYLPREDNISOLONE ACETATE 40 MG/ML IJ SUSP
40.0000 mg | INTRAMUSCULAR | Status: AC | PRN
  Administered 2018-04-24: 40 mg via INTRA_ARTICULAR

## 2018-04-24 MED ORDER — LIDOCAINE HCL 1 % IJ SOLN
5.0000 mL | INTRAMUSCULAR | Status: AC | PRN
  Administered 2018-04-24: 5 mL

## 2018-04-24 NOTE — Progress Notes (Signed)
Office Visit Note   Patient: [name]           Date of Birth: 08/05/1940           MRN: [id number] Visit Date: 04/19/2018              Requested by: Lucianne Lei, MD [address] [place], [place] [address] PCP: Lucianne Lei, MD  Chief Complaint  Patient presents with  . Left Knee - Follow-up, Pain  . Right Knee - Follow-up, Pain      HPI: Patient is a 77 year old woman who is status post 2 total knee surgeries on the left knee which she states were performed at [facility].  Patient also complains of start up pain in the morning with the right knee.  Patient states that her knee pain is a 7/10 has been getting worse has been using ice elevation and water aerobics patient is taking Voltaren gel.  Assessment & Plan: Visit Diagnoses:  1. Chronic pain of right knee   2. Status post revision of total knee, left     Plan: The right knee was injected reevaluate in 4 weeks.  Discussed the importance of quad strengthening continue to use a cane.  Follow-Up Instructions: Return in about 4 weeks (around 05/17/2018).   Ortho Exam  Patient is alert, oriented, no adenopathy, well-dressed, normal affect, normal respiratory effort. Examination the left knee is straight no collateral ligament instability.  Patient lacks about 10 degrees to full extension of the left knee.  Right knee has range of motion from 0 to 105 degrees collaterals and cruciates are stable in the right knee there is crepitation range of motion the right knee.  Medial lateral joint lines are tender to palpation.  Left knee has no redness or cellulitis no signs of infection.  Imaging: No results found. No images are attached to the encounter.  Labs: No results found for: HGBA1C, ESRSEDRATE, CRP, LABURIC, REPTSTATUS, GRAMSTAIN, CULT, LABORGA   No results found for: ALBUMIN, PREALBUMIN, LABURIC  Body mass index is 31.33 kg/m.  Orders:  Orders Placed This Encounter  Procedures  . Large Joint  Inj  . XR Knee 1-2 Views Right  . XR Knee 1-2 Views Left   No orders of the defined types were placed in this encounter.    Procedures: Large Joint Inj: R knee on 04/24/2018 2:56 PM Indications: pain and diagnostic evaluation Details: 22 G 1.5 in needle, anteromedial approach  Arthrogram: No  Medications: 5 mL lidocaine 1 %; 40 mg methylPREDNISolone acetate 40 MG/ML Outcome: tolerated well, no immediate complications Procedure, treatment alternatives, risks and benefits explained, specific risks discussed. Consent was given by the patient. Immediately prior to procedure a time out was called to verify the correct patient, procedure, equipment, support staff and site/side marked as required. Patient was prepped and draped in the usual sterile fashion.      Clinical Data: No additional findings.  ROS:  All other systems negative, except as noted in the HPI. Review of Systems  Objective: Vital Signs: Ht 5' 7.5" (1.715 m)   Wt 203 lb (92.1 kg)   BMI 31.33 kg/m   Specialty Comments:  No specialty comments available.  PMFS History: Patient Active Problem List   Diagnosis Date Noted  . Unilateral primary osteoarthritis, right knee 11/21/2017  . Status post revision of total knee, left 11/21/2017  . Chronic rhinitis 02/25/2016  . Cough 02/25/2016  . Hoarseness of voice 02/25/2016  . Chronic leukopenia  12/15/2015   Past Medical History:  Diagnosis Date  . Asthma    allergic induced  . Cough   . Hypertension   . Rhinitis     Family History  Problem Relation Age of Onset  . Other Mother        died when patient was 23 months old  . Hypertension Father   . Cerebral aneurysm Sister   . Breast cancer Sister   . Lung cancer Sister   . Healthy Son   . Allergic rhinitis Neg Hx   . Angioedema Neg Hx   . Asthma Neg Hx   . Eczema Neg Hx   . Immunodeficiency Neg Hx   . Urticaria Neg Hx     Past Surgical History:  Procedure Laterality Date  . ABDOMINAL HYSTERECTOMY     . BACK SURGERY    . CATARACT EXTRACTION    . MASS EXCISION  01/05/2012   Procedure: MINOR EXCISION OF MASS;  Surgeon: Cammie Sickle., MD;  Location: [place];  Service: Orthopedics;  Laterality: Right;  excision mucoid cyst right long finger, debride DIP joint  . ROTATOR CUFF REPAIR Bilateral   . TOTAL KNEE ARTHROPLASTY Left    twice   Social History   Occupational History  . Occupation: retired    Comment: Warehouse manager  Tobacco Use  . Smoking status: Never Smoker  . Smokeless tobacco: Never Used  Substance and Sexual Activity  . Alcohol use: No    Alcohol/week: 0.0 standard drinks  . Drug use: No  . Sexual activity: Not on file    Comment: retired from [employer]. 1 son.

## 2018-04-26 DIAGNOSIS — E119 Type 2 diabetes mellitus without complications: Secondary | ICD-10-CM | POA: Diagnosis not present

## 2018-04-26 DIAGNOSIS — E782 Mixed hyperlipidemia: Secondary | ICD-10-CM | POA: Diagnosis not present

## 2018-04-26 DIAGNOSIS — E785 Hyperlipidemia, unspecified: Secondary | ICD-10-CM | POA: Diagnosis not present

## 2018-04-26 DIAGNOSIS — Z6834 Body mass index (BMI) 34.0-34.9, adult: Secondary | ICD-10-CM | POA: Diagnosis not present

## 2018-04-26 DIAGNOSIS — I10 Essential (primary) hypertension: Secondary | ICD-10-CM | POA: Diagnosis not present

## 2018-05-07 DIAGNOSIS — R0602 Shortness of breath: Secondary | ICD-10-CM | POA: Diagnosis not present

## 2018-05-09 DIAGNOSIS — R0602 Shortness of breath: Secondary | ICD-10-CM | POA: Diagnosis not present

## 2018-05-23 DIAGNOSIS — R5383 Other fatigue: Secondary | ICD-10-CM | POA: Diagnosis not present

## 2018-05-23 DIAGNOSIS — R0602 Shortness of breath: Secondary | ICD-10-CM | POA: Diagnosis not present

## 2018-05-23 DIAGNOSIS — I10 Essential (primary) hypertension: Secondary | ICD-10-CM | POA: Diagnosis not present

## 2018-05-23 DIAGNOSIS — R5381 Other malaise: Secondary | ICD-10-CM | POA: Diagnosis not present

## 2018-05-28 DIAGNOSIS — H6123 Impacted cerumen, bilateral: Secondary | ICD-10-CM | POA: Diagnosis not present

## 2018-06-05 DIAGNOSIS — Z961 Presence of intraocular lens: Secondary | ICD-10-CM | POA: Diagnosis not present

## 2018-06-05 DIAGNOSIS — H35033 Hypertensive retinopathy, bilateral: Secondary | ICD-10-CM | POA: Diagnosis not present

## 2018-06-22 DIAGNOSIS — M1712 Unilateral primary osteoarthritis, left knee: Secondary | ICD-10-CM | POA: Diagnosis not present

## 2018-06-22 DIAGNOSIS — M1711 Unilateral primary osteoarthritis, right knee: Secondary | ICD-10-CM | POA: Diagnosis not present

## 2018-06-28 DIAGNOSIS — Z96652 Presence of left artificial knee joint: Secondary | ICD-10-CM | POA: Diagnosis not present

## 2018-07-11 ENCOUNTER — Telehealth (INDEPENDENT_AMBULATORY_CARE_PROVIDER_SITE_OTHER): Payer: Self-pay | Admitting: Orthopedic Surgery

## 2018-07-11 NOTE — Telephone Encounter (Signed)
Pt called requesting an apt. Dr Sharol Given didn't have anything open till the 29th. Pt accepted the apt but was wondering if she could have anything for the pain  Pt # [phone]

## 2018-07-12 ENCOUNTER — Encounter (INDEPENDENT_AMBULATORY_CARE_PROVIDER_SITE_OTHER): Payer: Self-pay | Admitting: Orthopedic Surgery

## 2018-07-12 ENCOUNTER — Telehealth (INDEPENDENT_AMBULATORY_CARE_PROVIDER_SITE_OTHER): Payer: Self-pay | Admitting: Orthopedic Surgery

## 2018-07-12 ENCOUNTER — Ambulatory Visit (INDEPENDENT_AMBULATORY_CARE_PROVIDER_SITE_OTHER): Payer: Medicare Other | Admitting: Orthopedic Surgery

## 2018-07-12 ENCOUNTER — Ambulatory Visit (INDEPENDENT_AMBULATORY_CARE_PROVIDER_SITE_OTHER): Payer: Medicare Other

## 2018-07-12 VITALS — Ht 67.0 in | Wt 203.0 lb

## 2018-07-12 DIAGNOSIS — M545 Low back pain, unspecified: Secondary | ICD-10-CM

## 2018-07-12 DIAGNOSIS — M546 Pain in thoracic spine: Secondary | ICD-10-CM

## 2018-07-12 MED ORDER — METHOCARBAMOL 500 MG PO TABS: 500.0000 mg | ORAL_TABLET | Freq: Four times a day (QID) | ORAL | 0 refills | Status: DC | PRN

## 2018-07-12 MED ORDER — PREDNISONE 10 MG PO TABS: 20.0000 mg | ORAL_TABLET | Freq: Every day | ORAL | 0 refills | Status: DC

## 2018-07-12 NOTE — Telephone Encounter (Signed)
Do not feel the muscle relaxant would help with knee pain, will evaluate at follow up

## 2018-07-12 NOTE — Telephone Encounter (Signed)
Patient called asked if she can get a  Muscle relaxer called in to her pharmacy. Patient has an appointment 07/16/17. The number to contact Raquel Sarna is [phone]

## 2018-07-12 NOTE — Telephone Encounter (Signed)
Lets see her monday

## 2018-07-12 NOTE — Telephone Encounter (Signed)
I called patient and seemed like she was in distress, I advised her to come and today for her back pain.

## 2018-07-12 NOTE — Telephone Encounter (Signed)
Please advise, patient wants pain medication and she is not on database. Her upcoming appt is on 1/29. She is being seen for chronic knee pain and had last injection 10/19

## 2018-07-12 NOTE — Telephone Encounter (Signed)
Ok, Thank you! 

## 2018-07-12 NOTE — Telephone Encounter (Signed)
Please advise, I sent you another message for this same patient. She wants muscle relaxer sent to pharmacy.

## 2018-07-13 ENCOUNTER — Encounter (INDEPENDENT_AMBULATORY_CARE_PROVIDER_SITE_OTHER): Payer: Self-pay | Admitting: Orthopedic Surgery

## 2018-07-13 NOTE — Progress Notes (Signed)
Office Visit Note   Patient: [name]           Date of Birth: 17-Jan-1941           MRN: [id number] Visit Date: 07/12/2018              Requested by: Lucianne Lei, MD [address] [place], [place] [address] PCP: Lucianne Lei, MD  Chief Complaint  Patient presents with  . Spine - Pain    Thoracic spine pain       HPI: Patient is a 78 year old woman who states she has had midline low back pain since Sunday.  Denies any specific injuries denies any radicular symptoms.  Patient states that her back pain is so severe that she is tearful.  She states she is tried a heating pad without relief.  Assessment & Plan: Visit Diagnoses:  1. Pain in thoracic spine   2. Acute midline low back pain without sciatica     Plan: Prescription called in for prednisone and Robaxin.  Patient will wean off the prednisone as her symptoms improved.  If she is not showing any improvement we will need to consider an MRI scan.  Follow-Up Instructions: Return in about 4 weeks (around 08/09/2018).   Ortho Exam  Patient is alert, oriented, no adenopathy, well-dressed, normal affect, normal respiratory effort. Examination patient uses a cane for ambulation.  She has a negative straight leg raise bilaterally no focal motor weakness in either lower extremity no pain with range of motion of her hips knees or ankles.  She is tender to palpation over the thoracic lumbar area.  No fractures per radiograph.  Imaging: No results found. No images are attached to the encounter.  Labs: No results found for: HGBA1C, ESRSEDRATE, CRP, LABURIC, REPTSTATUS, GRAMSTAIN, CULT, LABORGA   No results found for: ALBUMIN, PREALBUMIN, LABURIC  Body mass index is 31.79 kg/m.  Orders:  Orders Placed This Encounter  Procedures  . XR Thoracic Spine 2 View   Meds ordered this encounter  Medications  . methocarbamol (ROBAXIN) 500 MG tablet    Sig: Take 1 tablet (500 mg total) by mouth every 6 (six) hours as  needed for muscle spasms.    Dispense:  30 tablet    Refill:  0  . predniSONE (DELTASONE) 10 MG tablet    Sig: Take 2 tablets (20 mg total) by mouth daily with breakfast.    Dispense:  60 tablet    Refill:  0     Procedures: No procedures performed  Clinical Data: No additional findings.  ROS:  All other systems negative, except as noted in the HPI. Review of Systems  Objective: Vital Signs: Ht 5\' 7"  (1.702 m)   Wt 203 lb (92.1 kg)   BMI 31.79 kg/m   Specialty Comments:  No specialty comments available.  PMFS History: Patient Active Problem List   Diagnosis Date Noted  . Unilateral primary osteoarthritis, right knee 11/21/2017  . Status post revision of total knee, left 11/21/2017  . Chronic rhinitis 02/25/2016  . Cough 02/25/2016  . Hoarseness of voice 02/25/2016  . Chronic leukopenia 12/15/2015   Past Medical History:  Diagnosis Date  . Asthma    allergic induced  . Cough   . Hypertension   . Rhinitis     Family History  Problem Relation Age of Onset  . Other Mother        died when patient was 59 months old  . Hypertension Father   .  Cerebral aneurysm Sister   . Breast cancer Sister   . Lung cancer Sister   . Healthy Son   . Allergic rhinitis Neg Hx   . Angioedema Neg Hx   . Asthma Neg Hx   . Eczema Neg Hx   . Immunodeficiency Neg Hx   . Urticaria Neg Hx     Past Surgical History:  Procedure Laterality Date  . ABDOMINAL HYSTERECTOMY    . BACK SURGERY    . CATARACT EXTRACTION    . MASS EXCISION  01/05/2012   Procedure: MINOR EXCISION OF MASS;  Surgeon: Cammie Sickle., MD;  Location: [facility];  Service: Orthopedics;  Laterality: Right;  excision mucoid cyst right long finger, debride DIP joint  . ROTATOR CUFF REPAIR Bilateral   . TOTAL KNEE ARTHROPLASTY Left    twice   Social History   Occupational History  . Occupation: retired    Comment: Warehouse manager  Tobacco Use  . Smoking status: Never Smoker  . Smokeless  tobacco: Never Used  Substance and Sexual Activity  . Alcohol use: No    Alcohol/week: 0.0 standard drinks  . Drug use: No  . Sexual activity: Not on file    Comment: retired from [employer]. 1 son.

## 2018-07-16 ENCOUNTER — Ambulatory Visit (INDEPENDENT_AMBULATORY_CARE_PROVIDER_SITE_OTHER): Payer: Medicare Other | Admitting: Physician Assistant

## 2018-07-16 ENCOUNTER — Encounter (INDEPENDENT_AMBULATORY_CARE_PROVIDER_SITE_OTHER): Payer: Self-pay | Admitting: Physician Assistant

## 2018-07-16 VITALS — Ht 65.5 in | Wt 201.0 lb

## 2018-07-16 DIAGNOSIS — G8929 Other chronic pain: Secondary | ICD-10-CM | POA: Diagnosis not present

## 2018-07-16 DIAGNOSIS — M545 Low back pain: Secondary | ICD-10-CM

## 2018-07-16 NOTE — Progress Notes (Signed)
Office Visit Note   Patient: [name]           Date of Birth: 1941-03-20           MRN: [id number] Visit Date: 07/16/2018              Requested by: Lucianne Lei, MD [address] [place], [place] [address] PCP: Lucianne Lei, MD  Chief Complaint  Patient presents with  . Middle Back - Follow-up      HPI: Patient is a 78 year old gentleman with chronic pain in her lower back at the thoracic lumbar spine region.  She states the prednisone has improved her symptoms but she states she is still quite painful with movement.  She denies any radicular pain in her upper or lower extremities she states her pain is worse with sitting.  Assessment & Plan: Visit Diagnoses:  1. Chronic midline low back pain without sciatica     Plan: Due to the persistent pain we will request an MRI scan to evaluate the thoracic lumbar spine region.  She will continue with the prednisone 20 mg in the morning.  Follow-Up Instructions: Return in about 2 weeks (around 07/30/2018).   Ortho Exam  Patient is alert, oriented, no adenopathy, well-dressed, normal affect, normal respiratory effort. Examination patient ambulates with a cane.  She has no focal motor weakness in either upper extremity or lower extremity she has a negative sciatic stretch test for both lower extremities.  No pain with range of motion of the hip knee or ankle bilaterally.  She is tender to palpation in the region of the thoracic lumbar spine.  Imaging: No results found. No images are attached to the encounter.  Labs: No results found for: HGBA1C, ESRSEDRATE, CRP, LABURIC, REPTSTATUS, GRAMSTAIN, CULT, LABORGA   No results found for: ALBUMIN, PREALBUMIN, LABURIC  Body mass index is 32.94 kg/m.  Orders:  Orders Placed This Encounter  Procedures  . MR Lumbar Spine w/o contrast   No orders of the defined types were placed in this encounter.    Procedures: No procedures performed  Clinical Data: No additional  findings.  ROS:  All other systems negative, except as noted in the HPI. Review of Systems  Objective: Vital Signs: Ht 5' 5.5" (1.664 m)   Wt 201 lb (91.2 kg)   BMI 32.94 kg/m   Specialty Comments:  No specialty comments available.  PMFS History: Patient Active Problem List   Diagnosis Date Noted  . Unilateral primary osteoarthritis, right knee 11/21/2017  . Status post revision of total knee, left 11/21/2017  . Chronic rhinitis 02/25/2016  . Cough 02/25/2016  . Hoarseness of voice 02/25/2016  . Chronic leukopenia 12/15/2015   Past Medical History:  Diagnosis Date  . Asthma    allergic induced  . Cough   . Hypertension   . Rhinitis     Family History  Problem Relation Age of Onset  . Other Mother        died when patient was 58 months old  . Hypertension Father   . Cerebral aneurysm Sister   . Breast cancer Sister   . Lung cancer Sister   . Healthy Son   . Allergic rhinitis Neg Hx   . Angioedema Neg Hx   . Asthma Neg Hx   . Eczema Neg Hx   . Immunodeficiency Neg Hx   . Urticaria Neg Hx     Past Surgical History:  Procedure Laterality Date  . ABDOMINAL HYSTERECTOMY    .  BACK SURGERY    . CATARACT EXTRACTION    . MASS EXCISION  01/05/2012   Procedure: MINOR EXCISION OF MASS;  Surgeon: Cammie Sickle., MD;  Location: [facility];  Service: Orthopedics;  Laterality: Right;  excision mucoid cyst right long finger, debride DIP joint  . ROTATOR CUFF REPAIR Bilateral   . TOTAL KNEE ARTHROPLASTY Left    twice   Social History   Occupational History  . Occupation: retired    Comment: Warehouse manager  Tobacco Use  . Smoking status: Never Smoker  . Smokeless tobacco: Never Used  Substance and Sexual Activity  . Alcohol use: No    Alcohol/week: 0.0 standard drinks  . Drug use: No  . Sexual activity: Not on file    Comment: retired from [employer]. 1 son.

## 2018-07-18 ENCOUNTER — Ambulatory Visit (INDEPENDENT_AMBULATORY_CARE_PROVIDER_SITE_OTHER): Payer: Medicare Other | Admitting: Orthopedic Surgery

## 2018-07-18 ENCOUNTER — Other Ambulatory Visit (INDEPENDENT_AMBULATORY_CARE_PROVIDER_SITE_OTHER): Payer: Self-pay | Admitting: Orthopedic Surgery

## 2018-07-19 NOTE — Telephone Encounter (Signed)
Do you want to refill? 

## 2018-07-21 ENCOUNTER — Ambulatory Visit
Admission: RE | Admit: 2018-07-21 | Discharge: 2018-07-21 | Disposition: A | Discharge status: Home / Self Care | Payer: Medicare Other | Source: Ambulatory Visit | Attending: Orthopedic Surgery | Admitting: Orthopedic Surgery

## 2018-07-21 DIAGNOSIS — M48061 Spinal stenosis, lumbar region without neurogenic claudication: Secondary | ICD-10-CM | POA: Diagnosis not present

## 2018-07-21 DIAGNOSIS — M5127 Other intervertebral disc displacement, lumbosacral region: Secondary | ICD-10-CM | POA: Diagnosis not present

## 2018-07-21 DIAGNOSIS — G8929 Other chronic pain: Secondary | ICD-10-CM

## 2018-07-21 DIAGNOSIS — M5126 Other intervertebral disc displacement, lumbar region: Secondary | ICD-10-CM | POA: Diagnosis not present

## 2018-07-21 DIAGNOSIS — M545 Low back pain, unspecified: Secondary | ICD-10-CM

## 2018-07-21 DIAGNOSIS — M47816 Spondylosis without myelopathy or radiculopathy, lumbar region: Secondary | ICD-10-CM | POA: Diagnosis not present

## 2018-07-24 ENCOUNTER — Other Ambulatory Visit (INDEPENDENT_AMBULATORY_CARE_PROVIDER_SITE_OTHER): Payer: Self-pay

## 2018-07-24 DIAGNOSIS — M545 Low back pain: Principal | ICD-10-CM

## 2018-07-24 DIAGNOSIS — G8929 Other chronic pain: Secondary | ICD-10-CM

## 2018-07-26 DIAGNOSIS — E119 Type 2 diabetes mellitus without complications: Secondary | ICD-10-CM | POA: Diagnosis not present

## 2018-07-26 DIAGNOSIS — R5383 Other fatigue: Secondary | ICD-10-CM | POA: Diagnosis not present

## 2018-07-26 DIAGNOSIS — I10 Essential (primary) hypertension: Secondary | ICD-10-CM | POA: Diagnosis not present

## 2018-07-26 DIAGNOSIS — R251 Tremor, unspecified: Secondary | ICD-10-CM | POA: Diagnosis not present

## 2018-07-26 DIAGNOSIS — E785 Hyperlipidemia, unspecified: Secondary | ICD-10-CM | POA: Diagnosis not present

## 2018-07-27 DIAGNOSIS — M25562 Pain in left knee: Secondary | ICD-10-CM | POA: Diagnosis not present

## 2018-08-01 DIAGNOSIS — M25561 Pain in right knee: Secondary | ICD-10-CM | POA: Diagnosis not present

## 2018-08-01 DIAGNOSIS — G894 Chronic pain syndrome: Secondary | ICD-10-CM | POA: Diagnosis not present

## 2018-08-01 DIAGNOSIS — M25562 Pain in left knee: Secondary | ICD-10-CM | POA: Diagnosis not present

## 2018-08-01 DIAGNOSIS — M545 Low back pain: Secondary | ICD-10-CM | POA: Diagnosis not present

## 2018-08-06 ENCOUNTER — Ambulatory Visit (INDEPENDENT_AMBULATORY_CARE_PROVIDER_SITE_OTHER): Payer: Medicare Other | Admitting: Orthopedic Surgery

## 2018-08-13 ENCOUNTER — Ambulatory Visit (INDEPENDENT_AMBULATORY_CARE_PROVIDER_SITE_OTHER): Payer: Medicare Other

## 2018-08-13 ENCOUNTER — Ambulatory Visit (INDEPENDENT_AMBULATORY_CARE_PROVIDER_SITE_OTHER): Payer: Medicare Other | Admitting: Physical Medicine and Rehabilitation

## 2018-08-13 ENCOUNTER — Encounter (INDEPENDENT_AMBULATORY_CARE_PROVIDER_SITE_OTHER): Payer: Self-pay | Admitting: Physical Medicine and Rehabilitation

## 2018-08-13 VITALS — BP 152/84 | HR 79 | Temp 98.0°F

## 2018-08-13 DIAGNOSIS — M48062 Spinal stenosis, lumbar region with neurogenic claudication: Secondary | ICD-10-CM | POA: Diagnosis not present

## 2018-08-13 DIAGNOSIS — M5416 Radiculopathy, lumbar region: Secondary | ICD-10-CM | POA: Diagnosis not present

## 2018-08-13 MED ORDER — DEXAMETHASONE SODIUM PHOSPHATE 10 MG/ML IJ SOLN
15.0000 mg | Freq: Once | INTRAMUSCULAR | Status: AC
  Administered 2018-08-13: 15 mg

## 2018-08-13 NOTE — Progress Notes (Signed)
   Numeric Pain Rating Scale and Functional Assessment Average Pain 10   In the last MONTH (on 0-10 scale) has pain interfered with the following?  1. General activity like being  able to carry out your everyday physical activities such as walking, climbing stairs, carrying groceries, or moving a chair?  Rating(8)   +Driver, -BT, -Dye Allergies.  

## 2018-08-14 DIAGNOSIS — M25562 Pain in left knee: Secondary | ICD-10-CM | POA: Diagnosis not present

## 2018-08-14 DIAGNOSIS — Z471 Aftercare following joint replacement surgery: Secondary | ICD-10-CM | POA: Diagnosis not present

## 2018-08-14 DIAGNOSIS — G8929 Other chronic pain: Secondary | ICD-10-CM | POA: Diagnosis not present

## 2018-08-14 DIAGNOSIS — M25461 Effusion, right knee: Secondary | ICD-10-CM | POA: Diagnosis not present

## 2018-08-14 DIAGNOSIS — Z96652 Presence of left artificial knee joint: Secondary | ICD-10-CM | POA: Diagnosis not present

## 2018-08-14 DIAGNOSIS — M25462 Effusion, left knee: Secondary | ICD-10-CM | POA: Diagnosis not present

## 2018-08-14 DIAGNOSIS — M1712 Unilateral primary osteoarthritis, left knee: Secondary | ICD-10-CM | POA: Diagnosis not present

## 2018-08-14 DIAGNOSIS — M1711 Unilateral primary osteoarthritis, right knee: Secondary | ICD-10-CM | POA: Diagnosis not present

## 2018-08-17 NOTE — Procedures (Signed)
Lumbosacral Transforaminal Epidural Steroid Injection - Sub-Pedicular Approach with Fluoroscopic Guidance  Patient: [name]      Date of Birth: 1940/12/05 MRN: [id number] PCP: Lucianne Lei, MD      Visit Date: 08/13/2018   Universal Protocol:    Date/Time: 08/13/2018  Consent Given By: the patient  Position: PRONE  Additional Comments: Vital signs were monitored before and after the procedure. Patient was prepped and draped in the usual sterile fashion. The correct patient, procedure, and site was verified.   Injection Procedure Details:  Procedure Site One Meds Administered:  Meds ordered this encounter  Medications  . dexamethasone (DECADRON) injection 15 mg    Laterality: Bilateral  Location/Site:  L2-L3  Needle size: 22 G  Needle type: Spinal  Needle Placement: Transforaminal  Findings:    -Comments: Excellent flow of contrast along the nerve and into the epidural space.  Procedure Details: After squaring off the end-plates to get a true AP view, the C-arm was positioned so that an oblique view of the foramen as noted above was visualized. The target area is just inferior to the "nose of the scotty dog" or sub pedicular. The soft tissues overlying this structure were infiltrated with 2-3 ml. of 1% Lidocaine without Epinephrine.  The spinal needle was inserted toward the target using a "trajectory" view along the fluoroscope beam.  Under AP and lateral visualization, the needle was advanced so it did not puncture dura and was located close the 6 O'Clock position of the pedical in AP tracterory. Biplanar projections were used to confirm position. Aspiration was confirmed to be negative for CSF and/or blood. A 1-2 ml. volume of Isovue-250 was injected and flow of contrast was noted at each level. Radiographs were obtained for documentation purposes.   After attaining the desired flow of contrast documented above, a 0.5 to 1.0 ml test dose of 0.25% Marcaine was  injected into each respective transforaminal space.  The patient was observed for 90 seconds post injection.  After no sensory deficits were reported, and normal lower extremity motor function was noted,   the above injectate was administered so that equal amounts of the injectate were placed at each foramen (level) into the transforaminal epidural space.   Additional Comments:  The patient tolerated the procedure well Dressing: 2 x 2 sterile gauze and Band-Aid    Post-procedure details: Patient was observed during the procedure. Post-procedure instructions were reviewed.  Patient left the clinic in stable condition.

## 2018-08-17 NOTE — Progress Notes (Signed)
[name] - 78 y.o. female MRN [id number]  Date of birth: [birth year]  Office Visit Note: Visit Date: 08/13/2018 PCP: Lucianne Lei, MD Referred by: Lucianne Lei, MD  Subjective: Chief Complaint  Patient presents with  . Middle Back - Pain  . Lower Back - Pain   HPI: [name] is a 78 y.o. female who comes in today For planned bilateral L2 transforaminal epidural steroid injection.  She is followed by Dr. Meridee Score for orthopedic complaints and began having significant back pain referring into the upper hips that started mid January of this year.  There was no specific trauma.  Patient was having such pain that she was very tearful and her pain is 10 out of 10 still today.  She was treated conservatively with medication management including prednisone which helped a little bit.  MRI of the lumbar spine was performed and this is reviewed with the patient today with spine models and imaging and is reviewed below.  Basic finding is at L2-3 there is no severe central stenosis multifactorial.  This is in relation to prior imaging from 2006.  In 2006 she did have a series of epidural injections at [facility] imaging.  She has multilevel facet arthritis which could also be a source of pain.  The L2-3 region is the only 1 that has the stenosis however.  Depending on relief would look at facet joint blocks.  She also reports really all over back pain even pain up into the thoracic region.  Imaging did not show any worrisome signs of infection etc.  She actually reports pain if she sits for too long but also with moving around around and standing.  In general she is having a lot of pain complaints.  She has had no focal weakness or bowel or bladder changes or fevers chills or night sweats or unintended weight loss.  She has had a prior left total knee replacement.  Review of Systems  Constitutional: Negative for chills, fever, malaise/fatigue and weight loss.  HENT: Negative for hearing loss  and sinus pain.   Eyes: Negative for blurred vision, double vision and photophobia.  Respiratory: Negative for cough and shortness of breath.   Cardiovascular: Negative for chest pain, palpitations and leg swelling.  Gastrointestinal: Negative for abdominal pain, nausea and vomiting.  Genitourinary: Negative for flank pain.  Musculoskeletal: Positive for back pain. Negative for myalgias.  Skin: Negative for itching and rash.  Neurological: Negative for tremors, focal weakness and weakness.  Endo/Heme/Allergies: Negative.   Psychiatric/Behavioral: Negative for depression.  All other systems reviewed and are negative.  Otherwise per HPI.  Assessment & Plan: Visit Diagnoses:  1. Lumbar radiculopathy   2. Spinal stenosis of lumbar region with neurogenic claudication     Plan: Findings:  Chronic history of back pain was severe episode in 2006 and she did have prior lumbar surgery laminectomy discectomy at this level after having unsuccessful epidural injections performed at [place].  She has been doing okay from a back pain standpoint where she have off and on back pain but nothing severe.  She has had chronic pain since the middle of January and now with significant worsening pain that is 10 out of 10 pain.  MRI does show severe stenosis at L2-3 with some endplate edema.  This is the likely source of pain though she is not having any radicular complaints.  Depending on relief would look at facet joint block.  Would do well with regrouping with physical  therapy for modalities and manual treatment.  Should continue on current medications which is clearly a muscle relaxer and some prednisone which she can finish.  We are going to complete bilateral L2 transforaminal injection today to the severity of her symptoms.  Exam is really nonfocal.    Meds & Orders:  Meds ordered this encounter  Medications  . dexamethasone (DECADRON) injection 15 mg    Orders Placed This Encounter    Procedures  . XR C-ARM NO REPORT  . Epidural Steroid injection    Follow-up: Return if symptoms worsen or fail to improve.   Procedures: No procedures performed  Lumbosacral Transforaminal Epidural Steroid Injection - Sub-Pedicular Approach with Fluoroscopic Guidance  Patient: [name]      Date of Birth: [birth year] MRN: [id number] PCP: Lucianne Lei, MD      Visit Date: 08/13/2018   Universal Protocol:    Date/Time: 08/13/2018  Consent Given By: the patient  Position: PRONE  Additional Comments: Vital signs were monitored before and after the procedure. Patient was prepped and draped in the usual sterile fashion. The correct patient, procedure, and site was verified.   Injection Procedure Details:  Procedure Site One Meds Administered:  Meds ordered this encounter  Medications  . dexamethasone (DECADRON) injection 15 mg    Laterality: Bilateral  Location/Site:  L2-L3  Needle size: 22 G  Needle type: Spinal  Needle Placement: Transforaminal  Findings:    -Comments: Excellent flow of contrast along the nerve and into the epidural space.  Procedure Details: After squaring off the end-plates to get a true AP view, the C-arm was positioned so that an oblique view of the foramen as noted above was visualized. The target area is just inferior to the "nose of the scotty dog" or sub pedicular. The soft tissues overlying this structure were infiltrated with 2-3 ml. of 1% Lidocaine without Epinephrine.  The spinal needle was inserted toward the target using a "trajectory" view along the fluoroscope beam.  Under AP and lateral visualization, the needle was advanced so it did not puncture dura and was located close the 6 O'Clock position of the pedical in AP tracterory. Biplanar projections were used to confirm position. Aspiration was confirmed to be negative for CSF and/or blood. A 1-2 ml. volume of Isovue-250 was injected and flow of contrast was noted at each  level. Radiographs were obtained for documentation purposes.   After attaining the desired flow of contrast documented above, a 0.5 to 1.0 ml test dose of 0.25% Marcaine was injected into each respective transforaminal space.  The patient was observed for 90 seconds post injection.  After no sensory deficits were reported, and normal lower extremity motor function was noted,   the above injectate was administered so that equal amounts of the injectate were placed at each foramen (level) into the transforaminal epidural space.   Additional Comments:  The patient tolerated the procedure well Dressing: 2 x 2 sterile gauze and Band-Aid    Post-procedure details: Patient was observed during the procedure. Post-procedure instructions were reviewed.  Patient left the clinic in stable condition.     Clinical History: MRI LUMBAR SPINE WITHOUT CONTRAST  TECHNIQUE: Multiplanar, multisequence MR imaging of the lumbar spine was performed. No intravenous contrast was administered.  COMPARISON:  CT lumbar spine dated May 06, 2005. MRI lumbar spine dated February 28, 2005.  FINDINGS: Segmentation:  Standard.  Alignment:  Physiologic.  Vertebrae: No fracture, evidence of discitis, or bone lesion. Mild right-sided degenerative endplate marrow  edema at L2-L3. Chronic fatty degenerative endplate marrow changes at L3-L4 and L4-L5.  Conus medullaris and cauda equina: Conus extends to the L1 level. Conus and cauda equina appear normal.  Paraspinal and other soft tissues: Susceptibility artifact in the midline lower back over the L4-L5 level. Otherwise negative.  Disc levels:  T12-L1: No significant disc bulge or herniation. No spinal canal or neuroforaminal stenosis.  L1-L2: Progressive disc bulging with new mild bilateral lateral recess stenosis. New mild right neuroforaminal stenosis. No spinal canal or left neuroforaminal stenosis.  L2-L3: Progressive disc bulging,  bilateral facet arthropathy, and prominent ligamentum flavum hypertrophy. New severe central spinal canal and lateral recess stenosis. New mild right neuroforaminal stenosis.  L3-L4: Mild disc bulging and bilateral facet arthropathy with ligamentum flavum hypertrophy. Small superimposed right subarticular and foraminal disc protrusion. Moderate right and mild left lateral recess stenosis. Mild spinal canal stenosis. Mild to moderate right neuroforaminal stenosis. No left neuroforaminal stenosis. Findings are similar to prior study.  L4-L5: Prior left hemilaminectomy. Progressive disc height loss asymmetric to the left with diffuse disc bulging and left-sided disc osteophyte complex. Unchanged moderate bilateral facet arthropathy and ligamentum flavum hypertrophy. Unchanged mild to moderate spinal canal and bilateral lateral recess stenosis. Unchanged moderate left and new mild right neuroforaminal stenosis.  L5-S1: Prior left hemilaminectomy. Broad-based posterior disc protrusion slightly eccentric to the left. Moderate bilateral facet arthropathy. No spinal canal stenosis. Moderate left and mild right neuroforaminal stenosis. Findings are similar to prior study.  IMPRESSION: 1. Multilevel lumbar spondylosis as described above, progressed at several levels, most prominently at L2-L3 where there is new severe central spinal canal stenosis. 2. Unchanged mild L3-L4 and mild-to-moderate L4-L5 spinal canal stenosis. 3. Unchanged moderate left neuroforaminal stenosis at L4-L5 and L5-S1.   Electronically Signed   By: Titus Dubin M.D.   On: 07/21/2018 09:42   She reports that she has never smoked. She has never used smokeless tobacco. No results for input(s): HGBA1C, LABURIC in the last 8760 hours.  Objective:  VS:  HT:    WT:   BMI:     BP:(!) 152/84  HR:79bpm  TEMP:98 F (36.7 C)(Oral)  RESP:  Physical Exam Vitals signs and nursing note reviewed.  Constitutional:       General: She is not in acute distress.    Appearance: Normal appearance. She is well-developed. She is obese. She is not ill-appearing.  HENT:     Head: Normocephalic and atraumatic.  Eyes:     Conjunctiva/sclera: Conjunctivae normal.     Pupils: Pupils are equal, round, and reactive to light.  Cardiovascular:     Rate and Rhythm: Normal rate.     Pulses: Normal pulses.  Pulmonary:     Effort: Pulmonary effort is normal.  Musculoskeletal:     Right lower leg: No edema.     Left lower leg: No edema.     Comments: Patient stands with forward flexed lumbar spine and does have pain with extension and facet loading.  She has no focal trigger points noted.  She has no pain with hip rotation she has good distal strength.  Skin:    General: Skin is warm and dry.     Findings: No erythema or rash.  Neurological:     General: No focal deficit present.     Mental Status: She is alert and oriented to person, place, and time.     Sensory: No sensory deficit.     Motor: No abnormal muscle tone.  Coordination: Coordination normal.     Gait: Gait normal.  Psychiatric:        Mood and Affect: Mood normal.        Behavior: Behavior normal.     Ortho Exam Imaging: No results found.  Past Medical/Family/Surgical/Social History: Medications & Allergies reviewed per EMR, new medications updated. Patient Active Problem List   Diagnosis Date Noted  . Unilateral primary osteoarthritis, right knee 11/21/2017  . Status post revision of total knee, left 11/21/2017  . Chronic rhinitis 02/25/2016  . Cough 02/25/2016  . Hoarseness of voice 02/25/2016  . Chronic leukopenia 12/15/2015   Past Medical History:  Diagnosis Date  . Asthma    allergic induced  . Cough   . Hypertension   . Rhinitis    Family History  Problem Relation Age of Onset  . Other Mother        died when patient was 59 months old  . Hypertension Father   . Cerebral aneurysm Sister   . Breast cancer Sister   .  Lung cancer Sister   . Healthy Son   . Allergic rhinitis Neg Hx   . Angioedema Neg Hx   . Asthma Neg Hx   . Eczema Neg Hx   . Immunodeficiency Neg Hx   . Urticaria Neg Hx    Past Surgical History:  Procedure Laterality Date  . ABDOMINAL HYSTERECTOMY    . BACK SURGERY    . CATARACT EXTRACTION    . MASS EXCISION  01/05/2012   Procedure: MINOR EXCISION OF MASS;  Surgeon: Cammie Sickle., MD;  Location: [place];  Service: Orthopedics;  Laterality: Right;  excision mucoid cyst right long finger, debride DIP joint  . ROTATOR CUFF REPAIR Bilateral   . TOTAL KNEE ARTHROPLASTY Left    twice   Social History   Occupational History  . Occupation: retired    Comment: Warehouse manager  Tobacco Use  . Smoking status: Never Smoker  . Smokeless tobacco: Never Used  Substance and Sexual Activity  . Alcohol use: No    Alcohol/week: 0.0 standard drinks  . Drug use: No  . Sexual activity: Not on file    Comment: retired from [employer]. 1 son.

## 2018-08-21 NOTE — Progress Notes (Signed)
[name] was seen today in the movement disorders clinic for neurologic consultation at the request of Lucianne Lei, MD.  The consultation is for the evaluation of tremor for 6 months.  The records that were made available to me were reviewed.   States that she saw Dr. Mee Hives at [facility].   She states that she was told she had "silent tremor."  She was given primidone but she didn't take it because she wanted a 2nd opinion.  03/16/18 update: Patient is seen today in follow-up for essential tremor.  She was started on primidone last visit.  She reports today that she is only on a 1/2 tablet at night.  It helped but not enough.  "I was worried about taking more."  Records are reviewed since last visit.  She has seen several specialists including orthopedics, dermatology and allergy.  08/23/18 update: Patient seen today in follow-up for essential tremor.  Last visit, I told her to increase her primidone to 50 mg nightly.  Reports that her tremor is about the same.  Records are reviewed since last visit.  Has been seeing orthopedics regarding lumbar radiculopathy.  Her back is doing much better but she still has some pain.    PREVIOUS MEDICATIONS: none to date  ALLERGIES:   Allergies  Allergen Reactions  . Latex Shortness Of Breath    CURRENT MEDICATIONS:  Outpatient Encounter Medications as of 08/23/2018  Medication Sig  . Ascorbic Acid (VITAMIN C) 250 MG CHEW Chew 250 mg by mouth.  Marland Kitchen azelastine (ASTELIN) 0.1 % nasal spray Place 2 sprays into both nostrils 2 (two) times daily.  . beclomethasone (QVAR) 80 MCG/ACT inhaler Inhale into the lungs.  . betamethasone dipropionate (DIPROLENE) 0.05 % ointment   . budesonide-formoterol (SYMBICORT) 80-4.5 MCG/ACT inhaler Inhale 2 puffs into the lungs 2 (two) times daily.  Marland Kitchen CALCIUM-MAGNESIUM-ZINC PO Take by mouth.  . Carbinoxamine Maleate (RYVENT) 6 MG TABS Take 1 tablet by mouth every 8 (eight) hours.  . cholecalciferol (VITAMIN D3) 25  MCG (1000 UT) tablet Take 1,000 Units by mouth daily.  . clindamycin (CLEOCIN-T) 1 % lotion   . clobetasol cream (TEMOVATE) 0.05 %   . Colesevelam HCl (WELCHOL) 3.75 g PACK   . Diclofenac Sodium (PENNSAID) 1.5 % SOLN Place onto the skin. Reported on 08/26/2015  . docusate sodium (COLACE) 100 MG capsule Take 100 mg by mouth daily as needed for mild constipation.  . montelukast (SINGULAIR) 10 MG tablet Take 1 tablet (10 mg total) by mouth at bedtime.  . multivitamin-iron-minerals-folic acid (CENTRUM) chewable tablet Chew 1 tablet by mouth daily.  . niacin (NIASPAN) 500 MG CR tablet Take 500 mg by mouth at bedtime.  Marland Kitchen olmesartan-hydrochlorothiazide (BENICAR HCT) 20-12.5 MG per tablet Take 1 tablet by mouth daily.  . Pitavastatin Calcium (LIVALO) 4 MG TABS Take by mouth.  . primidone (MYSOLINE) 50 MG tablet Take 1 tablet (50 mg total) by mouth at bedtime.  . [DISCONTINUED] Beclomethasone Dipropionate (QNASL) 80 MCG/ACT AERS 1-2 actuation twice daily  . [DISCONTINUED] Calcium-Magnesium (CALCIUM MAGNESIUM 750) 300-300 MG TABS Take by mouth.  . [DISCONTINUED] levalbuterol (XOPENEX) 1.25 MG/3ML nebulizer solution USE 1 VIAL VIA NEBULIZER EVERY 4 HOURS AS NEEDED FOR WHEEZING  . [DISCONTINUED] methocarbamol (ROBAXIN) 500 MG tablet TAKE 1 TABLET(500 MG) BY MOUTH EVERY 6 HOURS AS NEEDED FOR MUSCLE SPASMS  . [DISCONTINUED] montelukast (SINGULAIR) 10 MG tablet TAKE 1 TABLET BY MOUTH AT BEDTIME.  . [DISCONTINUED] predniSONE (DELTASONE) 10 MG tablet TAKE 2  TABLETS(20 MG) BY MOUTH DAILY WITH BREAKFAST   No facility-administered encounter medications on file as of 08/23/2018.     PAST MEDICAL HISTORY:   Past Medical History:  Diagnosis Date  . Asthma    allergic induced  . Cough   . Hypertension   . Rhinitis     PAST SURGICAL HISTORY:   Past Surgical History:  Procedure Laterality Date  . ABDOMINAL HYSTERECTOMY    . BACK SURGERY    . CATARACT EXTRACTION    . MASS EXCISION  01/05/2012   Procedure:  MINOR EXCISION OF MASS;  Surgeon: Cammie Sickle., MD;  Location: [place];  Service: Orthopedics;  Laterality: Right;  excision mucoid cyst right long finger, debride DIP joint  . ROTATOR CUFF REPAIR Bilateral   . TOTAL KNEE ARTHROPLASTY Left    twice    SOCIAL HISTORY:   Social History   Socioeconomic History  . Marital status: Divorced    Spouse name: Not on file  . Number of children: Not on file  . Years of education: Not on file  . Highest education level: Not on file  Occupational History  . Occupation: retired    Comment: Warehouse manager  Social Needs  . Financial resource strain: Not on file  . Food insecurity:    Worry: Not on file    Inability: Not on file  . Transportation needs:    Medical: Not on file    Non-medical: Not on file  Tobacco Use  . Smoking status: Never Smoker  . Smokeless tobacco: Never Used  Substance and Sexual Activity  . Alcohol use: No    Alcohol/week: 0.0 standard drinks  . Drug use: No  . Sexual activity: Not on file    Comment: retired from [employer]. 1 son.  Lifestyle  . Physical activity:    Days per week: Not on file    Minutes per session: Not on file  . Stress: Not on file  Relationships  . Social connections:    Talks on phone: Not on file    Gets together: Not on file    Attends religious service: Not on file    Active member of club or organization: Not on file    Attends meetings of clubs or organizations: Not on file    Relationship status: Not on file  . Intimate partner violence:    Fear of current or ex partner: Not on file    Emotionally abused: Not on file    Physically abused: Not on file    Forced sexual activity: Not on file  Other Topics Concern  . Not on file  Social History Narrative  . Not on file    FAMILY HISTORY:   Family Status  Relation Name Status  . Mother  Deceased  . Father  Deceased  . Sister 2 Alive  . Sister  Deceased  . Son  Alive  . Neg Hx  (Not  Specified)    ROS:  A complete 10 system review of systems was obtained and was unremarkable apart from what is mentioned above.  PHYSICAL EXAMINATION:    VITALS:   Vitals:   08/23/18 1444  BP: 110/70  Pulse: 78  SpO2: 96%  Weight: 208 lb (94.3 kg)  Height: 5' 5.5" (1.664 m)    GEN:  The patient appears stated age and is in NAD. HEENT:  Normocephalic, atraumatic.  The mucous membranes are moist. The superficial temporal arteries are without ropiness  or tenderness. CV:  RRR Lungs:  CTAB Neck/HEME:  There are no carotid bruits bilaterally.  Neurological examination:  Orientation: The patient is alert and oriented x3. Cranial nerves: There is good facial symmetry. The speech is fluent and clear. Soft palate rises symmetrically and there is no tongue deviation. Hearing is intact to conversational tone. Sensation: Sensation is intact to light touch throughout Motor: Strength is 5/5 in the bilateral upper and lower extremities.   Shoulder shrug is equal and symmetric.  There is no pronator drift.  Movement examination: Tone: There is normal tone in the upper and lower extremities. Abnormal movements: There is no rest tremor.  There is mild postural tremor of the L hand only.    She has evidence of mild tremor when doing Archimedes spirals on the left only and has mostly trouble getting the L hand to the paper.  Once she does that, the drawings aren't that bad.  There is slightly improved from last visit. Coordination:  There is no decremation with RAM's, with any form of RAMS, including alternating supination and pronation of the forearm, hand opening and closing, finger taps, heel taps and toe taps. Gait and Station: The patient has an antalgic gait and does not bend the left knee with ambulation (same as previous)   Labs: Lab work was reviewed.  It is dated August 14, 2017.  Sodium is 141, potassium 4.1, chloride 104, CO2 30, BUN 9, creatinine 0.84.  Hemoglobin A1c was 5.8.  White  blood cells were 2.2 (Dr. Alvy Bimler for leukopenia) hemoglobin 14.8, hematocrit 43.6 and platelets 177.  Lab Results  Component Value Date   TSH 1.79 11/10/2017      ASSESSMENT/PLAN:  1.    Essential Tremor.  -We discussed nature and pathophysiology.  We discussed that this can continue to gradually get worse with time.  We discussed that some medications can worsen this, as can caffeine use.  We discussed medication therapy as well as surgical therapy.  She was shown HIPAA compliant videos of patients who have had surgery.  We discussed that her tremor is asymmetric, which occurs in about 30% of cases.  Increase primidone to 50 mg bid.   She has been worried because she read about side effects in the package insert, including leukopenia and she has baseline leukopenia.  She was assured that this is a low dose and likely will not affect her counts.  I did tell her today that she may need higher dosages of primidone in the future.  2.  Leukopenia, chronic  -Records from hematology are reviewed.  It is felt that she has chronic, constitutional leukopenia which is common in patients of African-American heritage.  She has had an extensive work-up in the past including bone marrow aspirate and biopsy, which were negative.  3.  Follow up is anticipated in the next 6 months, sooner should new neurologic issues arise.   Cc:  Lucianne Lei, MD

## 2018-08-22 DIAGNOSIS — Z0000 Encounter for general adult medical examination without abnormal findings: Secondary | ICD-10-CM | POA: Diagnosis not present

## 2018-08-23 ENCOUNTER — Encounter: Payer: Self-pay | Admitting: Neurology

## 2018-08-23 ENCOUNTER — Ambulatory Visit (INDEPENDENT_AMBULATORY_CARE_PROVIDER_SITE_OTHER): Payer: Medicare Other | Admitting: Neurology

## 2018-08-23 VITALS — BP 110/70 | HR 78 | Ht 65.5 in | Wt 208.0 lb

## 2018-08-23 DIAGNOSIS — G250 Essential tremor: Secondary | ICD-10-CM

## 2018-08-23 NOTE — Patient Instructions (Signed)
Increase primidone to 50 mg twice per day.  When you need a refill, call me and not the pharmacy so that I can adjust the number of pills you get per month.    The physicians and staff at [facility] are committed to providing excellent care. You may receive a survey requesting feedback about your experience at our office. We strive to receive "very good" responses to the survey questions. If you feel that your experience would prevent you from giving the office a "very good " response, please contact our office to try to remedy the situation. We may be reached at [phone]. Thank you for taking the time out of your busy day to complete the survey.

## 2018-08-29 DIAGNOSIS — Z803 Family history of malignant neoplasm of breast: Secondary | ICD-10-CM | POA: Diagnosis not present

## 2018-08-29 DIAGNOSIS — Z1231 Encounter for screening mammogram for malignant neoplasm of breast: Secondary | ICD-10-CM | POA: Diagnosis not present

## 2018-08-30 DIAGNOSIS — M1712 Unilateral primary osteoarthritis, left knee: Secondary | ICD-10-CM | POA: Diagnosis not present

## 2018-08-30 DIAGNOSIS — M25662 Stiffness of left knee, not elsewhere classified: Secondary | ICD-10-CM | POA: Diagnosis not present

## 2018-08-30 DIAGNOSIS — Z96659 Presence of unspecified artificial knee joint: Secondary | ICD-10-CM | POA: Diagnosis not present

## 2018-08-30 DIAGNOSIS — M2170 Unequal limb length (acquired), unspecified site: Secondary | ICD-10-CM | POA: Diagnosis not present

## 2018-08-30 DIAGNOSIS — M170 Bilateral primary osteoarthritis of knee: Secondary | ICD-10-CM | POA: Diagnosis not present

## 2018-08-30 DIAGNOSIS — M25761 Osteophyte, right knee: Secondary | ICD-10-CM | POA: Diagnosis not present

## 2018-08-30 DIAGNOSIS — T8484XA Pain due to internal orthopedic prosthetic devices, implants and grafts, initial encounter: Secondary | ICD-10-CM | POA: Diagnosis not present

## 2018-08-30 DIAGNOSIS — M25762 Osteophyte, left knee: Secondary | ICD-10-CM | POA: Diagnosis not present

## 2018-08-30 DIAGNOSIS — Z96652 Presence of left artificial knee joint: Secondary | ICD-10-CM | POA: Diagnosis not present

## 2018-09-06 ENCOUNTER — Ambulatory Visit: Payer: Medicare Other | Admitting: Allergy

## 2018-09-10 ENCOUNTER — Telehealth: Payer: Self-pay | Admitting: Allergy

## 2018-09-10 NOTE — Telephone Encounter (Signed)
Left message for patient to call back. I did leave the [place] number.

## 2018-09-10 NOTE — Telephone Encounter (Signed)
I also explained to her why Dr.Padgett wanted to reschedule the appointment as Dr.Padgett documented in previous message.

## 2018-09-10 NOTE — Telephone Encounter (Signed)
See route message.

## 2018-09-10 NOTE — Telephone Encounter (Signed)
Patient called back and I rescheduled her appointment for May 27th at 3:30 pm in the [place] office with Dr.Padgett.

## 2018-09-11 ENCOUNTER — Telehealth: Payer: Self-pay | Admitting: Neurology

## 2018-09-11 MED ORDER — PRIMIDONE 50 MG PO TABS: 50.0000 mg | ORAL_TABLET | Freq: Two times a day (BID) | ORAL | 1 refills | Status: DC

## 2018-09-11 NOTE — Telephone Encounter (Signed)
RX sent to pharmacy  

## 2018-09-11 NOTE — Telephone Encounter (Signed)
Patient called regarding her medication Primidone 50 MG. She was told to contact our office when she was almost out. She uses [facility] on [address]. Thanks

## 2018-09-12 ENCOUNTER — Ambulatory Visit: Payer: Medicare Other | Admitting: Allergy

## 2018-10-24 DIAGNOSIS — M545 Low back pain: Secondary | ICD-10-CM | POA: Diagnosis not present

## 2018-10-24 DIAGNOSIS — M25561 Pain in right knee: Secondary | ICD-10-CM | POA: Diagnosis not present

## 2018-10-24 DIAGNOSIS — M25562 Pain in left knee: Secondary | ICD-10-CM | POA: Diagnosis not present

## 2018-10-24 DIAGNOSIS — G894 Chronic pain syndrome: Secondary | ICD-10-CM | POA: Diagnosis not present

## 2018-10-24 DIAGNOSIS — Z89522 Acquired absence of left knee: Secondary | ICD-10-CM | POA: Diagnosis not present

## 2018-10-25 DIAGNOSIS — I10 Essential (primary) hypertension: Secondary | ICD-10-CM | POA: Diagnosis not present

## 2018-10-25 DIAGNOSIS — E785 Hyperlipidemia, unspecified: Secondary | ICD-10-CM | POA: Diagnosis not present

## 2018-10-25 DIAGNOSIS — E119 Type 2 diabetes mellitus without complications: Secondary | ICD-10-CM | POA: Diagnosis not present

## 2018-10-26 DIAGNOSIS — E1169 Type 2 diabetes mellitus with other specified complication: Secondary | ICD-10-CM | POA: Diagnosis not present

## 2018-10-26 DIAGNOSIS — I10 Essential (primary) hypertension: Secondary | ICD-10-CM | POA: Diagnosis not present

## 2018-10-26 DIAGNOSIS — M130 Polyarthritis, unspecified: Secondary | ICD-10-CM | POA: Diagnosis not present

## 2018-10-26 DIAGNOSIS — E782 Mixed hyperlipidemia: Secondary | ICD-10-CM | POA: Diagnosis not present

## 2018-11-14 ENCOUNTER — Ambulatory Visit (INDEPENDENT_AMBULATORY_CARE_PROVIDER_SITE_OTHER): Payer: Medicare Other | Admitting: Allergy

## 2018-11-14 ENCOUNTER — Encounter: Payer: Self-pay | Admitting: Allergy

## 2018-11-14 ENCOUNTER — Other Ambulatory Visit: Payer: Self-pay

## 2018-11-14 VITALS — BP 118/72 | HR 72 | Temp 97.9°F | Resp 18

## 2018-11-14 DIAGNOSIS — J310 Chronic rhinitis: Secondary | ICD-10-CM | POA: Diagnosis not present

## 2018-11-14 DIAGNOSIS — R059 Cough, unspecified: Secondary | ICD-10-CM

## 2018-11-14 DIAGNOSIS — R05 Cough: Secondary | ICD-10-CM

## 2018-11-14 MED ORDER — BUDESONIDE-FORMOTEROL FUMARATE 160-4.5 MCG/ACT IN AERO: 2.0000 | INHALATION_SPRAY | Freq: Two times a day (BID) | RESPIRATORY_TRACT | 5 refills | Status: DC

## 2018-11-14 MED ORDER — AZELASTINE HCL 0.1 % NA SOLN: 2.0000 | Freq: Two times a day (BID) | NASAL | 1 refills | Status: DC

## 2018-11-14 NOTE — Patient Instructions (Addendum)
Cough and rhinitis  - post nasal drip leading to throat irritation and cough  - continue nasal antihistamine Astelin 2 sprays each nostril 1-2 times a day for nasal drainage.    - continue use of saline wash prior to using your nasal spray  - continue singulair 10mg  at bedtime  - use Xopenex 2 inhalations every 4-6 hours as needed for cough/wheeze/shortness of breath/chest tightness.  This is a quick relief medication.     - increase Symbicort 18mcg 2 puffs twice a day   - continue RyVent 6mg  and may take up to twice a day to help with nasal drainage.    Follow-up 4 months or sooner if needed

## 2018-11-14 NOTE — Progress Notes (Signed)
Follow-up Note  RE: [name] MRN: [id number] DOB: 07-27-1940 Date of Office Visit: 11/14/2018   History of present illness: [name] is a 78 y.o. female presenting today for follow-up of chronic rhinitis and cough.  She was last seen in the office on 03/08/18 by myself.  She states she has been doing relatively well and has not had any major health changes, surgeries or hospitalizations since last visit.  She states that the Astelin does help control her nasal drainage/PND but she ran out and needs a refill.  She continues on singulair and ryvent and does find these to be helpful as well. With her cough she state she is needing to use albuterol daily.  She is currently on symbicort 66mcg and taking 2 puffs twice a day.  Denies nighttime awakenings.  She has not required any ED/UC visits or any systemic steroid needs since last visit.     Review of systems: Review of Systems  Constitutional: Negative for chills, fever and malaise/fatigue.  HENT: Negative for congestion, ear discharge, nosebleeds and sore throat.   Eyes: Negative for pain, discharge and redness.  Respiratory: Positive for cough. Negative for sputum production, shortness of breath and wheezing.   Cardiovascular: Negative for chest pain.  Gastrointestinal: Negative for abdominal pain, constipation, diarrhea, heartburn, nausea and vomiting.  Musculoskeletal: Negative for joint pain.  Skin: Negative for itching and rash.  Neurological: Negative for headaches.    All other systems negative unless noted above in HPI  Past medical/social/surgical/family history have been reviewed and are unchanged unless specifically indicated below.  No changes  Medication List: Allergies as of 11/14/2018      Reactions   Latex Shortness Of Breath      Medication List       Accurate as of Nov 14, 2018 11:59 PM. If you have any questions, ask your nurse or doctor.        STOP taking these medications    budesonide-formoterol 80-4.5 MCG/ACT inhaler Commonly known as:  Symbicort Replaced by:  budesonide-formoterol 160-4.5 MCG/ACT inhaler Stopped by:  Sonya Charmian Muff, MD     TAKE these medications   azelastine 0.1 % nasal spray Commonly known as:  ASTELIN Place 2 sprays into both nostrils 2 (two) times daily.   beclomethasone 80 MCG/ACT inhaler Commonly known as:  QVAR Inhale into the lungs.   betamethasone dipropionate 0.05 % ointment Commonly known as:  DIPROLENE   budesonide-formoterol 160-4.5 MCG/ACT inhaler Commonly known as:  Symbicort Inhale 2 puffs into the lungs 2 (two) times daily. Replaces:  budesonide-formoterol 80-4.5 MCG/ACT inhaler Started by:  Sonya Charmian Muff, MD   CALCIUM-MAGNESIUM-ZINC PO Take by mouth.   Carbinoxamine Maleate 6 MG Tabs Commonly known as:  RyVent Take 1 tablet by mouth every 8 (eight) hours.   cholecalciferol 25 MCG (1000 UT) tablet Commonly known as:  VITAMIN D3 Take 1,000 Units by mouth daily.   Cleocin-T 1 % lotion Generic drug:  clindamycin   clobetasol cream 0.05 % Commonly known as:  TEMOVATE   docusate sodium 100 MG capsule Commonly known as:  COLACE Take 100 mg by mouth daily as needed for mild constipation.   Livalo 4 MG Tabs Generic drug:  Pitavastatin Calcium Take by mouth.   montelukast 10 MG tablet Commonly known as:  SINGULAIR Take 1 tablet (10 mg total) by mouth at bedtime.   multivitamin-iron-minerals-folic acid chewable tablet Chew 1 tablet by mouth daily.   niacin 500 MG CR tablet Commonly  known as:  NIASPAN Take 500 mg by mouth at bedtime.   olmesartan-hydrochlorothiazide 20-12.5 MG tablet Commonly known as:  BENICAR HCT Take 1 tablet by mouth daily.   Pennsaid 1.5 % Soln Generic drug:  Diclofenac Sodium Place onto the skin. Reported on 08/26/2015   primidone 50 MG tablet Commonly known as:  MYSOLINE Take 1 tablet (50 mg total) by mouth 2 (two) times daily.   Vitamin C 250 MG  Chew Chew 250 mg by mouth.   Welchol 3.75 g Pack Generic drug:  Colesevelam HCl       Known medication allergies: Allergies  Allergen Reactions  . Latex Shortness Of Breath     Physical examination: Blood pressure 118/72, pulse 72, temperature 97.9 F (36.6 C), temperature source Tympanic, resp. rate 18, SpO2 96 %.  General: Alert, interactive, in no acute distress. HEENT: PERRLA, TMs pearly gray, turbinates minimally edematous with clear discharge, post-pharynx non erythematous. Neck: Supple without lymphadenopathy. Lungs: Clear to auscultation without wheezing, rhonchi or rales. {no increased work of breathing. CV: Normal S1, S2 without murmurs. Abdomen: Nondistended, nontender. Skin: Warm and dry, without lesions or rashes. Extremities:  No clubbing, cyanosis or edema. Neuro:   Grossly intact.  Diagnositics/Labs: None today  Assessment and plan: Patient Instructions  Cough and rhinitis  - post nasal drip leading to throat irritation and cough  - continue nasal antihistamine Astelin 2 sprays each nostril 1-2 times a day for nasal drainage.    - continue use of saline wash prior to using your nasal spray  - continue singulair 10mg  at bedtime  - use Xopenex 2 inhalations every 4-6 hours as needed for cough/wheeze/shortness of breath/chest tightness.  This is a quick relief medication.     - increase Symbicort 17mcg 2 puffs twice a day   - continue RyVent 6mg  and may take up to twice a day to help with nasal drainage.    Follow-up 4 months or sooner if needed  I appreciate the opportunity to take part in [name]'s care. Please do not hesitate to contact me with questions.  Sincerely,   Prudy Feeler, MD Allergy/Immunology [facility]

## 2018-11-16 DIAGNOSIS — Z96652 Presence of left artificial knee joint: Secondary | ICD-10-CM | POA: Diagnosis not present

## 2018-11-16 DIAGNOSIS — T8484XA Pain due to internal orthopedic prosthetic devices, implants and grafts, initial encounter: Secondary | ICD-10-CM | POA: Diagnosis not present

## 2018-11-16 DIAGNOSIS — M869 Osteomyelitis, unspecified: Secondary | ICD-10-CM | POA: Diagnosis not present

## 2018-11-19 DIAGNOSIS — H6123 Impacted cerumen, bilateral: Secondary | ICD-10-CM | POA: Diagnosis not present

## 2018-12-03 DIAGNOSIS — Z96652 Presence of left artificial knee joint: Secondary | ICD-10-CM | POA: Diagnosis not present

## 2018-12-10 ENCOUNTER — Other Ambulatory Visit: Payer: Self-pay

## 2018-12-10 ENCOUNTER — Ambulatory Visit (INDEPENDENT_AMBULATORY_CARE_PROVIDER_SITE_OTHER): Payer: Medicare Other | Admitting: Physician Assistant

## 2018-12-10 ENCOUNTER — Encounter: Payer: Self-pay | Admitting: Orthopedic Surgery

## 2018-12-10 VITALS — Ht 65.0 in | Wt 208.0 lb

## 2018-12-10 DIAGNOSIS — M546 Pain in thoracic spine: Secondary | ICD-10-CM

## 2018-12-10 DIAGNOSIS — M545 Low back pain, unspecified: Secondary | ICD-10-CM

## 2018-12-10 DIAGNOSIS — G8929 Other chronic pain: Secondary | ICD-10-CM

## 2018-12-10 DIAGNOSIS — L669 Cicatricial alopecia, unspecified: Secondary | ICD-10-CM | POA: Diagnosis not present

## 2018-12-10 DIAGNOSIS — Z79899 Other long term (current) drug therapy: Secondary | ICD-10-CM | POA: Diagnosis not present

## 2018-12-10 DIAGNOSIS — L680 Hirsutism: Secondary | ICD-10-CM | POA: Diagnosis not present

## 2018-12-10 DIAGNOSIS — L658 Other specified nonscarring hair loss: Secondary | ICD-10-CM | POA: Diagnosis not present

## 2018-12-10 DIAGNOSIS — L661 Lichen planopilaris: Secondary | ICD-10-CM | POA: Diagnosis not present

## 2018-12-10 MED ORDER — TRAMADOL HCL 50 MG PO TABS: 50.0000 mg | ORAL_TABLET | Freq: Four times a day (QID) | ORAL | 0 refills | Status: DC | PRN

## 2018-12-10 NOTE — Progress Notes (Signed)
Office Visit Note   Patient: [name]           Date of Birth: 31-May-1941           MRN: [id number] Visit Date: 12/10/2018              Requested by: Lucianne Lei, MD [address],  [place] [address] PCP: Lucianne Lei, MD  Chief Complaint  Patient presents with  . Lower Back - Pain    S/p ESI with FN 08/13/18      HPI: The patient is a 78 yo woman who is seen for follow up of her mid low back and upper back pain. She reports she underwent a LESI several weeks ago by Dr. Ernestina Patches and had excellent results. She reports she felt she was doing great until last Friday when she developed severe 8/10 pain over the lower back and right upper back area . She reports no history of trauma, no new activities or other instigating factors. She reports she took at hot shower and applied some Voltaren gel over the area and this helped and got her pain down to a 4/10 level. She reports she relaxed over the weekend and this also helped a lot. She does not want anymore oral steroids. She is interested in trying some physical therapy to see if this will improve her symptoms/pain.   Assessment & Plan: Visit Diagnoses:  1. Chronic midline low back pain without sciatica   2. Pain in thoracic spine   3. Acute midline low back pain without sciatica     Plan: Referred to [facility] for back conditioning program.  tramadol 50 mg prn #10 ordered for 1 time only for more severe symptoms. Continue Voltaren gel prn  Follow up in 4 weeks.    Follow-Up Instructions: Return in about 4 weeks (around 01/07/2019).   Ortho Exam  Patient is alert, oriented, no adenopathy, well-dressed, normal affect, normal respiratory effort. The patient ambulates with a single point cane on the right due to chronic pain over the left knee. She uses her hands to push up from the seat to go from sitting to standing. No focal motor weakness with good strength in bilateral lower extremities distally and her  strength is symmetric except for weakness over the left quads related to her chronic left knee pain. She is sensory intact. She has tenderness to palpation over the paraspinals bilaterally over the lower back and on the right in the thoracic area.   Imaging: No results found. No images are attached to the encounter.  Labs: No results found for: HGBA1C, ESRSEDRATE, CRP, LABURIC, REPTSTATUS, GRAMSTAIN, CULT, LABORGA   No results found for: ALBUMIN, PREALBUMIN, LABURIC  Body mass index is 34.61 kg/m.  Orders:  Orders Placed This Encounter  Procedures  . Ambulatory referral to Physical Therapy   Meds ordered this encounter  Medications  . traMADol (ULTRAM) 50 MG tablet    Sig: Take 1 tablet (50 mg total) by mouth every 6 (six) hours as needed. Maximum dose= 8 tablets per day    Dispense:  10 tablet    Refill:  0     Procedures: No procedures performed  Clinical Data: No additional findings.  ROS:  All other systems negative, except as noted in the HPI. Review of Systems  Objective: Vital Signs: Ht 5\' 5"  (1.651 m)   Wt 208 lb (94.3 kg)   BMI 34.61 kg/m   Specialty Comments:  No  specialty comments available.  PMFS History: Patient Active Problem List   Diagnosis Date Noted  . Unilateral primary osteoarthritis, right knee 11/21/2017  . Status post revision of total knee, left 11/21/2017  . Chronic rhinitis 02/25/2016  . Cough 02/25/2016  . Hoarseness of voice 02/25/2016  . Chronic leukopenia 12/15/2015   Past Medical History:  Diagnosis Date  . Asthma    allergic induced  . Cough   . Hypertension   . Rhinitis     Family History  Problem Relation Age of Onset  . Other Mother        died when patient was 77 months old  . Hypertension Father   . Cerebral aneurysm Sister   . Breast cancer Sister   . Lung cancer Sister   . Healthy Son   . Allergic rhinitis Neg Hx   . Angioedema Neg Hx   . Asthma Neg Hx   . Eczema Neg Hx   . Immunodeficiency Neg Hx    . Urticaria Neg Hx     Past Surgical History:  Procedure Laterality Date  . ABDOMINAL HYSTERECTOMY    . BACK SURGERY    . CATARACT EXTRACTION    . MASS EXCISION  01/05/2012   Procedure: MINOR EXCISION OF MASS;  Surgeon: Cammie Sickle., MD;  Location: [place];  Service: Orthopedics;  Laterality: Right;  excision mucoid cyst right long finger, debride DIP joint  . ROTATOR CUFF REPAIR Bilateral   . TOTAL KNEE ARTHROPLASTY Left    twice   Social History   Occupational History  . Occupation: retired    Comment: Warehouse manager  Tobacco Use  . Smoking status: Never Smoker  . Smokeless tobacco: Never Used  Substance and Sexual Activity  . Alcohol use: No    Alcohol/week: 0.0 standard drinks  . Drug use: No  . Sexual activity: Not on file    Comment: retired from [employer]. 1 son.

## 2018-12-20 ENCOUNTER — Other Ambulatory Visit: Payer: Self-pay

## 2018-12-20 ENCOUNTER — Ambulatory Visit: Payer: Medicare Other | Attending: Physician Assistant

## 2018-12-20 DIAGNOSIS — M25651 Stiffness of right hip, not elsewhere classified: Secondary | ICD-10-CM | POA: Diagnosis not present

## 2018-12-20 DIAGNOSIS — M545 Low back pain, unspecified: Secondary | ICD-10-CM

## 2018-12-20 DIAGNOSIS — M25662 Stiffness of left knee, not elsewhere classified: Secondary | ICD-10-CM | POA: Diagnosis not present

## 2018-12-20 DIAGNOSIS — R293 Abnormal posture: Secondary | ICD-10-CM

## 2018-12-20 DIAGNOSIS — G8929 Other chronic pain: Secondary | ICD-10-CM | POA: Insufficient documentation

## 2018-12-20 DIAGNOSIS — M62830 Muscle spasm of back: Secondary | ICD-10-CM

## 2018-12-20 NOTE — Therapy (Signed)
[place], [place], [address] Phone: [phone]   Fax:  [phone]  Physical Therapy Evaluation  Patient Details  Name: [name] MRN: [id number] Date of Birth: April 21, 1941 Referring Provider (PT): Shawn Rayburn  PA-c   Encounter Date: 12/20/2018  PT End of Session - 12/20/18 1003    Visit Number  1    Number of Visits  12    Date for PT Re-Evaluation  02/01/19    Authorization Type  MCR    PT Start Time  1000    PT Stop Time  1045    PT Time Calculation (min)  45 min    Activity Tolerance  Patient tolerated treatment well    Behavior During Therapy  [facility] for tasks assessed/performed       Past Medical History:  Diagnosis Date  . Asthma    allergic induced  . Cough   . Hypertension   . Rhinitis     Past Surgical History:  Procedure Laterality Date  . ABDOMINAL HYSTERECTOMY    . BACK SURGERY    . CATARACT EXTRACTION    . MASS EXCISION  01/05/2012   Procedure: MINOR EXCISION OF MASS;  Surgeon: Cammie Sickle., MD;  Location: [place];  Service: Orthopedics;  Laterality: Right;  excision mucoid cyst right long finger, debride DIP joint  . ROTATOR CUFF REPAIR Bilateral   . TOTAL KNEE ARTHROPLASTY Left    twice    There were no vitals filed for this visit.   Subjective Assessment - 12/20/18 1006    Subjective  She reports OA and back pain chronic with pain going to RT scapula.   Pain has been for years.  06/2018 pain flared and  she received  injection and pain eased but returned a week ago  without injury or incident.  MD  issued medication but she did not take the medication.    Pertinent History  OA, 2010 lumbar, TKA  2003 and 2006    Limitations  Standing;Walking   bending , vaccuming , standing, was doing water aerobics but Covid limits   How long can you sit comfortably?  as needed    How long can you stand comfortably?  10 min    How long can you walk  comfortably?  Blocks    Diagnostic tests  See imaging    Patient Stated Goals  She wants to lear how to manage self    Currently in Pain?  Yes    Pain Score  5     Pain Location  Back    Pain Orientation  Right;Mid;Lower;Left   mid back on RT   Pain Descriptors / Indicators  Burning;Aching    Pain Type  Chronic pain    Pain Onset  More than a month ago    Pain Frequency  Constant    Aggravating Factors   bending, gardening , home tasks    Pain Relieving Factors  heat from shower.  cream,  relax         [facility] PT Assessment - 12/20/18 0001      Assessment   Medical Diagnosis  chronic lower back pain    Referring Provider (PT)  Shawn Rayburn  PA-c    Onset Date/Surgical Date  --   chronic for years with flare 06/2018 and 11/2018   Next MD Visit  1 month    Prior Therapy  PT post surgery 2010  Precautions   Precautions  None      Restrictions   Weight Bearing Restrictions  No      Balance Screen   Has the patient fallen in the past 6 months  No      Prior Function   Level of Independence  Independent    Vocation  Retired      Associate Professor   Overall Cognitive Status  Within Functional Limits for tasks assessed      Posture/Postural Control   Posture Comments  rounded shoulder  and incr T/S kyphosis, Decr LT knee flexion, flat lower back      ROM / Strength   AROM / PROM / Strength  AROM;PROM;Strength      AROM   AROM Assessment Site  Lumbar    Lumbar Flexion  90    Lumbar Extension  10    Lumbar - Right Side Bend  5    Lumbar - Left Side Bend  10      PROM   Overall PROM Comments  RT Hip rotation 20 degrees IR/ER,  Lt knee flexion 30 degrees ,          Strength   Overall Strength Comments  WFL      Flexibility   Soft Tissue Assessment /Muscle Length  yes    Hamstrings  90 degrees RT/LT      Palpation   Palpation comment  stiff thoracic > lumbar tender from mid thoracic to lower lumbar spine.       Ambulation/Gait   Gait Comments  No device stiffness of  RT knee no flexion                Objective measurements completed on examination: See above findings.              PT Education - 12/20/18 1101    Education Details  POC HEP          USE of tennis balls for STW    Person(s) Educated  Patient    Methods  Explanation;Demonstration;Verbal cues;Handout    Comprehension  Verbalized understanding;Returned demonstration       PT Short Term Goals - 12/20/18 1056      PT SHORT TERM GOAL #1   Title  She will be indpendent withj intial HEp    Time  3    Period  Weeks    Status  New      PT SHORT TERM GOAL #2   Title  She will report pain dcr 20% or more    Time  3    Period  Weeks    Status  New        PT Long Term Goals - 12/20/18 1056      PT LONG TERM GOAL #1   Title  She will be indpendent with all HEp issued    Time  6    Period  Weeks    Status  New      PT LONG TERM GOAL #2   Title  She will report pain as intermittant and decr 50% or more    Time  6    Period  Weeks    Status  New      PT LONG TERM GOAL #3   Title  She will report able to garden with 50% less pain    Time  6    Period  Weeks    Status  New      PT LONG  TERM GOAL #4   Title  She will report able to vaccum with 50% less pain    Time  6    Period  Weeks    Status  New             Plan - 12/20/18 1051    Clinical Impression Statement  Ms Mijangos presents with chronic lower back and thoracic pain with historey of lumbar syurgery and significant OA and stenosis in her lower back . Stiffness of RT knee will limit what she can do exercise for her pain. She should improve with skilled PT for ROM  and core strength.    Personal Factors and Comorbidities  Past/Current Experience;Age;Comorbidity 1;Time since onset of injury/illness/exacerbation    Comorbidities  severe deg changes to spine.    Examination-Activity Limitations  Bend;Reach Overhead;Carry;Squat;Other   yard work   Games developer;Yard Work    [employer]chant [employer] officer  Evolving/Moderate complexity    Clinical Decision Making  Moderate    Rehab Potential  Good    PT Frequency  2x / week    PT Duration  6 weeks    PT Treatment/Interventions  Electrical Stimulation;Moist Heat;Therapeutic exercise;Patient/family education;Manual techniques;Taping;Passive range of motion    PT Next Visit Plan  review stretch , start core strength,   manual and nodalities as needed    PT Home Exercise Plan  flexion stretch in sitting.   STW with tennis balls    Consulted and Agree with Plan of Care  Patient       Patient will benefit from skilled therapeutic intervention in order to improve the following deficits and impairments:  Pain, Postural dysfunction, Increased muscle spasms, Difficulty walking, Decreased activity tolerance, Decreased range of motion  Visit Diagnosis: 1. Chronic bilateral low back pain, unspecified whether sciatica present   2. Stiffness of right hip, not elsewhere classified   3. Muscle spasm of back   4. Abnormal posture   5. Stiffness of left knee, not elsewhere classified        Problem List Patient Active Problem List   Diagnosis Date Noted  . Unilateral primary osteoarthritis, right knee 11/21/2017  . Status post revision of total knee, left 11/21/2017  . Chronic rhinitis 02/25/2016  . Cough 02/25/2016  . Hoarseness of voice 02/25/2016  . Chronic leukopenia 12/15/2015    Darrel Hoover  PT 12/20/2018, 11:02 AM  [facility] [address] [place], [place], [address] Phone: [phone]   Fax:  [phone]  Name: BRISSIA DELISA MRN: [id number] Date of Birth: April 13, 1941

## 2018-12-20 NOTE — Patient Instructions (Signed)
Sitting flexion stretch every 2-3 hours 2-3 reps 10-30 sec

## 2019-01-01 ENCOUNTER — Other Ambulatory Visit: Payer: Self-pay

## 2019-01-01 ENCOUNTER — Ambulatory Visit: Payer: Medicare Other | Admitting: Physical Therapy

## 2019-01-01 DIAGNOSIS — M25651 Stiffness of right hip, not elsewhere classified: Secondary | ICD-10-CM | POA: Diagnosis not present

## 2019-01-01 DIAGNOSIS — M62830 Muscle spasm of back: Secondary | ICD-10-CM

## 2019-01-01 DIAGNOSIS — M545 Low back pain, unspecified: Secondary | ICD-10-CM

## 2019-01-01 DIAGNOSIS — R293 Abnormal posture: Secondary | ICD-10-CM

## 2019-01-01 DIAGNOSIS — M25662 Stiffness of left knee, not elsewhere classified: Secondary | ICD-10-CM

## 2019-01-01 DIAGNOSIS — G8929 Other chronic pain: Secondary | ICD-10-CM

## 2019-01-01 NOTE — Therapy (Signed)
[place], [place], [address] Phone: [phone]   Fax:  [phone]  Physical Therapy Treatment  Patient Details  Name: [name] MRN: [id number] Date of Birth: 11-14-1940 Referring Provider (PT): Shawn Rayburn  PA-c   Encounter Date: 01/01/2019  PT End of Session - 01/01/19 1152    Visit Number  2    Number of Visits  12    Date for PT Re-Evaluation  02/01/19    Authorization Type  MCR    PT Start Time  7341    PT Stop Time  0940    PT Time Calculation (min)  43 min       Past Medical History:  Diagnosis Date  . Asthma    allergic induced  . Cough   . Hypertension   . Rhinitis     Past Surgical History:  Procedure Laterality Date  . ABDOMINAL HYSTERECTOMY    . BACK SURGERY    . CATARACT EXTRACTION    . MASS EXCISION  01/05/2012   Procedure: MINOR EXCISION OF MASS;  Surgeon: Cammie Sickle., MD;  Location: [facility];  Service: Orthopedics;  Laterality: Right;  excision mucoid cyst right long finger, debride DIP joint  . ROTATOR CUFF REPAIR Bilateral   . TOTAL KNEE ARTHROPLASTY Left    twice    There were no vitals filed for this visit.  Subjective Assessment - 01/01/19 1206    Subjective  Pt reports pain not so bad today.    Currently in Pain?  Yes    Pain Score  4     Pain Location  Back    Pain Orientation  Lower    Pain Descriptors / Indicators  Aching                       [facility] Adult PT Treatment/Exercise - 01/01/19 0001      Lumbar Exercises: Stretches   Active Hamstring Stretch  2 reps;30 seconds    Active Hamstring Stretch Limitations  supine    Lower Trunk Rotation  5 reps;10 seconds   with LEs over ball    Lower Trunk Rotation Limitations  open books from side lying with tactile assist for LEs     Pelvic Tilt  15 reps    Pelvic Tilt Limitations  with legs over tall bolster.     Other Lumbar Stretch Exercise  seated flexion  stretch per initial HEP       Lumbar Exercises: Supine   Bridge  10 reps    Bridge Limitations  with feet over tall bolster              PT Education - 01/01/19 1151    Education Details  HEP    Person(s) Educated  Patient    Methods  Explanation;Handout    Comprehension  Verbalized understanding       PT Short Term Goals - 12/20/18 1056      PT SHORT TERM GOAL #1   Title  She will be indpendent withj intial HEp    Time  3    Period  Weeks    Status  New      PT SHORT TERM GOAL #2   Title  She will report pain dcr 20% or more    Time  3    Period  Weeks    Status  New        PT Long  Term Goals - 12/20/18 1056      PT LONG TERM GOAL #1   Title  She will be indpendent with all HEp issued    Time  6    Period  Weeks    Status  New      PT LONG TERM GOAL #2   Title  She will report pain as intermittant and decr 50% or more    Time  6    Period  Weeks    Status  New      PT LONG TERM GOAL #3   Title  She will report able to garden with 50% less pain    Time  6    Period  Weeks    Status  New      PT LONG TERM GOAL #4   Title  She will report able to vaccum with 50% less pain    Time  6    Period  Weeks    Status  New            Plan - 01/01/19 1212    Clinical Impression Statement  Pt reports compliance with initial HEP. Due to lack of left knee flexion, core exercises established using bolster or physioball under knees. Able to progress HEP.    PT Next Visit Plan  review stretch , review and progress core strength as able ,   manual and nodalities as needed    PT Home Exercise Plan  flexion stretch in sitting.   STW with tennis balls, bridge with feet on ball, upper trunk rotation, LTR with feet on ball, hamstring stretch    Consulted and Agree with Plan of Care  Patient       Patient will benefit from skilled therapeutic intervention in order to improve the following deficits and impairments:  Pain, Postural dysfunction, Increased muscle  spasms, Difficulty walking, Decreased activity tolerance, Decreased range of motion  Visit Diagnosis: 1. Chronic bilateral low back pain, unspecified whether sciatica present   2. Stiffness of right hip, not elsewhere classified   3. Muscle spasm of back   4. Abnormal posture   5. Stiffness of left knee, not elsewhere classified        Problem List Patient Active Problem List   Diagnosis Date Noted  . Unilateral primary osteoarthritis, right knee 11/21/2017  . Status post revision of total knee, left 11/21/2017  . Chronic rhinitis 02/25/2016  . Cough 02/25/2016  . Hoarseness of voice 02/25/2016  . Chronic leukopenia 12/15/2015    Dorene Ar, PTA 01/01/2019, 12:17 PM  [facility] [address] [place], [place], [address] Phone: [phone]   Fax:  [phone]  Name: [name] MRN: [id number] Date of Birth: 30-Dec-1940

## 2019-01-03 ENCOUNTER — Ambulatory Visit: Payer: Medicare Other | Admitting: Physical Therapy

## 2019-01-03 ENCOUNTER — Encounter: Payer: Self-pay | Admitting: Physical Therapy

## 2019-01-03 ENCOUNTER — Other Ambulatory Visit: Payer: Self-pay

## 2019-01-03 DIAGNOSIS — R293 Abnormal posture: Secondary | ICD-10-CM

## 2019-01-03 DIAGNOSIS — M25662 Stiffness of left knee, not elsewhere classified: Secondary | ICD-10-CM | POA: Diagnosis not present

## 2019-01-03 DIAGNOSIS — M62830 Muscle spasm of back: Secondary | ICD-10-CM

## 2019-01-03 DIAGNOSIS — M25651 Stiffness of right hip, not elsewhere classified: Secondary | ICD-10-CM | POA: Diagnosis not present

## 2019-01-03 DIAGNOSIS — M545 Low back pain, unspecified: Secondary | ICD-10-CM

## 2019-01-03 DIAGNOSIS — G8929 Other chronic pain: Secondary | ICD-10-CM

## 2019-01-03 NOTE — Therapy (Signed)
[place], [place], [address] Phone: [phone]   Fax:  [phone]  Physical Therapy Treatment  Patient Details  Name: [name] MRN: [id number] Date of Birth: [birth year] Referring Provider (PT): Shawn Rayburn  PA-c   Encounter Date: 01/03/2019  PT End of Session - 01/03/19 0919    Visit Number  3    Number of Visits  12    Date for PT Re-Evaluation  02/01/19    PT Start Time  0915    PT Stop Time  1005    PT Time Calculation (min)  50 min       Past Medical History:  Diagnosis Date  . Asthma    allergic induced  . Cough   . Hypertension   . Rhinitis     Past Surgical History:  Procedure Laterality Date  . ABDOMINAL HYSTERECTOMY    . BACK SURGERY    . CATARACT EXTRACTION    . MASS EXCISION  01/05/2012   Procedure: MINOR EXCISION OF MASS;  Surgeon: Cammie Sickle., MD;  Location: [facility];  Service: Orthopedics;  Laterality: Right;  excision mucoid cyst right long finger, debride DIP joint  . ROTATOR CUFF REPAIR Bilateral   . TOTAL KNEE ARTHROPLASTY Left    twice    There were no vitals filed for this visit.  Subjective Assessment - 01/03/19 0917    Subjective  I was hurting yesterday.    Currently in Pain?  Yes    Pain Score  4     Pain Location  Back    Pain Orientation  Lower    Pain Descriptors / Indicators  Aching                       [facility] Adult PT Treatment/Exercise - 01/03/19 0001      Lumbar Exercises: Stretches   Active Hamstring Stretch  2 reps;30 seconds    Active Hamstring Stretch Limitations  supine    Lower Trunk Rotation  5 reps;10 seconds   with LEs over ball    Lower Trunk Rotation Limitations  open books from side lying with tactile assist for LEs     Pelvic Tilt  15 reps    Pelvic Tilt Limitations  with legs over tall bolster.     Other Lumbar Stretch Exercise  seated flexion stretch per initial HEP       Lumbar  Exercises: Supine   Bridge  10 reps    Bridge Limitations  with feet over tall bolster     Other Supine Lumbar Exercises  ball squeeze with legs over bolster x15      Modalities   Modalities  Electrical Stimulation;Moist Heat      Moist Heat Therapy   Number Minutes Moist Heat  15 Minutes    Moist Heat Location  Lumbar Spine      Electrical Stimulation   Electrical Stimulation Location  Lumbar    Electrical Stimulation Action  IFC x 15 min    Electrical Stimulation Parameters  to tolerance    Electrical Stimulation Goals  Pain               PT Short Term Goals - 12/20/18 1056      PT SHORT TERM GOAL #1   Title  She will be indpendent withj intial HEp    Time  3    Period  Weeks    Status  New      PT SHORT TERM GOAL #2   Title  She will report pain dcr 20% or more    Time  3    Period  Weeks    Status  New        PT Long Term Goals - 12/20/18 1056      PT LONG TERM GOAL #1   Title  She will be indpendent with all HEp issued    Time  6    Period  Weeks    Status  New      PT LONG TERM GOAL #2   Title  She will report pain as intermittant and decr 50% or more    Time  6    Period  Weeks    Status  New      PT LONG TERM GOAL #3   Title  She will report able to garden with 50% less pain    Time  6    Period  Weeks    Status  New      PT LONG TERM GOAL #4   Title  She will report able to vaccum with 50% less pain    Time  6    Period  Weeks    Status  New            Plan - 01/03/19 1051    Clinical Impression Statement  Pt reports increased achiness and soreness the day after last treatment. Continued with gentle core and hip strength and performed a trial of IFC/HMP at end of session to decrease pain and soreness.    PT Next Visit Plan  assess response to IFC/HMP, review stretch , review and progress core strength as able ,   manual and nodalities as needed    PT Home Exercise Plan  flexion stretch in sitting.   STW with tennis balls,  bridge with feet on ball, upper trunk rotation, LTR with feet on ball, hamstring stretch       Patient will benefit from skilled therapeutic intervention in order to improve the following deficits and impairments:  Pain, Postural dysfunction, Increased muscle spasms, Difficulty walking, Decreased activity tolerance, Decreased range of motion  Visit Diagnosis: 1. Chronic bilateral low back pain, unspecified whether sciatica present   2. Stiffness of right hip, not elsewhere classified   3. Muscle spasm of back   4. Abnormal posture   5. Stiffness of left knee, not elsewhere classified        Problem List Patient Active Problem List   Diagnosis Date Noted  . Unilateral primary osteoarthritis, right knee 11/21/2017  . Status post revision of total knee, left 11/21/2017  . Chronic rhinitis 02/25/2016  . Cough 02/25/2016  . Hoarseness of voice 02/25/2016  . Chronic leukopenia 12/15/2015    Dorene Ar, PTA 01/03/2019, 10:53 AM  [facility] [address] [place], [place], [address] Phone: [phone]   Fax:  [phone]  Name: [name] MRN: [id number] Date of Birth: [birth year]

## 2019-01-07 ENCOUNTER — Other Ambulatory Visit: Payer: Self-pay

## 2019-01-07 ENCOUNTER — Encounter: Payer: Self-pay | Admitting: Physical Therapy

## 2019-01-07 ENCOUNTER — Ambulatory Visit: Payer: Medicare Other | Admitting: Physical Therapy

## 2019-01-07 DIAGNOSIS — M545 Low back pain, unspecified: Secondary | ICD-10-CM

## 2019-01-07 DIAGNOSIS — M25662 Stiffness of left knee, not elsewhere classified: Secondary | ICD-10-CM

## 2019-01-07 DIAGNOSIS — R293 Abnormal posture: Secondary | ICD-10-CM | POA: Diagnosis not present

## 2019-01-07 DIAGNOSIS — G8929 Other chronic pain: Secondary | ICD-10-CM | POA: Diagnosis not present

## 2019-01-07 DIAGNOSIS — M62830 Muscle spasm of back: Secondary | ICD-10-CM

## 2019-01-07 DIAGNOSIS — M25651 Stiffness of right hip, not elsewhere classified: Secondary | ICD-10-CM | POA: Diagnosis not present

## 2019-01-07 NOTE — Therapy (Signed)
[place], [place], [address] Phone: [phone]   Fax:  [phone]  Physical Therapy Treatment  Patient Details  Name: [name] MRN: [id number] Date of Birth: July 27, 1940 Referring Provider (PT): Shawn Rayburn  PA-c   Encounter Date: 01/07/2019  PT End of Session - 01/07/19 7867    Visit Number  4    Number of Visits  12    Date for PT Re-Evaluation  02/01/19    Authorization Type  MCR    PT Start Time  0915    PT Stop Time  1010    PT Time Calculation (min)  55 min       Past Medical History:  Diagnosis Date  . Asthma    allergic induced  . Cough   . Hypertension   . Rhinitis     Past Surgical History:  Procedure Laterality Date  . ABDOMINAL HYSTERECTOMY    . BACK SURGERY    . CATARACT EXTRACTION    . MASS EXCISION  01/05/2012   Procedure: MINOR EXCISION OF MASS;  Surgeon: Cammie Sickle., MD;  Location: [facility];  Service: Orthopedics;  Laterality: Right;  excision mucoid cyst right long finger, debride DIP joint  . ROTATOR CUFF REPAIR Bilateral   . TOTAL KNEE ARTHROPLASTY Left    twice    There were no vitals filed for this visit.  Subjective Assessment - 01/07/19 0921    Subjective  I felt pretty good after the last session. The electrical stimulation was helpful.    Currently in Pain?  Yes    Pain Score  4     Pain Location  Back    Pain Orientation  Lower    Pain Descriptors / Indicators  Aching    Aggravating Factors   bending, first get up in the morning.    Pain Relieving Factors  Get moving in the morning, hot shower, rest, cream                       [facility] Adult PT Treatment/Exercise - 01/07/19 0001      Lumbar Exercises: Stretches   Active Hamstring Stretch  3 reps;30 seconds    Active Hamstring Stretch Limitations  supine    Lower Trunk Rotation  5 reps;10 seconds   with LEs over ball    Lower Trunk Rotation Limitations   open books from side lying with tactile assist for LEs     Pelvic Tilt  15 reps    Pelvic Tilt Limitations  with legs over tall bolster.     Other Lumbar Stretch Exercise  seated flexion stretch per initial HEP       Lumbar Exercises: Supine   Bridge  15 reps    Bridge Limitations  with feet over tall bolster     Other Supine Lumbar Exercises  ball squeeze with legs over bolster x15      Moist Heat Therapy   Number Minutes Moist Heat  15 Minutes    Moist Heat Location  Lumbar Spine      Electrical Stimulation   Electrical Stimulation Location  Lumbar    Electrical Stimulation Action  IFC x 15 min    Electrical Stimulation Parameters  to tolerance     Electrical Stimulation Goals  Pain               PT Short Term Goals - 12/20/18 1056  PT SHORT TERM GOAL #1   Title  She will be indpendent withj intial HEp    Time  3    Period  Weeks    Status  New      PT SHORT TERM GOAL #2   Title  She will report pain dcr 20% or more    Time  3    Period  Weeks    Status  New        PT Long Term Goals - 12/20/18 1056      PT LONG TERM GOAL #1   Title  She will be indpendent with all HEp issued    Time  6    Period  Weeks    Status  New      PT LONG TERM GOAL #2   Title  She will report pain as intermittant and decr 50% or more    Time  6    Period  Weeks    Status  New      PT LONG TERM GOAL #3   Title  She will report able to garden with 50% less pain    Time  6    Period  Weeks    Status  New      PT LONG TERM GOAL #4   Title  She will report able to vaccum with 50% less pain    Time  6    Period  Weeks    Status  New            Plan - 01/07/19 4174    Clinical Impression Statement  Pt reports IFC/HMP helpful after last session. Continued with core and hip strength and trunk stretching. Repeated IFC/HMP for pain relief.    Personal Factors and Comorbidities  Past/Current Experience;Age;Comorbidity 1;Time since onset of  injury/illness/exacerbation    PT Next Visit Plan  assess response to IFC/HMP, review stretch , review and progress core strength as able ,   manual and nodalities as needed    PT Home Exercise Plan  flexion stretch in sitting.   STW with tennis balls, bridge with feet on ball, upper trunk rotation, LTR with feet on ball, hamstring stretch       Patient will benefit from skilled therapeutic intervention in order to improve the following deficits and impairments:  Pain, Postural dysfunction, Increased muscle spasms, Difficulty walking, Decreased activity tolerance, Decreased range of motion  Visit Diagnosis: 1. Chronic bilateral low back pain, unspecified whether sciatica present   2. Stiffness of right hip, not elsewhere classified   3. Muscle spasm of back   4. Abnormal posture   5. Stiffness of left knee, not elsewhere classified        Problem List Patient Active Problem List   Diagnosis Date Noted  . Unilateral primary osteoarthritis, right knee 11/21/2017  . Status post revision of total knee, left 11/21/2017  . Chronic rhinitis 02/25/2016  . Cough 02/25/2016  . Hoarseness of voice 02/25/2016  . Chronic leukopenia 12/15/2015    Dorene Ar, PTA 01/07/2019, 9:57 AM  [facility] [address] [place], [place], [address] Phone: [phone]   Fax:  [phone]  Name: [name] MRN: [id number] Date of Birth: 01-04-1941

## 2019-01-09 ENCOUNTER — Encounter: Payer: Self-pay | Admitting: Physical Therapy

## 2019-01-09 ENCOUNTER — Ambulatory Visit: Payer: Medicare Other | Admitting: Physical Therapy

## 2019-01-09 ENCOUNTER — Other Ambulatory Visit: Payer: Self-pay

## 2019-01-09 DIAGNOSIS — M25662 Stiffness of left knee, not elsewhere classified: Secondary | ICD-10-CM

## 2019-01-09 DIAGNOSIS — R293 Abnormal posture: Secondary | ICD-10-CM

## 2019-01-09 DIAGNOSIS — G8929 Other chronic pain: Secondary | ICD-10-CM

## 2019-01-09 DIAGNOSIS — M62830 Muscle spasm of back: Secondary | ICD-10-CM

## 2019-01-09 DIAGNOSIS — M545 Low back pain, unspecified: Secondary | ICD-10-CM

## 2019-01-09 DIAGNOSIS — M25651 Stiffness of right hip, not elsewhere classified: Secondary | ICD-10-CM | POA: Diagnosis not present

## 2019-01-09 NOTE — Therapy (Signed)
[place], [place], [address] Phone: [phone]   Fax:  [phone]  Physical Therapy Treatment  Patient Details  Name: [name] MRN: [id number] Date of Birth: 25-May-1941 Referring Provider (PT): Shawn Rayburn  PA-c   Encounter Date: 01/09/2019  PT End of Session - 01/09/19 0921    Visit Number  5    Number of Visits  12    Date for PT Re-Evaluation  02/01/19    Authorization Type  MCR    PT Start Time  7867    PT Stop Time  1006    PT Time Calculation (min)  48 min       Past Medical History:  Diagnosis Date  . Asthma    allergic induced  . Cough   . Hypertension   . Rhinitis     Past Surgical History:  Procedure Laterality Date  . ABDOMINAL HYSTERECTOMY    . BACK SURGERY    . CATARACT EXTRACTION    . MASS EXCISION  01/05/2012   Procedure: MINOR EXCISION OF MASS;  Surgeon: Cammie Sickle., MD;  Location: [facility];  Service: Orthopedics;  Laterality: Right;  excision mucoid cyst right long finger, debride DIP joint  . ROTATOR CUFF REPAIR Bilateral   . TOTAL KNEE ARTHROPLASTY Left    twice    There were no vitals filed for this visit.  Subjective Assessment - 01/09/19 0920    Subjective  Still trying to get an exercise ball. Trying my exercises at home.    Currently in Pain?  Yes    Pain Score  4     Pain Location  Back    Pain Orientation  Lower    Pain Descriptors / Indicators  Aching                       [facility] Adult PT Treatment/Exercise - 01/09/19 0001      Lumbar Exercises: Stretches   Active Hamstring Stretch  3 reps;30 seconds    Active Hamstring Stretch Limitations  supine    Lower Trunk Rotation  --    Lower Trunk Rotation Limitations  open books from side lying with tactile assist for LEs     Pelvic Tilt  --    Pelvic Tilt Limitations  --    Other Lumbar Stretch Exercise  --      Lumbar Exercises: Supine   Clam  --    Clam Limitations  --    Bent Knee Raise  10 reps    Bent Knee Raise Limitations  alt. lift from bolster with abdominal draw in , added opp arm reach x 10     Bridge  --    Bridge Limitations  --    Bridge with [employer]  15 reps    Bridge with [employer] Limitations  with feet over tall bolster    Other Supine Lumbar Exercises  ball squeeze with legs over bolster x10      Lumbar Exercises: Sidelying   Hip Abduction  Right;Left;20 reps    Hip Abduction Limitations  10 reps abduction from flexion, then glut med abduction x 10       Moist Heat Therapy   Number Minutes Moist Heat  15 Minutes    Moist Heat Location  Lumbar Spine      Electrical Stimulation   Electrical Stimulation Location  Lumbar    Electrical Stimulation Action  IFC  x 15 min    Electrical Stimulation Parameters  10 mA    Electrical Stimulation Goals  Pain               PT Short Term Goals - 12/20/18 1056      PT SHORT TERM GOAL #1   Title  She will be indpendent withj intial HEp    Time  3    Period  Weeks    Status  New      PT SHORT TERM GOAL #2   Title  She will report pain dcr 20% or more    Time  3    Period  Weeks    Status  New        PT Long Term Goals - 12/20/18 1056      PT LONG TERM GOAL #1   Title  She will be indpendent with all HEp issued    Time  6    Period  Weeks    Status  New      PT LONG TERM GOAL #2   Title  She will report pain as intermittant and decr 50% or more    Time  6    Period  Weeks    Status  New      PT LONG TERM GOAL #3   Title  She will report able to garden with 50% less pain    Time  6    Period  Weeks    Status  New      PT LONG TERM GOAL #4   Title  She will report able to vaccum with 50% less pain    Time  6    Period  Weeks    Status  New            Plan - 01/09/19 3086    Clinical Impression Statement  Progressed hip and core strength today. Repeated IFC/HMP per pt request. No increased pain reported during session.    PT  Next Visit Plan  Check goals/progress; assess response to IFC/HMP, review stretch , review and progress core strength as able ,   manual and nodalities as needed    PT Home Exercise Plan  flexion stretch in sitting.   STW with tennis balls, bridge with feet on ball, upper trunk rotation, LTR with feet on ball, hamstring stretch    Consulted and Agree with Plan of Care  Patient       Patient will benefit from skilled therapeutic intervention in order to improve the following deficits and impairments:     Visit Diagnosis: 1. Chronic bilateral low back pain, unspecified whether sciatica present   2. Stiffness of right hip, not elsewhere classified   3. Muscle spasm of back   4. Abnormal posture   5. Stiffness of left knee, not elsewhere classified        Problem List Patient Active Problem List   Diagnosis Date Noted  . Unilateral primary osteoarthritis, right knee 11/21/2017  . Status post revision of total knee, left 11/21/2017  . Chronic rhinitis 02/25/2016  . Cough 02/25/2016  . Hoarseness of voice 02/25/2016  . Chronic leukopenia 12/15/2015    Dorene Ar, PTA 01/09/2019, 9:58 AM  [facility] [address] [place], [place], [address] Phone: [phone]   Fax:  [phone]  Name: AISSATA WILMORE MRN: [id number] Date of Birth: 1941-02-17

## 2019-01-10 ENCOUNTER — Ambulatory Visit (INDEPENDENT_AMBULATORY_CARE_PROVIDER_SITE_OTHER): Payer: Medicare Other | Admitting: Physician Assistant

## 2019-01-10 ENCOUNTER — Encounter: Payer: Self-pay | Admitting: Physician Assistant

## 2019-01-10 VITALS — Ht 65.0 in | Wt 208.0 lb

## 2019-01-10 DIAGNOSIS — G8929 Other chronic pain: Secondary | ICD-10-CM | POA: Diagnosis not present

## 2019-01-10 DIAGNOSIS — M545 Low back pain: Secondary | ICD-10-CM

## 2019-01-10 NOTE — Progress Notes (Signed)
Office Visit Note   Patient: [name]           Date of Birth: 13-Jun-1941           MRN: [id number] Visit Date: 01/10/2019              Requested by: Lucianne Lei, MD [employer] [place],  [place] [address] PCP: Lucianne Lei, MD  Chief Complaint  Patient presents with  . Lower Back - Pain, Follow-up      HPI: The patient is a 78 yo woman who is seen for follow up of her chronic mid low back and upper back pain.  She has been participating in a physical therapy program and reports significant improvement in her pain and mobility. She reports her pain is at most a 5/10 at times. She is pleased with the progress she has made with PT and would like to continue with the therapy program for now.  She does also note some pain over the ball of her feet. She has not been doing any achilles stretching exercises.   Assessment & Plan: Visit Diagnoses:  1. Chronic midline low back pain without sciatica     Plan: Continue course of physical therapy and she will call to extend therapy should this be indicated. Also recommended stiff soled shoes and Achilles stretching  Follow-Up Instructions: Return if symptoms worsen or fail to improve.   Ortho Exam  Patient is alert, oriented, no adenopathy, well-dressed, normal affect, normal respiratory effort. She ambulates with a single point cane . Non tender to palpation over the low back . No focal motor weakness except for quad weakness which is related to her chronic left knee pain. Good pedal pulses bilaterally. Tender over metatarsal heads over the ball of both feet to palpation. Dorsiflexion to neutral.   Imaging: No results found. No images are attached to the encounter.  Labs: No results found for: HGBA1C, ESRSEDRATE, CRP, LABURIC, REPTSTATUS, GRAMSTAIN, CULT, LABORGA   No results found for: ALBUMIN, PREALBUMIN, LABURIC  No results found for: MG No results found for: VD25OH  No results found for: PREALBUMIN CBC  EXTENDED Latest Ref Rng & Units 11/09/2005  WBC 3.9 - 10.0 10e3/uL 2.5(L)  RBC 3.70 - 5.32 10e6/uL 4.61  HGB 11.6 - 15.9 g/dL 14.1  HCT 34.8 - 46.6 % 41.8  PLT 145 - 400 10e3/uL 174  NEUTROABS 1.5 - 6.5 10e3/uL 0.8(L)  LYMPHSABS 0.9 - 3.3 10e3/uL 1.4     Body mass index is 34.61 kg/m.  Orders:  No orders of the defined types were placed in this encounter.  No orders of the defined types were placed in this encounter.    Procedures: No procedures performed  Clinical Data: No additional findings.  ROS:  All other systems negative, except as noted in the HPI. Review of Systems  Objective: Vital Signs: Ht 5\' 5"  (1.651 m)   Wt 208 lb (94.3 kg)   BMI 34.61 kg/m   Specialty Comments:  No specialty comments available.  PMFS History: Patient Active Problem List   Diagnosis Date Noted  . Unilateral primary osteoarthritis, right knee 11/21/2017  . Status post revision of total knee, left 11/21/2017  . Chronic rhinitis 02/25/2016  . Cough 02/25/2016  . Hoarseness of voice 02/25/2016  . Chronic leukopenia 12/15/2015   Past Medical History:  Diagnosis Date  . Asthma    allergic induced  . Cough   . Hypertension   . Rhinitis  Family History  Problem Relation Age of Onset  . Other Mother        died when patient was 63 months old  . Hypertension Father   . Cerebral aneurysm Sister   . Breast cancer Sister   . Lung cancer Sister   . Healthy Son   . Allergic rhinitis Neg Hx   . Angioedema Neg Hx   . Asthma Neg Hx   . Eczema Neg Hx   . Immunodeficiency Neg Hx   . Urticaria Neg Hx     Past Surgical History:  Procedure Laterality Date  . ABDOMINAL HYSTERECTOMY    . BACK SURGERY    . CATARACT EXTRACTION    . MASS EXCISION  01/05/2012   Procedure: MINOR EXCISION OF MASS;  Surgeon: Cammie Sickle., MD;  Location: [facility];  Service: Orthopedics;  Laterality: Right;  excision mucoid cyst right long finger, debride DIP joint  . ROTATOR CUFF  REPAIR Bilateral   . TOTAL KNEE ARTHROPLASTY Left    twice   Social History   Occupational History  . Occupation: retired    Comment: Warehouse manager  Tobacco Use  . Smoking status: Never Smoker  . Smokeless tobacco: Never Used  Substance and Sexual Activity  . Alcohol use: No    Alcohol/week: 0.0 standard drinks  . Drug use: No  . Sexual activity: Not on file    Comment: retired from [employer]. 1 son.

## 2019-01-15 ENCOUNTER — Other Ambulatory Visit: Payer: Self-pay

## 2019-01-15 ENCOUNTER — Encounter: Payer: Self-pay | Admitting: Physical Therapy

## 2019-01-15 ENCOUNTER — Ambulatory Visit: Payer: Medicare Other | Admitting: Physical Therapy

## 2019-01-15 DIAGNOSIS — M62830 Muscle spasm of back: Secondary | ICD-10-CM | POA: Diagnosis not present

## 2019-01-15 DIAGNOSIS — M25651 Stiffness of right hip, not elsewhere classified: Secondary | ICD-10-CM

## 2019-01-15 DIAGNOSIS — R293 Abnormal posture: Secondary | ICD-10-CM | POA: Diagnosis not present

## 2019-01-15 DIAGNOSIS — G8929 Other chronic pain: Secondary | ICD-10-CM

## 2019-01-15 DIAGNOSIS — M25662 Stiffness of left knee, not elsewhere classified: Secondary | ICD-10-CM | POA: Diagnosis not present

## 2019-01-15 DIAGNOSIS — M545 Low back pain, unspecified: Secondary | ICD-10-CM

## 2019-01-15 NOTE — Therapy (Addendum)
[place], [place], [address] Phone: [phone]   Fax:  [phone]  Physical Therapy Treatment  Patient Details  Name: [name] MRN: [id number] Date of Birth: [birth year] Referring Provider (PT): Shawn Rayburn  PA-c   Encounter Date: 01/15/2019  PT End of Session - 01/15/19 0915    Visit Number  6    Number of Visits  12    Date for PT Re-Evaluation  02/01/19    Authorization Type  MCR    PT Start Time  0900    PT Stop Time  1000    PT Time Calculation (min)  60 min       Past Medical History:  Diagnosis Date  . Asthma    allergic induced  . Cough   . Hypertension   . Rhinitis     Past Surgical History:  Procedure Laterality Date  . ABDOMINAL HYSTERECTOMY    . BACK SURGERY    . CATARACT EXTRACTION    . MASS EXCISION  01/05/2012   Procedure: MINOR EXCISION OF MASS;  Surgeon: Cammie Sickle., MD;  Location: [facility];  Service: Orthopedics;  Laterality: Right;  excision mucoid cyst right long finger, debride DIP joint  . ROTATOR CUFF REPAIR Bilateral   . TOTAL KNEE ARTHROPLASTY Left    twice    There were no vitals filed for this visit.  Subjective Assessment - 01/15/19 0933    Subjective  I am doing much better. Saw MD and she recommended continued PT and calf stretches for my foot pain.    Currently in Pain?  Yes    Pain Score  3     Pain Location  Back    Pain Orientation  Lower    Pain Descriptors / Indicators  Aching    Aggravating Factors   vacumming    Pain Relieving Factors  hot shower, PT                       [facility] Adult PT Treatment/Exercise - 01/15/19 0001      Lumbar Exercises: Stretches   Active Hamstring Stretch  3 reps;30 seconds    Active Hamstring Stretch Limitations  supine    Lower Trunk Rotation  5 reps;10 seconds   with LEs over ball    Lower Trunk Rotation Limitations  open books from side lying with tactile assist  for LEs     Pelvic Tilt  15 reps    Pelvic Tilt Limitations  legs over physioball    Other Lumbar Stretch Exercise  seated flexion stretch per initial HEP       Lumbar Exercises: Standing   Other Standing Lumbar Exercises  standing 3 way hip x 10 each  bilateral     Other Standing Lumbar Exercises  mini squats x 10  at counter with glut squeeze       Lumbar Exercises: Supine   Bent Knee Raise  10 reps    Bent Knee Raise Limitations  alt. lift from bolster with abdominal draw in , added opp arm reach x 10     Bridge with [employer]  15 reps    Bridge with [employer] Limitations  with feet over tall bolster      Lumbar Exercises: Sidelying   Hip Abduction  Right;Left;20 reps    Hip Abduction Limitations  10 reps abduction from flexion, then glut med abduction x 10  Electrical Stim IFC to Lumbar x 15 MINUTES at 28m Moist Heat x 15 minutes.        PT Education - 01/15/19 0933    Education Details  HEP, TENS    Person(s) Educated  Patient    Methods  Explanation;Handout    Comprehension  Verbalized understanding       PT Short Term Goals - 01/15/19 0906      PT SHORT TERM GOAL #1   Title  She will be indpendent withj intial HEp    Time  3    Period  Weeks    Status  Achieved      PT SHORT TERM GOAL #2   Title  She will report pain dcr 20% or more    Time  3    Period  Weeks    Status  Achieved        PT Long Term Goals - 01/15/19 0906      PT LONG TERM GOAL #1   Title  She will be indpendent with all HEp issued    Time  6    Period  Weeks    Status  On-going      PT LONG TERM GOAL #2   Title  She will report pain as intermittant and decr 50% or more    Time  6    Period  Weeks    Status  Achieved      PT LONG TERM GOAL #3   Title  She will report able to garden with 50% less pain    Baseline  45% improved    Time  6    Period  Weeks    Status  On-going      PT LONG TERM GOAL #4   Title  She will report able to vaccum with 50% less pain     Baseline  30% improved    Time  6    Period  Weeks    Status  On-going            Plan - 01/15/19 0901    Clinical Impression Statement  60-70% improvement per patient in low back and mid back pain. She has returned to some gardening , pulling weeds and tending flowers and rates 45% decreased pain with gardening. She reports 30% decrease in pain with vacumming. She must stop vacumming due to pain in low back. All STGs met. LTG# 2 met. LTG# 3 nearly met. She brought her physioball into clinic today and I inflated it for her. This will help at home to allow her to perform supine core and lumbar stretches despite her knee ROM limitations. Progressed to closed chain hip strength. Began gastroc stretching per MD recommnedation. No increased pain with progression today. Also gave info on TENS unit for home.    PT Next Visit Plan  Check goals/progress; assess response to IFC/HMP, review stretch , review and progress core strength as able ,   manual and nodalities as needed    PT Home Exercise Plan  flexion stretch in sitting.   STW with tennis balls, bridge with feet on ball, upper trunk rotation, LTR with feet on ball, hamstring stretch, gastroc stretch, standing 3 way hip       Patient will benefit from skilled therapeutic intervention in order to improve the following deficits and impairments:  Pain, Postural dysfunction, Increased muscle spasms, Difficulty walking, Decreased activity tolerance, Decreased range of motion  Visit Diagnosis: 1. Chronic bilateral low back pain,  unspecified whether sciatica present   2. Stiffness of right hip, not elsewhere classified   3. Muscle spasm of back   4. Abnormal posture   5. Stiffness of left knee, not elsewhere classified        Problem List Patient Active Problem List   Diagnosis Date Noted  . Unilateral primary osteoarthritis, right knee 11/21/2017  . Status post revision of total knee, left 11/21/2017  . Chronic rhinitis 02/25/2016  .  Cough 02/25/2016  . Hoarseness of voice 02/25/2016  . Chronic leukopenia 12/15/2015    Dorene Ar, PTA 01/15/2019, 9:51 AM  [facility] [address] [place], [place], [address] Phone: [phone]   Fax:  [phone]  Name: [name] MRN: [id number] Date of Birth: [birth year]

## 2019-01-15 NOTE — Patient Instructions (Addendum)

## 2019-01-17 ENCOUNTER — Encounter: Payer: Self-pay | Admitting: Physical Therapy

## 2019-01-17 ENCOUNTER — Other Ambulatory Visit: Payer: Self-pay

## 2019-01-17 ENCOUNTER — Ambulatory Visit: Payer: Medicare Other | Admitting: Physical Therapy

## 2019-01-17 DIAGNOSIS — M25662 Stiffness of left knee, not elsewhere classified: Secondary | ICD-10-CM

## 2019-01-17 DIAGNOSIS — M545 Low back pain, unspecified: Secondary | ICD-10-CM

## 2019-01-17 DIAGNOSIS — R293 Abnormal posture: Secondary | ICD-10-CM | POA: Diagnosis not present

## 2019-01-17 DIAGNOSIS — G8929 Other chronic pain: Secondary | ICD-10-CM

## 2019-01-17 DIAGNOSIS — M25651 Stiffness of right hip, not elsewhere classified: Secondary | ICD-10-CM

## 2019-01-17 DIAGNOSIS — M62830 Muscle spasm of back: Secondary | ICD-10-CM

## 2019-01-17 NOTE — Therapy (Signed)
[place], [place], [address] Phone: [phone]   Fax:  [phone]  Physical Therapy Treatment  Patient Details  Name: [name] MRN: [id number] Date of Birth: 06-21-1940 Referring Provider (PT): Shawn Rayburn  PA-c   Encounter Date: 01/17/2019  PT End of Session - 01/17/19 0928    Visit Number  7    Number of Visits  12    Date for PT Re-Evaluation  02/01/19    Authorization Type  MCR    PT Start Time  0932    PT Stop Time  1030    PT Time Calculation (min)  58 min       Past Medical History:  Diagnosis Date  . Asthma    allergic induced  . Cough   . Hypertension   . Rhinitis     Past Surgical History:  Procedure Laterality Date  . ABDOMINAL HYSTERECTOMY    . BACK SURGERY    . CATARACT EXTRACTION    . MASS EXCISION  01/05/2012   Procedure: MINOR EXCISION OF MASS;  Surgeon: Cammie Sickle., MD;  Location: [facility];  Service: Orthopedics;  Laterality: Right;  excision mucoid cyst right long finger, debride DIP joint  . ROTATOR CUFF REPAIR Bilateral   . TOTAL KNEE ARTHROPLASTY Left    twice    There were no vitals filed for this visit.  Subjective Assessment - 01/17/19 0946    Subjective  Felt fine ater last session. The calf stretches are helping my feet a little.    Currently in Pain?  Yes    Pain Score  3     Pain Location  Back    Pain Orientation  Lower    Pain Descriptors / Indicators  Aching    Pain Type  Chronic pain                       [facility] Adult PT Treatment/Exercise - 01/17/19 0001      Lumbar Exercises: Stretches   Active Hamstring Stretch  --    Active Hamstring Stretch Limitations  --    Lower Trunk Rotation Limitations  open books from side lying with tactile assist for LEs     Gastroc Stretch  3 reps;30 seconds    Gastroc Stretch Limitations  runners stretch     Other Lumbar Stretch Exercise  seated flexion stretch  per initial HEP       Lumbar Exercises: Standing   Heel Raises  10 reps    Row  20 reps    Theraband Level (Row)  Level 3 (Green)    Other Standing Lumbar Exercises  standing 3 way hip x 15 each  bilateral     Other Standing Lumbar Exercises  mini squats x 15  at counter with glut squeeze       Lumbar Exercises: Supine   Bent Knee Raise  10 reps    Bent Knee Raise Limitations  alt. LE Lifts  with abdominal draw in , added opp arm reach x 10       Lumbar Exercises: Sidelying   Hip Abduction  Right;Left;15 reps      Moist Heat Therapy   Number Minutes Moist Heat  15 Minutes    Moist Heat Location  Lumbar Spine      Electrical Stimulation   Electrical Stimulation Location  Lumbar    Electrical Stimulation Action  IFC x 15 min  Electrical Stimulation Parameters  12 mA    Electrical Stimulation Goals  Pain               PT Short Term Goals - 01/15/19 0906      PT SHORT TERM GOAL #1   Title  She will be indpendent withj intial HEp    Time  3    Period  Weeks    Status  Achieved      PT SHORT TERM GOAL #2   Title  She will report pain dcr 20% or more    Time  3    Period  Weeks    Status  Achieved        PT Long Term Goals - 01/15/19 0630      PT LONG TERM GOAL #1   Title  She will be indpendent with all HEp issued    Time  6    Period  Weeks    Status  On-going      PT LONG TERM GOAL #2   Title  She will report pain as intermittant and decr 50% or more    Time  6    Period  Weeks    Status  Achieved      PT LONG TERM GOAL #3   Title  She will report able to garden with 50% less pain    Baseline  45% improved    Time  6    Period  Weeks    Status  On-going      PT LONG TERM GOAL #4   Title  She will report able to vaccum with 50% less pain    Baseline  30% improved    Time  6    Period  Weeks    Status  On-going            Plan - 01/17/19 1601    Clinical Impression Statement  Continued with closed chain hip strengthening. Added  standing postural bands. Repeated modality for pain. Pt reports she does have an old TENS unit and she may bring it in for review on how to use it.    PT Next Visit Plan  re-check goals/ DC verses ERO discussion with primary PT next week./ , review and progress core strength as able ,   manual and nodalities as needed (pt may bring in her TENS for review)    PT Home Exercise Plan  flexion stretch in sitting.   STW with tennis balls, bridge with feet on ball, upper trunk rotation, LTR with feet on ball, hamstring stretch, gastroc stretch, standing 3 way hip       Patient will benefit from skilled therapeutic intervention in order to improve the following deficits and impairments:  Pain, Postural dysfunction, Increased muscle spasms, Difficulty walking, Decreased activity tolerance, Decreased range of motion  Visit Diagnosis: 1. Chronic bilateral low back pain, unspecified whether sciatica present   2. Stiffness of right hip, not elsewhere classified   3. Muscle spasm of back   4. Abnormal posture   5. Stiffness of left knee, not elsewhere classified        Problem List Patient Active Problem List   Diagnosis Date Noted  . Unilateral primary osteoarthritis, right knee 11/21/2017  . Status post revision of total knee, left 11/21/2017  . Chronic rhinitis 02/25/2016  . Cough 02/25/2016  . Hoarseness of voice 02/25/2016  . Chronic leukopenia 12/15/2015    Dorene Ar, PTA 01/17/2019, 10:31 AM  [place], [place], [address] Phone: [phone]   Fax:  [phone]  Name: CASON DABNEY MRN: [id number] Date of Birth: [birth year]

## 2019-01-22 ENCOUNTER — Other Ambulatory Visit: Payer: Self-pay

## 2019-01-22 ENCOUNTER — Ambulatory Visit: Payer: Medicare Other | Attending: Physician Assistant

## 2019-01-22 DIAGNOSIS — M545 Low back pain, unspecified: Secondary | ICD-10-CM

## 2019-01-22 DIAGNOSIS — M62830 Muscle spasm of back: Secondary | ICD-10-CM | POA: Diagnosis not present

## 2019-01-22 DIAGNOSIS — R293 Abnormal posture: Secondary | ICD-10-CM | POA: Diagnosis not present

## 2019-01-22 DIAGNOSIS — G8929 Other chronic pain: Secondary | ICD-10-CM | POA: Diagnosis not present

## 2019-01-22 DIAGNOSIS — M25662 Stiffness of left knee, not elsewhere classified: Secondary | ICD-10-CM

## 2019-01-22 DIAGNOSIS — M25651 Stiffness of right hip, not elsewhere classified: Secondary | ICD-10-CM

## 2019-01-22 NOTE — Therapy (Signed)
[place], [place], [address] Phone: [phone]   Fax:  [phone]  Physical Therapy Treatment/ discharge  Patient Details  Name: [name] MRN: [id number] Date of Birth: 04/27/1941 Referring Provider (PT): Shawn Rayburn  PA-c   Encounter Date: 01/22/2019  PT End of Session - 01/22/19 0917    Visit Number  8    Number of Visits  12    Date for PT Re-Evaluation  02/01/19    Authorization Type  MCR    PT Start Time  0915    PT Stop Time  1000    PT Time Calculation (min)  45 min    Activity Tolerance  Patient tolerated treatment well    Behavior During Therapy  [facility] for tasks assessed/performed       Past Medical History:  Diagnosis Date  . Asthma    allergic induced  . Cough   . Hypertension   . Rhinitis     Past Surgical History:  Procedure Laterality Date  . ABDOMINAL HYSTERECTOMY    . BACK SURGERY    . CATARACT EXTRACTION    . MASS EXCISION  01/05/2012   Procedure: MINOR EXCISION OF MASS;  Surgeon: Cammie Sickle., MD;  Location: [facility];  Service: Orthopedics;  Laterality: Right;  excision mucoid cyst right long finger, debride DIP joint  . ROTATOR CUFF REPAIR Bilateral   . TOTAL KNEE ARTHROPLASTY Left    twice    There were no vitals filed for this visit.  Subjective Assessment - 01/22/19 0924    Subjective  I can feel a big difference with 85% less pain generally and flare are manageable.         [facility] PT Assessment - 01/22/19 0001      Assessment   Medical Diagnosis  chronic lower back pain    Referring Provider (PT)  Shawn Rayburn  PA-c      Posture/Postural Control   Posture Comments  rounded shoulder  and incr T/S kyphosis, Decr LT knee flexion, flat lower back sacrum Rot LT                    [facility] Adult PT Treatment/Exercise - 01/22/19 0001      Lumbar Exercises: Standing   Other Standing Lumbar Exercises  standing 3 way hip  x 15 each  bilateral       Lumbar Exercises: Supine   Bent Knee Raise  10 reps    Bent Knee Raise Limitations  alt. LE Lifts  with abdominal draw in , added opp arm reach x 10     Bridge with [employer]  15 reps    Bridge with [employer] Limitations  with feet over tall bolster    Other Supine Lumbar Exercises  Plates abdominal prep with arm pumps for HEp and ball squeeze      Lumbar Exercises: Sidelying   Hip Abduction  Right;Left;10 reps             PT Education - 01/22/19 1008    Education Details  HEP    Person(s) Educated  Patient    Methods  Explanation;Demonstration;Verbal cues;Handout    Comprehension  Returned demonstration;Verbalized understanding       PT Short Term Goals - 01/22/19 0921      PT SHORT TERM GOAL #1   Title  She will be indpendent withj intial HEp    Status  Achieved  PT SHORT TERM GOAL #2   Title  She will report pain dcr 20% or more    Status  Achieved        PT Long Term Goals - 01/22/19 0920      PT LONG TERM GOAL #1   Title  Independent wll HEP issued    Status  Achieved      PT LONG TERM GOAL #2   Title  She will report pain as intermittant and decr 50% or more    Baseline  Pain not intermittant but de cr 85%    Status  Achieved      PT LONG TERM GOAL #3   Title  She will report able to garden with 50% less pain    Baseline  60% better    Status  Achieved      PT LONG TERM GOAL #4   Title  She will report able to vaccum with 50% less pain    Baseline  not doing much nof this as still painful    Status  On-going            Plan - 01/22/19 0917    Clinical Impression Statement  She is much improved and ready for discharge. She is able to demo exercises  without pain today . Cautioned her to beck off if in pain and to be consistent with program. She agreed    PT Treatment/Interventions  Electrical Stimulation;Moist Heat;Therapeutic exercise;Patient/family education;Manual techniques;Taping;Passive range of  motion    PT Next Visit Plan  Discharge with HEP    PT Home Exercise Plan  flexion stretch in sitting.   STW with tennis balls, bridge with feet on ball, upper trunk rotation, LTR with feet on ball, hamstring stretch, gastroc stretch, standing 3 way hip    Consulted and Agree with Plan of Care  Patient       Patient will benefit from skilled therapeutic intervention in order to improve the following deficits and impairments:  Pain, Postural dysfunction, Increased muscle spasms, Difficulty walking, Decreased activity tolerance, Decreased range of motion  Visit Diagnosis: 1. Chronic bilateral low back pain, unspecified whether sciatica present   2. Stiffness of right hip, not elsewhere classified   3. Muscle spasm of back   4. Abnormal posture   5. Stiffness of left knee, not elsewhere classified        Problem List Patient Active Problem List   Diagnosis Date Noted  . Unilateral primary osteoarthritis, right knee 11/21/2017  . Status post revision of total knee, left 11/21/2017  . Chronic rhinitis 02/25/2016  . Cough 02/25/2016  . Hoarseness of voice 02/25/2016  . Chronic leukopenia 12/15/2015    Darrel Hoover  PT 01/22/2019, 10:09 AM  [facility] [address] [place], [place], [address] Phone: [phone]   Fax:  [phone]  Name: [name] MRN: [id number] Date of Birth: 08-25-1940  PHYSICAL THERAPY DISCHARGE SUMMARY  Visits from Start of Care: 8  Current functional level related to goals / functional outcomes: See above   Remaining deficits: See above   Education / Equipment: HEP Plan: Patient agrees to discharge.  Patient goals were met. Patient is being discharged due to being pleased with the current functional level.  ?????

## 2019-01-22 NOTE — Patient Instructions (Signed)
Pilates abdominal prep  With ball squeeze and arm pumps  5-10 reps 1x/day

## 2019-01-24 ENCOUNTER — Ambulatory Visit: Payer: Medicare Other

## 2019-01-30 DIAGNOSIS — G894 Chronic pain syndrome: Secondary | ICD-10-CM | POA: Diagnosis not present

## 2019-01-30 DIAGNOSIS — M25562 Pain in left knee: Secondary | ICD-10-CM | POA: Diagnosis not present

## 2019-01-30 DIAGNOSIS — M545 Low back pain: Secondary | ICD-10-CM | POA: Diagnosis not present

## 2019-01-30 DIAGNOSIS — M25561 Pain in right knee: Secondary | ICD-10-CM | POA: Diagnosis not present

## 2019-01-31 NOTE — Progress Notes (Signed)
Virtual Visit via Telephone Note The purpose of this virtual visit is to provide medical care while limiting exposure to the novel coronavirus.    Consent was obtained for phone visit:  Yes.   Answered questions that patient had about telehealth interaction:  Yes.   I discussed the limitations, risks, security and privacy concerns of performing an evaluation and management service by telephone. I also discussed with the patient that there may be a patient responsible charge related to this service. The patient expressed understanding and agreed to proceed.  Pt location: Home Physician Location: home Name of referring provider:  Lucianne Lei, MD I connected with .Inocencio Homes at patients initiation/request on 02/01/2019 at  2:30 PM EDT by telephone and verified that I am speaking with the correct person using two identifiers.  Pt MRN:  [id number] Pt DOB:  August 19, 1940   History of Present Illness:  Patient seen today in follow-up for essential tremor.  Her primidone was increased last visit 50 mg twice per day.  She reports that her tremor is still bothersome "at times."  She states that 80% of the time, it is bothersome, esp with writing.  She is not having any SE with the medications.   Observations/Objective:   Vitals:   02/01/19 0855  Weight: 198 lb (89.8 kg)  Height: 5\' 5"  (1.651 m)    Current Outpatient Medications:  .  Ascorbic Acid (VITAMIN C) 250 MG CHEW, Chew 250 mg by mouth., Disp: , Rfl:  .  azelastine (ASTELIN) 0.1 % nasal spray, Place 2 sprays into both nostrils 2 (two) times daily., Disp: 90 mL, Rfl: 1 .  beclomethasone (QVAR) 80 MCG/ACT inhaler, Inhale into the lungs., Disp: , Rfl:  .  betamethasone dipropionate (DIPROLENE) 0.05 % ointment, , Disp: , Rfl:  .  budesonide-formoterol (SYMBICORT) 160-4.5 MCG/ACT inhaler, Inhale 2 puffs into the lungs 2 (two) times daily., Disp: 1 Inhaler, Rfl: 5 .  CALCIUM-MAGNESIUM-ZINC PO, Take by mouth., Disp: , Rfl:  .   Carbinoxamine Maleate (RYVENT) 6 MG TABS, Take 1 tablet by mouth every 8 (eight) hours., Disp: 120 tablet, Rfl: 3 .  cholecalciferol (VITAMIN D3) 25 MCG (1000 UT) tablet, Take 1,000 Units by mouth daily., Disp: , Rfl:  .  clindamycin (CLEOCIN-T) 1 % lotion, , Disp: , Rfl:  .  clobetasol cream (TEMOVATE) 0.05 %, , Disp: , Rfl:  .  Colesevelam HCl (WELCHOL) 3.75 g PACK, , Disp: , Rfl:  .  Diclofenac Sodium (PENNSAID) 1.5 % SOLN, Place onto the skin. Reported on 08/26/2015, Disp: , Rfl:  .  docusate sodium (COLACE) 100 MG capsule, Take 100 mg by mouth daily as needed for mild constipation., Disp: , Rfl:  .  montelukast (SINGULAIR) 10 MG tablet, Take 1 tablet (10 mg total) by mouth at bedtime., Disp: 90 tablet, Rfl: 1 .  multivitamin-iron-minerals-folic acid (CENTRUM) chewable tablet, Chew 1 tablet by mouth daily., Disp: , Rfl:  .  niacin (NIASPAN) 500 MG CR tablet, Take 500 mg by mouth at bedtime., Disp: , Rfl:  .  olmesartan-hydrochlorothiazide (BENICAR HCT) 20-12.5 MG per tablet, Take 1 tablet by mouth daily., Disp: , Rfl:  .  Pitavastatin Calcium (LIVALO) 4 MG TABS, Take by mouth., Disp: , Rfl:  .  primidone (MYSOLINE) 50 MG tablet, Take 1 tablet (50 mg total) by mouth 2 (two) times daily., Disp: 180 tablet, Rfl: 1 .  traMADol (ULTRAM) 50 MG tablet, Take 1 tablet (50 mg total) by mouth every 6 (six) hours as  needed. Maximum dose= 8 tablets per day, Disp: 10 tablet, Rfl: 0    Assessment and Plan:   1.  Essential tremor  -Increase Primidone, 50 mg, 2 tablets in the morning and 1 at night.  Discussed risk, benefits, and side effects.  -Discussed that there are many medications for her lungs could certainly play a role in increasing tremor.  2.  Chronic leukopenia  -Follows with hematology.  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  She has had an extensive work-up, including bone marrow aspirate and biopsy.  Follow Up Instructions: 6  months   -I discussed the assessment and treatment plan with the patient. The patient was provided an opportunity to ask questions and all were answered. The patient agreed with the plan and demonstrated an understanding of the instructions.   The patient was advised to call back or seek an in-person evaluation if the symptoms worsen or if the condition fails to improve as anticipated.    Total Time spent in visit with the patient was:  7 min, of which 100% of the time was spent in counseling.   Pt understands and agrees with the plan of care outlined.     Alonza Bogus, DO

## 2019-02-01 ENCOUNTER — Other Ambulatory Visit: Payer: Self-pay

## 2019-02-01 ENCOUNTER — Telehealth (INDEPENDENT_AMBULATORY_CARE_PROVIDER_SITE_OTHER): Payer: Medicare Other | Admitting: Neurology

## 2019-02-01 ENCOUNTER — Encounter: Payer: Self-pay | Admitting: Neurology

## 2019-02-01 VITALS — Ht 65.0 in | Wt 198.0 lb

## 2019-02-01 DIAGNOSIS — G250 Essential tremor: Secondary | ICD-10-CM | POA: Diagnosis not present

## 2019-02-01 MED ORDER — PRIMIDONE 50 MG PO TABS: ORAL_TABLET | ORAL | 1 refills | Status: DC

## 2019-02-15 DIAGNOSIS — Z23 Encounter for immunization: Secondary | ICD-10-CM | POA: Diagnosis not present

## 2019-02-26 DIAGNOSIS — E782 Mixed hyperlipidemia: Secondary | ICD-10-CM | POA: Diagnosis not present

## 2019-02-26 DIAGNOSIS — I10 Essential (primary) hypertension: Secondary | ICD-10-CM | POA: Diagnosis not present

## 2019-02-26 DIAGNOSIS — M130 Polyarthritis, unspecified: Secondary | ICD-10-CM | POA: Diagnosis not present

## 2019-02-26 DIAGNOSIS — E1169 Type 2 diabetes mellitus with other specified complication: Secondary | ICD-10-CM | POA: Diagnosis not present

## 2019-02-27 DIAGNOSIS — E1100 Type 2 diabetes mellitus with hyperosmolarity without nonketotic hyperglycemic-hyperosmolar coma (NKHHC): Secondary | ICD-10-CM | POA: Diagnosis not present

## 2019-02-27 DIAGNOSIS — I10 Essential (primary) hypertension: Secondary | ICD-10-CM | POA: Diagnosis not present

## 2019-03-07 DIAGNOSIS — I10 Essential (primary) hypertension: Secondary | ICD-10-CM | POA: Diagnosis not present

## 2019-03-07 DIAGNOSIS — B358 Other dermatophytoses: Secondary | ICD-10-CM | POA: Diagnosis not present

## 2019-03-07 DIAGNOSIS — R251 Tremor, unspecified: Secondary | ICD-10-CM | POA: Diagnosis not present

## 2019-03-13 ENCOUNTER — Other Ambulatory Visit: Payer: Self-pay

## 2019-03-13 NOTE — Telephone Encounter (Signed)
Requested Prescriptions   Pending Prescriptions Disp Refills  . primidone (MYSOLINE) 50 MG tablet 270 tablet 1    Sig: 2 in the AM, 1 at night   Rx last filled: 02/01/19 #270 1 refills  Pt last seen: 02/01/19  Follow up appt scheduled: 08/08/2019  Denied request to soon

## 2019-03-20 ENCOUNTER — Encounter: Payer: Self-pay | Admitting: Allergy

## 2019-03-20 ENCOUNTER — Other Ambulatory Visit: Payer: Self-pay

## 2019-03-20 ENCOUNTER — Ambulatory Visit (INDEPENDENT_AMBULATORY_CARE_PROVIDER_SITE_OTHER): Payer: Medicare Other | Admitting: Allergy

## 2019-03-20 VITALS — BP 130/78 | HR 80 | Temp 97.8°F | Resp 18 | Ht 65.0 in

## 2019-03-20 DIAGNOSIS — R05 Cough: Secondary | ICD-10-CM | POA: Diagnosis not present

## 2019-03-20 DIAGNOSIS — J310 Chronic rhinitis: Secondary | ICD-10-CM | POA: Diagnosis not present

## 2019-03-20 DIAGNOSIS — R059 Cough, unspecified: Secondary | ICD-10-CM

## 2019-03-20 MED ORDER — CARBINOXAMINE MALEATE 6 MG PO TABS: 1.0000 | ORAL_TABLET | Freq: Three times a day (TID) | ORAL | 5 refills | Status: DC

## 2019-03-20 NOTE — Progress Notes (Signed)
Follow-up Note  RE: [name] MRN: [id number] DOB: October 01, 1940 Date of Office Visit: 03/20/2019   History of present illness: [name] is a 78 y.o. female presenting today for follow-up of cough with rhinitis.  She was last seen in the office on Nov 14, 2018 by myself.  She states she has been doing well since her last visit and feel that her cough and rhinitis is doing better.  She states the increase to Symbicort 160 mcg has been very helpful in controlling her cough better.  She states she is using her albuterol less which is about 2 times a week mostly in the morning.  She states the RyVent is working well for her at this time.  She also continues on Singulair.  She is using Astelin and states that this helps greatly with her nasal drainage.  She is pleased with this current regimen.  Review of systems: Review of Systems  Constitutional: Negative for chills, fever and malaise/fatigue.  HENT: Negative for congestion, ear discharge, nosebleeds and sore throat.   Eyes: Negative for pain, discharge and redness.  Respiratory: Positive for cough. Negative for shortness of breath and wheezing.   Cardiovascular: Negative for chest pain.  Gastrointestinal: Negative for abdominal pain, constipation, diarrhea, heartburn, nausea and vomiting.  Musculoskeletal: Negative for joint pain.  Skin: Negative for itching and rash.  Neurological: Negative for headaches.    All other systems negative unless noted above in HPI  Past medical/social/surgical/family history have been reviewed and are unchanged unless specifically indicated below.  No changes  Medication List: Allergies as of 03/20/2019      Reactions   Latex Shortness Of Breath      Medication List       Accurate as of March 20, 2019  5:18 PM. If you have any questions, ask your nurse or doctor.        STOP taking these medications   beclomethasone 80 MCG/ACT inhaler Commonly known as: QVAR Stopped by:  Dilana Mcphie Charmian Muff, MD     TAKE these medications   azelastine 0.1 % nasal spray Commonly known as: ASTELIN Place 2 sprays into both nostrils 2 (two) times daily.   betamethasone dipropionate 0.05 % ointment Commonly known as: DIPROLENE   budesonide-formoterol 160-4.5 MCG/ACT inhaler Commonly known as: Symbicort Inhale 2 puffs into the lungs 2 (two) times daily.   CALCIUM-MAGNESIUM-ZINC PO Take by mouth.   Carbinoxamine Maleate 6 MG Tabs Commonly known as: RyVent Take 1 tablet by mouth every 8 (eight) hours.   cholecalciferol 25 MCG (1000 UT) tablet Commonly known as: VITAMIN D3 Take 1,000 Units by mouth daily.   Cleocin-T 1 % lotion Generic drug: clindamycin   clobetasol cream 0.05 % Commonly known as: TEMOVATE   clotrimazole-betamethasone cream Commonly known as: LOTRISONE   docusate sodium 100 MG capsule Commonly known as: COLACE Take 100 mg by mouth daily as needed for mild constipation.   Livalo 4 MG Tabs Generic drug: Pitavastatin Calcium Take by mouth.   montelukast 10 MG tablet Commonly known as: SINGULAIR Take 1 tablet (10 mg total) by mouth at bedtime.   multivitamin-iron-minerals-folic acid chewable tablet Chew 1 tablet by mouth daily.   niacin 500 MG CR tablet Commonly known as: NIASPAN Take 500 mg by mouth at bedtime.   olmesartan-hydrochlorothiazide 20-12.5 MG tablet Commonly known as: BENICAR HCT Take 1 tablet by mouth daily.   diclofenac sodium 1 % Gel Commonly known as: VOLTAREN   Pennsaid 1.5 % Soln Generic  drug: Diclofenac Sodium Place onto the skin. Reported on 08/26/2015   primidone 50 MG tablet Commonly known as: MYSOLINE 2 in the AM, 1 at night   traMADol 50 MG tablet Commonly known as: Ultram Take 1 tablet (50 mg total) by mouth every 6 (six) hours as needed. Maximum dose= 8 tablets per day   Vitamin C 250 MG Chew Chew 250 mg by mouth.   Welchol 3.75 g Pack Generic drug: Colesevelam HCl       Known  medication allergies: Allergies  Allergen Reactions  . Latex Shortness Of Breath     Physical examination: Blood pressure 130/78, pulse 80, temperature 97.8 F (36.6 C), temperature source Temporal, resp. rate 18, height 5\' 5"  (1.651 m), SpO2 95 %.  General: Alert, interactive, in no acute distress. HEENT: PERRLA, TMs pearly gray, turbinates minimally edematous without discharge, post-pharynx non erythematous. Neck: Supple without lymphadenopathy. Lungs: Clear to auscultation without wheezing, rhonchi or rales. {no increased work of breathing. CV: Normal S1, S2 without murmurs. Abdomen: Nondistended, nontender. Skin: Warm and dry, without lesions or rashes. Extremities:  No clubbing, cyanosis or edema. Neuro:   Grossly intact.  Diagnositics/Labs:  Spirometry: FEV1: 2.06L 117%, FVC: 2.78L 122%, ratio consistent with nonobstructive pattern   Assessment and plan:   Cough and rhinitis  - post nasal drip leading to throat irritation and cough - improved  - continue nasal antihistamine Astelin 2 sprays each nostril 1-2 times a day for nasal drainage.    - continue use of saline wash prior to using your nasal spray  - continue singulair 10mg  at bedtime  - use Xopenex 2 inhalations every 4-6 hours as needed for cough/wheeze/shortness of breath/chest tightness.  This is a quick relief medication.     - continue Symbicort 118mcg 2 puffs twice a day   - continue RyVent 6mg  and may take up to twice a day to help with nasal drainage.    Follow-up 4-6 months or sooner if needed   I appreciate the opportunity to take part in [name]'s care. Please do not hesitate to contact me with questions.  Sincerely,   Prudy Feeler, MD Allergy/Immunology [facility]

## 2019-03-20 NOTE — Patient Instructions (Addendum)
Cough and rhinitis  - post nasal drip leading to throat irritation and cough - improved  - continue nasal antihistamine Astelin 2 sprays each nostril 1-2 times a day for nasal drainage.    - continue use of saline wash prior to using your nasal spray  - continue singulair 10mg  at bedtime  - use Xopenex 2 inhalations every 4-6 hours as needed for cough/wheeze/shortness of breath/chest tightness.  This is a quick relief medication.     - continue Symbicort 159mcg 2 puffs twice a day   - continue RyVent 6mg  and may take up to twice a day to help with nasal drainage.    Follow-up 4-6 months or sooner if needed

## 2019-04-02 ENCOUNTER — Other Ambulatory Visit: Payer: Self-pay | Admitting: *Deleted

## 2019-04-02 MED ORDER — MONTELUKAST SODIUM 10 MG PO TABS: 10.0000 mg | ORAL_TABLET | Freq: Every day | ORAL | 1 refills | Status: DC

## 2019-04-11 DIAGNOSIS — L989 Disorder of the skin and subcutaneous tissue, unspecified: Secondary | ICD-10-CM | POA: Diagnosis not present

## 2019-04-18 DIAGNOSIS — L818 Other specified disorders of pigmentation: Secondary | ICD-10-CM | POA: Diagnosis not present

## 2019-04-18 DIAGNOSIS — L308 Other specified dermatitis: Secondary | ICD-10-CM | POA: Diagnosis not present

## 2019-04-18 DIAGNOSIS — L130 Dermatitis herpetiformis: Secondary | ICD-10-CM | POA: Diagnosis not present

## 2019-04-18 DIAGNOSIS — L810 Postinflammatory hyperpigmentation: Secondary | ICD-10-CM | POA: Diagnosis not present

## 2019-04-29 ENCOUNTER — Telehealth: Payer: Self-pay | Admitting: Neurology

## 2019-04-29 NOTE — Telephone Encounter (Signed)
Patient called regarding her medication Primidone. She said she started noticing back in September a rash/ Blisters coming up. She said that she went to see her PCP and a Dermatologist. The Dermatologist told her that it was caused by her medication. She recommended her not take as much. She would like a call back regarding what to do regarding the medication. She goes back to her PCP tomorrow so she said she will be available tomorrow afternoon. Please Call [phone]. Thanks

## 2019-04-30 NOTE — Telephone Encounter (Signed)
Left message to call office back

## 2019-04-30 NOTE — Telephone Encounter (Signed)
Patient left msg returning your call

## 2019-04-30 NOTE — Telephone Encounter (Signed)
Patient left message on Voicemail returning a call to the office. Thanks

## 2019-04-30 NOTE — Telephone Encounter (Signed)
Please advise on below  

## 2019-04-30 NOTE — Telephone Encounter (Signed)
She was started on primidone in 10/2017.  Did she tell the dermatologist that?  It would be unusual for her to develop an allergy now AND if she had an allergy to it, taking "not as much" (as opposed to not at all) would not be the solution.  Is she sure that she understood correctly?

## 2019-05-01 NOTE — Telephone Encounter (Signed)
Pt is aware and is going back to see dermatologist tomorrow 05/02/2019 and will discuss this with that doctor

## 2019-05-02 DIAGNOSIS — L308 Other specified dermatitis: Secondary | ICD-10-CM | POA: Diagnosis not present

## 2019-05-02 DIAGNOSIS — L309 Dermatitis, unspecified: Secondary | ICD-10-CM | POA: Diagnosis not present

## 2019-05-08 DIAGNOSIS — L989 Disorder of the skin and subcutaneous tissue, unspecified: Secondary | ICD-10-CM | POA: Diagnosis not present

## 2019-05-08 DIAGNOSIS — R21 Rash and other nonspecific skin eruption: Secondary | ICD-10-CM | POA: Diagnosis not present

## 2019-05-23 DIAGNOSIS — L308 Other specified dermatitis: Secondary | ICD-10-CM | POA: Diagnosis not present

## 2019-05-30 DIAGNOSIS — L308 Other specified dermatitis: Secondary | ICD-10-CM | POA: Diagnosis not present

## 2019-05-30 DIAGNOSIS — L810 Postinflammatory hyperpigmentation: Secondary | ICD-10-CM | POA: Diagnosis not present

## 2019-07-10 DIAGNOSIS — D704 Cyclic neutropenia: Secondary | ICD-10-CM | POA: Diagnosis not present

## 2019-07-10 DIAGNOSIS — D72819 Decreased white blood cell count, unspecified: Secondary | ICD-10-CM | POA: Diagnosis not present

## 2019-07-10 DIAGNOSIS — L308 Other specified dermatitis: Secondary | ICD-10-CM | POA: Diagnosis not present

## 2019-07-10 DIAGNOSIS — R21 Rash and other nonspecific skin eruption: Secondary | ICD-10-CM | POA: Diagnosis not present

## 2019-07-11 DIAGNOSIS — H6123 Impacted cerumen, bilateral: Secondary | ICD-10-CM | POA: Diagnosis not present

## 2019-07-18 DIAGNOSIS — R21 Rash and other nonspecific skin eruption: Secondary | ICD-10-CM | POA: Diagnosis not present

## 2019-07-18 DIAGNOSIS — L810 Postinflammatory hyperpigmentation: Secondary | ICD-10-CM | POA: Diagnosis not present

## 2019-07-18 DIAGNOSIS — L308 Other specified dermatitis: Secondary | ICD-10-CM | POA: Diagnosis not present

## 2019-07-23 DIAGNOSIS — E782 Mixed hyperlipidemia: Secondary | ICD-10-CM | POA: Diagnosis not present

## 2019-07-23 DIAGNOSIS — E119 Type 2 diabetes mellitus without complications: Secondary | ICD-10-CM | POA: Diagnosis not present

## 2019-07-23 DIAGNOSIS — I10 Essential (primary) hypertension: Secondary | ICD-10-CM | POA: Diagnosis not present

## 2019-07-24 DIAGNOSIS — L308 Other specified dermatitis: Secondary | ICD-10-CM | POA: Diagnosis not present

## 2019-07-24 DIAGNOSIS — I10 Essential (primary) hypertension: Secondary | ICD-10-CM | POA: Diagnosis not present

## 2019-07-26 DIAGNOSIS — D72819 Decreased white blood cell count, unspecified: Secondary | ICD-10-CM | POA: Diagnosis not present

## 2019-08-05 DIAGNOSIS — D61818 Other pancytopenia: Secondary | ICD-10-CM | POA: Diagnosis not present

## 2019-08-05 DIAGNOSIS — D709 Neutropenia, unspecified: Secondary | ICD-10-CM | POA: Diagnosis not present

## 2019-08-05 DIAGNOSIS — D72819 Decreased white blood cell count, unspecified: Secondary | ICD-10-CM | POA: Diagnosis not present

## 2019-08-06 NOTE — Progress Notes (Deleted)
Due to the COVID-19 crisis, this virtual check-in visit was done via telephone from my office and it was initiated and consent given by this patient and or family.   Telephone (Audio) Visit The purpose of this telephone visit is to provide medical care while limiting exposure to the novel coronavirus.    Consent was obtained for telephone visit and initiated by pt/family:  {yes no:314532} Answered questions that patient had about telehealth interaction:  {yes no:314532} I discussed the limitations, risks, security and privacy concerns of performing an evaluation and management service by telephone. I also discussed with the patient that there may be a patient responsible charge related to this service. The patient expressed understanding and agreed to proceed.  Pt location: Home Physician Location: office Name of referring provider:  Lucianne Lei, MD I connected with .[name] at patients initiation/request on 08/08/2019 at  2:30 PM EST by telephone and verified that I am speaking with the correct person using two identifiers.  Pt MRN:  [id number] Pt DOB:  1941-06-03   History of Present Illness: *** Patient seen today for follow-up of essential tremor.  Primidone was increased last visit, although we also discussed that her medications for her lungs could certainly play a role in increasing tremor.  She called me a few months later stating that she had a rash, then went to see her dermatologist.  The dermatologist told her that it was caused by her medication and recommended her not taking as much.  I was not sure that this physiologically made sense.  She started taking the medication in May, 2019 and expressed to her that it would be unusual for her to develop an allergy now, and if she had an allergy, not taking "as much (as opposed to not taking it at all) would not be the solution.  I was wondering if she understood her dermatologist appropriately.  She stated that she was going to  follow-up with a dermatologist in a few days.  I never heard back.  I was able to review her dermatology records.  The rash was biopsied on October 29.  At her follow-up on November 12, the patient notes from dermatology indicated "she decreased the dose of primidone medication, but the skin lesions keep flaring up."  Dermatology records from November 18 indicate that patient stopped all medications 5 days prior and the rash was the same, and there was a new lesion on the left cheek.  Rash was biopsied again.  All medications continued to be discontinued when she followed up on December 10.  The plaques on the face continued to grow.  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  She was referred to hematology.  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  Current Movement d/o meds: ***Primidone, 50 mg, 2 tablets in the morning and 1 tablet at night (increased last visit)   Observations/Objective:  *** There were no vitals filed for this visit.  Assessment and Plan:  *** 1.  ET  -***primidone 2.  Rash  -After a long work-up, she has been diagnosed with eosinophilic annular dermatitis.  Rash was not very actually felt to be related to primidone, but now is not felt to be related.  She had been off of primidone for quite some time and rash continued to  worsen.  -Is now being seen by hematology and had a bone marrow biopsy earlier this week. 3.  Leukopenia  -As above, just had bone marrow biopsy, although hematologist felt that likely this was going to be benign ethnic variant, as she has had leukopenia a long time. Need for in person visit now:  {yes no:314532} Follow Up Instructions:    -I discussed the assessment and treatment plan with the patient. The patient was provided an opportunity to ask questions and all were  answered. The patient agreed with the plan and demonstrated an understanding of the instructions.   The patient was advised to call back or seek an in-person evaluation if the symptoms worsen or if the condition fails to improve as anticipated.    Total Time spent in visit with the patient was:  ***, of which 100% of the time was spent in counseling and/or coordinating care on ***.   Pt understands and agrees with the plan of care outlined.     Alonza Bogus, DO

## 2019-08-07 ENCOUNTER — Encounter: Payer: Self-pay | Admitting: Neurology

## 2019-08-08 ENCOUNTER — Other Ambulatory Visit: Payer: Self-pay

## 2019-08-08 ENCOUNTER — Other Ambulatory Visit: Payer: Self-pay | Admitting: Neurology

## 2019-08-08 ENCOUNTER — Telehealth (INDEPENDENT_AMBULATORY_CARE_PROVIDER_SITE_OTHER): Payer: Medicare Other | Admitting: Neurology

## 2019-08-08 ENCOUNTER — Telehealth: Payer: Medicare Other | Admitting: Neurology

## 2019-08-08 VITALS — Ht 65.5 in | Wt 192.0 lb

## 2019-08-08 DIAGNOSIS — D72819 Decreased white blood cell count, unspecified: Secondary | ICD-10-CM | POA: Diagnosis not present

## 2019-08-08 DIAGNOSIS — G250 Essential tremor: Secondary | ICD-10-CM

## 2019-08-08 DIAGNOSIS — L308 Other specified dermatitis: Secondary | ICD-10-CM | POA: Diagnosis not present

## 2019-08-08 MED ORDER — PRIMIDONE 50 MG PO TABS: 100.0000 mg | ORAL_TABLET | Freq: Two times a day (BID) | ORAL | 1 refills | Status: DC

## 2019-08-08 NOTE — Progress Notes (Signed)
 Due to the COVID-19 crisis, this virtual check-in visit was done via telephone from my office and it was initiated and consent given by this patient and or family.   Telephone (Audio) Visit The purpose of this telephone visit is to provide medical care while limiting exposure to the novel coronavirus.    Consent was obtained for telephone visit and initiated by pt/family:  Yes.   Answered questions that patient had about telehealth interaction:  Yes.   I discussed the limitations, risks, security and privacy concerns of performing an evaluation and management service by telephone. I also discussed with the patient that there may be a patient responsible charge related to this service. The patient expressed understanding and agreed to proceed.  Pt location: Home Physician Location: home Name of referring provider:  Bland, Veita, MD I connected with .[name] at patients initiation/request on 08/08/2019 at 10:45 AM EST by telephone and verified that I am speaking with the correct person using two identifiers.  Pt MRN:  [id number] Pt DOB:  01/06/1941   History of Present Illness:  Patient seen today for follow-up of essential tremor.  Primidone was increased last visit, although we also discussed that her medications for her lungs could certainly play a role in increasing tremor.  She called me a few months later stating that she had a rash, then went to see her dermatologist.  The dermatologist told her that it was caused by her medication and recommended her not taking as much.  I was not sure that this physiologically made sense.  She started taking the medication in May, 2019 and expressed to her that it would be somewhat unusual for her to develop an allergy now, and if she had an allergy, not taking "as much (as opposed to not taking it at all) would generally not be the solution.  I was wondering if she understood her dermatologist appropriately.  She stated that she was going to  follow-up with a dermatologist in a few days.  I never heard back but  I was able to review her dermatology records.  The rash was biopsied on October 29.  At her follow-up on November 12, the patient notes from dermatology indicated "she decreased the dose of primidone medication, but the skin lesions keep flaring up."  Dermatology records from November 18 indicate that patient stopped all medications 5 days prior and the rash was the same, and there was a new lesion on the left cheek.  Rash was biopsied again.  All medications continued to be discontinued when she followed up on December 10.  The plaques on the face continued to grow.  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  She was referred to hematology.  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  Patient states that while she stayed off some medications, she did go back on the primidone.  There is some days that she thinks that it works better than others.  She has no trouble with activities of daily living, but does occasionally have some difficulty with writing.  She does not have any side effects with the medication.  Current Movement d/o meds: Primidone, 50 mg, 2 tablets in the morning and 1 tablet at night (increased last visit)   Observations/Objective:   Vitals:   08/07/19 1448    Weight: 192 lb (87.1 kg)  Height: 5' 5.5" (1.664 m)  She is alert and oriented x3.  She is able to accurately give her history.  Assessment and Plan:   1.  ET  -Increase primidone, 50 mg, 2 tablets twice per day 2.  Rash  -After a long work-up, she has been diagnosed with eosinophilic annular dermatitis.  Rash was initially felt to be related to primidone, but now is not felt to be related.  She had been off of primidone for quite some time and rash continued to worsen.  -Is now  being seen by hematology and had a bone marrow biopsy earlier this week. 3.  Leukopenia  -As above, just had bone marrow biopsy, although hematologist felt that likely this was going to be benign ethnic variant, as she has had leukopenia a long time. Need for in person visit now:  No. Follow Up Instructions:  5 months  -I discussed the assessment and treatment plan with the patient. The patient was provided an opportunity to ask questions and all were answered. The patient agreed with the plan and demonstrated an understanding of the instructions.   The patient was advised to call back or seek an in-person evaluation if the symptoms worsen or if the condition fails to improve as anticipated.    Total Time spent in visit with the patient was:  12 min, of which 100% of the time was spent in counseling and/or coordinating care, as above.   Pt understands and agrees with the plan of care outlined.     Rebecca Tat, DO  

## 2019-08-09 ENCOUNTER — Telehealth: Payer: Self-pay | Admitting: Neurology

## 2019-08-09 NOTE — Telephone Encounter (Signed)
Pt should be on primidone 50 mg, 2 po bid.  They can cancel all other RX's for primidone

## 2019-08-09 NOTE — Telephone Encounter (Signed)
Pharmacy is aware and all other RXs are canceled.

## 2019-08-09 NOTE — Telephone Encounter (Signed)
Patient's pharmacy called and left a message requesting a call back to clarify prescription directions. They said they had two of the same prescriptions with different directions but did not leave a name of the medication.  Please call pharmacy for more details. Patient seen 08/08/19 by Dr. Carles Collet.

## 2019-08-13 ENCOUNTER — Other Ambulatory Visit: Payer: Self-pay

## 2019-08-13 MED ORDER — PRIMIDONE 50 MG PO TABS: 100.0000 mg | ORAL_TABLET | Freq: Two times a day (BID) | ORAL | 1 refills | Status: DC

## 2019-08-15 DIAGNOSIS — D700 Congenital agranulocytosis: Secondary | ICD-10-CM | POA: Diagnosis not present

## 2019-08-15 DIAGNOSIS — D704 Cyclic neutropenia: Secondary | ICD-10-CM | POA: Diagnosis not present

## 2019-08-21 ENCOUNTER — Encounter: Payer: Self-pay | Admitting: Allergy

## 2019-08-21 ENCOUNTER — Ambulatory Visit (INDEPENDENT_AMBULATORY_CARE_PROVIDER_SITE_OTHER): Payer: Medicare Other | Admitting: Allergy

## 2019-08-21 ENCOUNTER — Other Ambulatory Visit: Payer: Self-pay

## 2019-08-21 VITALS — BP 142/80 | HR 74 | Temp 96.6°F | Resp 16 | Ht 65.5 in | Wt 196.8 lb

## 2019-08-21 DIAGNOSIS — R05 Cough: Secondary | ICD-10-CM

## 2019-08-21 DIAGNOSIS — J310 Chronic rhinitis: Secondary | ICD-10-CM

## 2019-08-21 DIAGNOSIS — R059 Cough, unspecified: Secondary | ICD-10-CM

## 2019-08-21 MED ORDER — IPRATROPIUM BROMIDE 0.06 % NA SOLN: 2.0000 | Freq: Three times a day (TID) | NASAL | 5 refills | Status: DC

## 2019-08-21 MED ORDER — LEVALBUTEROL TARTRATE 45 MCG/ACT IN AERO: 2.0000 | INHALATION_SPRAY | RESPIRATORY_TRACT | 1 refills | Status: DC | PRN

## 2019-08-21 NOTE — Patient Instructions (Addendum)
Cough and rhinitis  - post nasal drip leading to throat irritation and cough.    - continue nasal antihistamine Astelin 2 sprays each nostril 1-2 times a day for nasal drainage.    - use nasal atrovent 0.06% 2 sprays each nostril as needed for nasal drainage control.   Can be used up to 3-4 times a day if needed  - continue use of saline wash prior to using your nasal spray  - continue singulair 10mg  at bedtime  - use Xopenex 2 inhalations every 4-6 hours as needed for cough/wheeze/shortness of breath/chest tightness.  This is a quick relief medication.    - continue Symbicort 145mcg 2 puffs twice a day   - continue RyVent 6mg  and may take up to twice a day to help with nasal drainage.    Follow-up 6 months or sooner if needed

## 2019-08-21 NOTE — Progress Notes (Signed)
Follow-up Note  RE: [name] MRN: [id number] DOB: 09/19/1940 Date of Office Visit: 08/21/2019   History of present illness: [name] is a 79 y.o. female presenting today for follow-up of cough and rhinitis.  She was last seen in the office on March 20, 2019 by myself.  She has had a remarkable winter and states after seeing me in September she started to develop a rash of round dark spots all over her body.  She states she has had 4 biopsies and is following with dermatology.  She has been diagnosed with annular dermatitis and has been recommended to use mometasone.  With this rash she states she had not had any change of medication or dose changes.  The dark spots can be painful and itchy.  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  She did have a CT guided bone marrow biopsy performed that did show reduced number of myeloid cells however no blasts or evidence of cancer was seen.  She states her white counts will just be followed overtime.  She state she also has had an abdominal US that was unremarkable.  She states she is having more nasal drainage despite using Astelin 2 sprays twice a day.  She is also taking Ryvent and singulair daily.  She states she is using her Xopenex about 1-2 weeks on average.  She is using Symbicort 119mg 2 puffs twice a day.  Not having nighttime awakenings.  Has not needed any systemic steroid.    Review of systems: Review of Systems  Constitutional: Negative.   HENT:       See HPI  Eyes: Negative.   Respiratory: Negative.   Cardiovascular: Negative.   Gastrointestinal: Negative.   Musculoskeletal: Negative.   Skin: Positive for itching and rash.  Neurological: Negative.     All other systems negative unless noted above in HPI  Past medical/social/surgical/family history have been reviewed and are unchanged unless specifically indicated below.  No  changes  Medication List: Current Outpatient Medications  Medication Sig Dispense Refill  . Ascorbic Acid (VITAMIN C) 250 MG CHEW Chew 250 mg by mouth.    .Marland Kitchenazelastine (ASTELIN) 0.1 % nasal spray Place 2 sprays into both nostrils 2 (two) times daily. 90 mL 1  . betamethasone dipropionate (DIPROLENE) 0.05 % ointment     . budesonide-formoterol (SYMBICORT) 160-4.5 MCG/ACT inhaler Inhale 2 puffs into the lungs 2 (two) times daily. 1 Inhaler 5  . CALCIUM-MAGNESIUM-ZINC PO Take by mouth.    . Carbinoxamine Maleate (RYVENT) 6 MG TABS Take 1 tablet by mouth every 8 (eight) hours. 120 tablet 5  . cholecalciferol (VITAMIN D3) 25 MCG (1000 UT) tablet Take 1,000 Units by mouth daily.    . clindamycin (CLEOCIN-T) 1 % lotion     . clobetasol cream (TEMOVATE) 0.05 %     . Colesevelam HCl (WELCHOL) 3.75 g PACK     . Diclofenac Sodium (PENNSAID) 1.5 % SOLN Place onto the skin. Reported on 08/26/2015    . diclofenac sodium (VOLTAREN) 1 % GEL     . docusate sodium (COLACE) 100 MG capsule Take 100 mg by mouth daily as needed for mild constipation.    . mometasone (ELOCON) 0.1 % cream Apply 1 application topically daily.    . montelukast (SINGULAIR) 10 MG tablet Take 1 tablet (10 mg total) by mouth at bedtime. 90 tablet 1  . multivitamin-iron-minerals-folic acid (CENTRUM) chewable  tablet Chew 1 tablet by mouth daily.    . niacin (NIASPAN) 500 MG CR tablet Take 500 mg by mouth at bedtime.    Marland Kitchen olmesartan-hydrochlorothiazide (BENICAR HCT) 20-12.5 MG per tablet Take 1 tablet by mouth daily.    . Oxymetazoline HCl (QC NASAL SPRAY NA) Place into the nose.    . Pitavastatin Calcium (LIVALO) 4 MG TABS Take by mouth.    . primidone (MYSOLINE) 50 MG tablet Take 2 tablets (100 mg total) by mouth 2 (two) times daily. 360 tablet 1  . ipratropium (ATROVENT) 0.06 % nasal spray Place 2 sprays into both nostrils 3 (three) times daily. 15 mL 5  . levalbuterol (XOPENEX HFA) 45 MCG/ACT inhaler Inhale 2 puffs into the lungs every  4 (four) hours as needed. 1 Inhaler 1   No current facility-administered medications for this visit.     Known medication allergies: Allergies  Allergen Reactions  . Latex Shortness Of Breath     Physical examination: Blood pressure (!) 142/80, pulse 74, temperature (!) 96.6 F (35.9 C), temperature source Temporal, resp. rate 16, height 5' 5.5" (1.664 m), weight 196 lb 12.8 oz (89.3 kg), SpO2 96 %.  General: Alert, interactive, in no acute distress. HEENT: PERRLA, TMs pearly gray, turbinates non-edematous with clear discharge, post-pharynx non erythematous. Neck: Supple without lymphadenopathy. Lungs: Clear to auscultation without wheezing, rhonchi or rales. {no increased work of breathing. CV: Normal S1, S2 without murmurs. Abdomen: Nondistended, nontender. Skin: hyperpigment round patches about quarter size on forearms. Extremities:  No clubbing, cyanosis or edema. Neuro:   Grossly intact.  Diagnositics/Labs: None today  Assessment and plan:   Cough and rhinitis  - post nasal drip leading to throat irritation and cough.    - continue nasal antihistamine Astelin 2 sprays each nostril 1-2 times a day for nasal drainage.    - use nasal atrovent 0.06% 2 sprays each nostril as needed for nasal drainage control.   Can be used up to 3-4 times a day if needed  - continue use of saline wash prior to using your nasal spray  - continue singulair 28m at bedtime  - use Xopenex 2 inhalations every 4-6 hours as needed for cough/wheeze/shortness of breath/chest tightness.  This is a quick relief medication.    - continue Symbicort 1651m 2 puffs twice a day   - continue RyVent 68m30mnd may take up to twice a day to help with nasal drainage.    Follow-up 6 months or sooner if needed  I appreciate the opportunity to take part in [name]'s care. Please do not hesitate to contact me with questions.  Sincerely,   ShaPrudy FeelerD [facility]

## 2019-08-29 DIAGNOSIS — L531 Erythema annulare centrifugum: Secondary | ICD-10-CM | POA: Diagnosis not present

## 2019-08-29 DIAGNOSIS — L810 Postinflammatory hyperpigmentation: Secondary | ICD-10-CM | POA: Diagnosis not present

## 2019-09-04 DIAGNOSIS — Z1231 Encounter for screening mammogram for malignant neoplasm of breast: Secondary | ICD-10-CM | POA: Diagnosis not present

## 2019-09-26 DIAGNOSIS — E1100 Type 2 diabetes mellitus with hyperosmolarity without nonketotic hyperglycemic-hyperosmolar coma (NKHHC): Secondary | ICD-10-CM | POA: Diagnosis not present

## 2019-09-26 DIAGNOSIS — E119 Type 2 diabetes mellitus without complications: Secondary | ICD-10-CM | POA: Diagnosis not present

## 2019-09-26 DIAGNOSIS — I10 Essential (primary) hypertension: Secondary | ICD-10-CM | POA: Diagnosis not present

## 2019-09-26 DIAGNOSIS — E785 Hyperlipidemia, unspecified: Secondary | ICD-10-CM | POA: Diagnosis not present

## 2019-09-26 DIAGNOSIS — E782 Mixed hyperlipidemia: Secondary | ICD-10-CM | POA: Diagnosis not present

## 2019-09-26 DIAGNOSIS — E1169 Type 2 diabetes mellitus with other specified complication: Secondary | ICD-10-CM | POA: Diagnosis not present

## 2019-09-26 DIAGNOSIS — R799 Abnormal finding of blood chemistry, unspecified: Secondary | ICD-10-CM | POA: Diagnosis not present

## 2019-09-27 DIAGNOSIS — M130 Polyarthritis, unspecified: Secondary | ICD-10-CM | POA: Diagnosis not present

## 2019-09-27 DIAGNOSIS — E1100 Type 2 diabetes mellitus with hyperosmolarity without nonketotic hyperglycemic-hyperosmolar coma (NKHHC): Secondary | ICD-10-CM | POA: Diagnosis not present

## 2019-09-27 DIAGNOSIS — R251 Tremor, unspecified: Secondary | ICD-10-CM | POA: Diagnosis not present

## 2019-09-27 DIAGNOSIS — E782 Mixed hyperlipidemia: Secondary | ICD-10-CM | POA: Diagnosis not present

## 2019-10-31 DIAGNOSIS — L531 Erythema annulare centrifugum: Secondary | ICD-10-CM | POA: Diagnosis not present

## 2019-10-31 DIAGNOSIS — L810 Postinflammatory hyperpigmentation: Secondary | ICD-10-CM | POA: Diagnosis not present

## 2019-11-18 DIAGNOSIS — E1100 Type 2 diabetes mellitus with hyperosmolarity without nonketotic hyperglycemic-hyperosmolar coma (NKHHC): Secondary | ICD-10-CM | POA: Diagnosis not present

## 2019-11-18 DIAGNOSIS — I10 Essential (primary) hypertension: Secondary | ICD-10-CM | POA: Diagnosis not present

## 2019-11-18 DIAGNOSIS — G252 Other specified forms of tremor: Secondary | ICD-10-CM | POA: Diagnosis not present

## 2019-11-18 DIAGNOSIS — E785 Hyperlipidemia, unspecified: Secondary | ICD-10-CM | POA: Diagnosis not present

## 2019-11-29 DIAGNOSIS — E785 Hyperlipidemia, unspecified: Secondary | ICD-10-CM | POA: Diagnosis not present

## 2019-11-29 DIAGNOSIS — I10 Essential (primary) hypertension: Secondary | ICD-10-CM | POA: Diagnosis not present

## 2019-11-29 DIAGNOSIS — E1100 Type 2 diabetes mellitus with hyperosmolarity without nonketotic hyperglycemic-hyperosmolar coma (NKHHC): Secondary | ICD-10-CM | POA: Diagnosis not present

## 2020-01-13 ENCOUNTER — Encounter: Payer: Self-pay | Admitting: Orthopedic Surgery

## 2020-01-13 ENCOUNTER — Other Ambulatory Visit: Payer: Self-pay

## 2020-01-13 ENCOUNTER — Ambulatory Visit: Payer: Self-pay

## 2020-01-13 ENCOUNTER — Ambulatory Visit (INDEPENDENT_AMBULATORY_CARE_PROVIDER_SITE_OTHER): Payer: Medicare Other | Admitting: Physician Assistant

## 2020-01-13 DIAGNOSIS — M25562 Pain in left knee: Secondary | ICD-10-CM

## 2020-01-13 DIAGNOSIS — G8929 Other chronic pain: Secondary | ICD-10-CM

## 2020-01-13 DIAGNOSIS — M25561 Pain in right knee: Secondary | ICD-10-CM | POA: Diagnosis not present

## 2020-01-13 MED ORDER — LIDOCAINE HCL 1 % IJ SOLN
5.0000 mL | INTRAMUSCULAR | Status: AC | PRN
  Administered 2020-01-13: 5 mL

## 2020-01-13 MED ORDER — METHYLPREDNISOLONE ACETATE 40 MG/ML IJ SUSP
40.0000 mg | INTRAMUSCULAR | Status: AC | PRN
  Administered 2020-01-13: 40 mg via INTRA_ARTICULAR

## 2020-01-13 NOTE — Progress Notes (Signed)
Office Visit Note   Patient: [name]           Date of Birth: [birth year]           MRN: [id number] Visit Date: 01/13/2020              Requested by: Lucianne Lei, MD [address] [place],  [place] [address] PCP: Lucianne Lei, MD  Chief Complaint  Patient presents with  . Right Knee - Pain  . Left Knee - Pain      HPI: This is a pleasant 79 year old woman with a chief complaint of right greater than left knee pain.  She is status post left knee arthroplasty and revision with long stems.  She said during the her recovery her right after the first surgery she had difficulty fully flexing her leg.  She also has a story of having an injury after surgery.  She thinks it was with her quadriceps.  Her right knee is more painful today and she thinks it because she depends on it more.  She has had an injection in the past on the right and done therapy on the left and it seems to have helped her.  Assessment & Plan: Visit Diagnoses:  1. Chronic pain of both knees     Plan: She will resume physical therapy on the left for quadricep strengthening is much as she can.  Also fall prevention and gait training.  On the right she like to go forward with an injection today  Follow-Up Instructions: No follow-ups on file.   Ortho Exam  Patient is alert, oriented, no adenopathy, well-dressed, normal affect, normal respiratory effort. Focused examination of her right knee demonstrates no effusion.  Mild soft tissue swelling pain with flexion and extension.  No instability findings.  Left knee she is status post left total knee replacement x2.  She only flexes to about 75 degrees.  She said this is been since her first surgery.  She can extend almost to full extension.  She is able to fire her quad but is very weak no effusion no cellulitis  Imaging: No results found. No images are attached to the encounter.  Labs: No results found for: HGBA1C, ESRSEDRATE, CRP, LABURIC, REPTSTATUS,  GRAMSTAIN, CULT, LABORGA   No results found for: ALBUMIN, PREALBUMIN, LABURIC  No results found for: MG No results found for: VD25OH  No results found for: PREALBUMIN CBC EXTENDED Latest Ref Rng & Units 11/09/2005  WBC 3.9 - 10.0 10e3/uL 2.5(L)  RBC 3.70 - 5.32 10e6/uL 4.61  HGB 11.6 - 15.9 g/dL 14.1  HCT 34 - 46 % 41.8  PLT 145 - 400 10e3/uL 174  NEUTROABS 1.5 - 6.5 10e3/uL 0.8(L)  LYMPHSABS 0.9 - 3.3 10e3/uL 1.4     There is no height or weight on file to calculate BMI.  Orders:  Orders Placed This Encounter  Procedures  . XR Knee 1-2 Views Right  . XR Knee 1-2 Views Left  . Ambulatory referral to Physical Therapy   No orders of the defined types were placed in this encounter.    Procedures: Large Joint Inj: R knee on 01/13/2020 11:34 AM Indications: pain and diagnostic evaluation Details: 22 G 1.5 in needle  Arthrogram: No  Medications: 40 mg methylPREDNISolone acetate 40 MG/ML; 5 mL lidocaine 1 % Outcome: tolerated well, no immediate complications Procedure, treatment alternatives, risks and benefits explained, specific risks discussed. Consent was given by the patient.      Clinical  Data: No additional findings.  ROS:  All other systems negative, except as noted in the HPI. Review of Systems  Objective: Vital Signs: There were no vitals taken for this visit.  Specialty Comments:  No specialty comments available.  PMFS History: Patient Active Problem List   Diagnosis Date Noted  . Unilateral primary osteoarthritis, right knee 11/21/2017  . Status post revision of total knee, left 11/21/2017  . Chronic rhinitis 02/25/2016  . Cough 02/25/2016  . Hoarseness of voice 02/25/2016  . Chronic leukopenia 12/15/2015   Past Medical History:  Diagnosis Date  . Asthma    allergic induced  . Cough   . Hypertension   . Rhinitis     Family History  Problem Relation Age of Onset  . Other Mother        died when patient was 28 months old  .  Hypertension Father   . Cerebral aneurysm Sister   . Breast cancer Sister   . Lung cancer Sister   . Healthy Son   . Allergic rhinitis Neg Hx   . Angioedema Neg Hx   . Asthma Neg Hx   . Eczema Neg Hx   . Immunodeficiency Neg Hx   . Urticaria Neg Hx     Past Surgical History:  Procedure Laterality Date  . ABDOMINAL HYSTERECTOMY    . BACK SURGERY    . CATARACT EXTRACTION    . MASS EXCISION  01/05/2012   Procedure: MINOR EXCISION OF MASS;  Surgeon: Cammie Sickle., MD;  Location: [facility];  Service: Orthopedics;  Laterality: Right;  excision mucoid cyst right long finger, debride DIP joint  . ROTATOR CUFF REPAIR Bilateral   . TOTAL KNEE ARTHROPLASTY Left    twice   Social History   Occupational History  . Occupation: retired    Comment: Warehouse manager  Tobacco Use  . Smoking status: Never Smoker  . Smokeless tobacco: Never Used  Vaping Use  . Vaping Use: Never used  Substance and Sexual Activity  . Alcohol use: No    Alcohol/week: 0.0 standard drinks  . Drug use: No  . Sexual activity: Not on file    Comment: retired from [employer]. 1 son.

## 2020-01-27 ENCOUNTER — Ambulatory Visit (INDEPENDENT_AMBULATORY_CARE_PROVIDER_SITE_OTHER): Payer: Medicare Other | Admitting: Rehabilitative and Restorative Service Providers"

## 2020-01-27 ENCOUNTER — Encounter: Payer: Self-pay | Admitting: Rehabilitative and Restorative Service Providers"

## 2020-01-27 ENCOUNTER — Other Ambulatory Visit: Payer: Self-pay

## 2020-01-27 DIAGNOSIS — M25562 Pain in left knee: Secondary | ICD-10-CM | POA: Diagnosis not present

## 2020-01-27 DIAGNOSIS — M25561 Pain in right knee: Secondary | ICD-10-CM | POA: Diagnosis not present

## 2020-01-27 DIAGNOSIS — G8929 Other chronic pain: Secondary | ICD-10-CM

## 2020-01-27 DIAGNOSIS — M25661 Stiffness of right knee, not elsewhere classified: Secondary | ICD-10-CM | POA: Diagnosis not present

## 2020-01-27 DIAGNOSIS — M6281 Muscle weakness (generalized): Secondary | ICD-10-CM | POA: Diagnosis not present

## 2020-01-27 DIAGNOSIS — R262 Difficulty in walking, not elsewhere classified: Secondary | ICD-10-CM

## 2020-01-27 DIAGNOSIS — M25662 Stiffness of left knee, not elsewhere classified: Secondary | ICD-10-CM | POA: Diagnosis not present

## 2020-01-27 NOTE — Patient Instructions (Signed)
Access Code: [email or web address] URL: [email or web address] Date: 01/27/2020 Prepared by: Scot Jun  Exercises Seated Straight Leg Heel Taps - 1 x daily - 7 x weekly - 3 sets - 10 reps Seated Long Arc Quad - 2 x daily - 7 x weekly - 10 reps - 3 sets - 2 hold Seated Knee Flexion AAROM - 4 x daily - 7 x weekly - 1 sets - 1 reps - minutes hold

## 2020-01-27 NOTE — Therapy (Signed)
[facility] [place], [place], [address] Phone: [phone]   Fax:  [phone]  Physical Therapy Evaluation  Patient Details  Name: [name] MRN: [id number] Date of Birth: 08/01/1940 Referring Provider (PT): Bevely Palmer Persons PA-C   Encounter Date: 01/27/2020   PT End of Session - 01/27/20 0858    Visit Number 1    Number of Visits 4    Date for PT Re-Evaluation 02/10/20    Authorization Type Medicare    Authorization Time Period -02/10/2020  (shortened due to leaving town this week)    Authorization - Visit Number 1    Authorization - Number of Visits 4    PT Start Time [phone]    PT Stop Time 0843    PT Time Calculation (min) 31 min    Activity Tolerance Patient tolerated treatment well    Behavior During Therapy [facility] for tasks assessed/performed           Past Medical History:  Diagnosis Date  . Asthma    allergic induced  . Cough   . Hypertension   . Rhinitis     Past Surgical History:  Procedure Laterality Date  . ABDOMINAL HYSTERECTOMY    . BACK SURGERY    . CATARACT EXTRACTION    . MASS EXCISION  01/05/2012   Procedure: MINOR EXCISION OF MASS;  Surgeon: Cammie Sickle., MD;  Location: [place];  Service: Orthopedics;  Laterality: Right;  excision mucoid cyst right long finger, debride DIP joint  . ROTATOR CUFF REPAIR Bilateral   . TOTAL KNEE ARTHROPLASTY Left    twice    There were no vitals filed for this visit.    Subjective Assessment - 01/27/20 0814    Subjective Pt. stated Lt and Rt knee pain, Lt most notable.  Pt. has history of TKA on Lt knee with last surgery approx. 2006.    Limitations Standing;Walking    Patient Stated Goals Reduce pain, walk independent    Currently in Pain? Yes    Pain Score 4    at worst 8/10.   Pain Location Knee    Pain Orientation Left;Right    Pain Descriptors / Indicators Aching;Tightness    Pain Type Chronic pain    Pain Onset More than a  month ago    Pain Frequency Constant    Aggravating Factors  Bending, squatting, transfers, walking/standing    Pain Relieving Factors Water aerobics prior to COVID    Effect of Pain on Daily Activities Limited independent ambulation              [facility] PT Assessment - 01/27/20 0001      Assessment   Medical Diagnosis Chronic bilateral knee pain    Referring Provider (PT) Bevely Palmer Persons PA-C    Onset Date/Surgical Date 06/21/19      Precautions   Precautions None      Restrictions   Weight Bearing Restrictions No      Balance Screen   Has the patient fallen in the past 6 months No    Is the patient reluctant to leave their home because of a fear of falling?  No      Home Environment   Living Environment Private residence    Home Access Stairs to enter    Entrance Stairs-Number of Steps 2   step to gait pattern   Entrance Stairs-Rails None      Prior Function   Level of Independence  [place], previously water aerobic, walking      Cognition   Overall Cognitive Status Within Functional Limits for tasks assessed      ROM / Strength   AROM / PROM / Strength Strength;PROM;AROM      AROM   AROM Assessment Site Knee    Right/Left Knee Left;Right    Right Knee Extension 0    Right Knee Flexion 100    Left Knee Extension -9    Left Knee Flexion 35      PROM   PROM Assessment Site Knee    Right/Left Knee Left;Right    Left Knee Extension -9    Left Knee Flexion 35      Strength   Strength Assessment Site Knee;Ankle;Hip    Right/Left Hip Left;Right    Right Hip Flexion 5/5    Left Hip Flexion 4/5    Right/Left Knee Left;Right    Right Knee Flexion 5/5    Right Knee Extension 5/5    Left Knee Flexion 4/5    Left Knee Extension 4/5    Right/Left Ankle Left;Right    Right Ankle Dorsiflexion 5/5    Left Ankle Dorsiflexion 5/5      Palpation   Palpation comment Limited patellar mobility Lt knee      Special Tests   Other special tests  Rt SLS 15 seconds, Lt 10 seconds      Transfers   Comments Unable to perform sit to stand from 18 inch chair s UE assist      Ambulation/Gait   Gait Comments SPC in Rt UE c step to gait pattern c shortened stride length on Rt                       Objective measurements completed on examination: See above findings.       [facility] Adult PT Treatment/Exercise - 01/27/20 0001      Exercises   Exercises Other Exercises    Other Exercises  HEP instruction and review c performance c cues.  Consisted of seated SLR bilateral x 15, seated knee flexion stretch to tolerance duration, LAQ alternating                  PT Education - 01/27/20 0858    Education Details HEP, instruction to set up skilled PT in [place]    Person(s) Educated Patient    Methods Explanation;Demonstration;Verbal cues;Handout    Comprehension Returned demonstration;Verbalized understanding               PT Long Term Goals - 01/27/20 0855      PT LONG TERM GOAL #1   Title Patient will demonstrate/report pain at worst less than or equal to 2/10 to facilitate minimal limitation in daily activity secondary to pain symptoms.    Time 2    Period Weeks    Status New    Target Date 02/10/20      PT LONG TERM GOAL #2   Title Patient will demonstrate independent use of home exercise program to facilitate ability to maintain/progress functional gains from skilled physical therapy services.    Time 2    Period Weeks    Status New    Target Date 02/10/20                  Plan - 01/27/20 0834    Clinical Impression Statement Patient is a 79 y.o. female who comes to clinic  with complaints of bilateral knee pain with mobility, strength and movement coordination deficits that impair their ability to perform usual daily and recreational functional activities without increase difficulty/symptoms at this time.  Patient to benefit from skilled PT services to address impairments and limitations  to improve to previous level of function without restriction secondary to condition.    Personal Factors and Comorbidities Other   length of time since onset, HTN   Examination-Activity Limitations Bend;Lift;Stand;Stairs;Squat;Sit;Transfers;Locomotion Level    Examination-Participation Restrictions Other;Community Activity   community ambulation   Stability/Clinical Decision Making Stable/Uncomplicated    Clinical Decision Making Low    Rehab Potential Fair    PT Frequency --   2x/week   PT Duration 2 weeks    PT Treatment/Interventions ADLs/Self Care Home Management;Cryotherapy;Electrical Stimulation;Spinal Manipulations;Joint Manipulations;Passive range of motion;Dry needling;Taping;Splinting;Neuromuscular re-education;Patient/family education;Therapeutic exercise;Therapeutic activities;Balance training;Functional mobility training;Stair training;Gait training;Ultrasound;Iontophoresis 4mg /ml Dexamethasone;Moist Heat    PT Next Visit Plan One more visit prior to going out of town for 3 months.    PT Home Exercise Plan [email or web address]    Recommended Other Services Skilled PT services while out of town    Consulted and Agree with Plan of Care Patient           Patient will benefit from skilled therapeutic intervention in order to improve the following deficits and impairments:  Abnormal gait, Decreased coordination, Decreased range of motion, Difficulty walking, Pain, Decreased activity tolerance, Decreased balance, Hypomobility, Impaired flexibility, Decreased strength, Decreased mobility  Visit Diagnosis: Chronic pain of left knee - Plan: PT plan of care cert/re-cert  Chronic pain of right knee - Plan: PT plan of care cert/re-cert  Muscle weakness (generalized) - Plan: PT plan of care cert/re-cert  Stiffness of left knee, not elsewhere classified - Plan: PT plan of care cert/re-cert  Stiffness of right knee, not elsewhere classified - Plan: PT plan of care cert/re-cert  Difficulty in  walking, not elsewhere classified - Plan: PT plan of care cert/re-cert     Problem List Patient Active Problem List   Diagnosis Date Noted  . Unilateral primary osteoarthritis, right knee 11/21/2017  . Status post revision of total knee, left 11/21/2017  . Chronic rhinitis 02/25/2016  . Cough 02/25/2016  . Hoarseness of voice 02/25/2016  . Chronic leukopenia 12/15/2015   Scot Jun, PT, DPT, OCS, ATC 01/27/20  9:11 AM    [facility] Physical Therapy [address] [place], [place], [address] Phone: [phone]   Fax:  [phone]  Name: [name] MRN: [id number] Date of Birth: May 27, 1941

## 2020-01-30 ENCOUNTER — Ambulatory Visit (INDEPENDENT_AMBULATORY_CARE_PROVIDER_SITE_OTHER): Payer: Medicare Other | Admitting: Rehabilitative and Restorative Service Providers"

## 2020-01-30 ENCOUNTER — Encounter: Payer: Self-pay | Admitting: Rehabilitative and Restorative Service Providers"

## 2020-01-30 ENCOUNTER — Other Ambulatory Visit: Payer: Self-pay

## 2020-01-30 DIAGNOSIS — M25562 Pain in left knee: Secondary | ICD-10-CM

## 2020-01-30 DIAGNOSIS — M25561 Pain in right knee: Secondary | ICD-10-CM

## 2020-01-30 DIAGNOSIS — M6281 Muscle weakness (generalized): Secondary | ICD-10-CM | POA: Diagnosis not present

## 2020-01-30 DIAGNOSIS — H6123 Impacted cerumen, bilateral: Secondary | ICD-10-CM | POA: Diagnosis not present

## 2020-01-30 DIAGNOSIS — G8929 Other chronic pain: Secondary | ICD-10-CM

## 2020-01-30 DIAGNOSIS — M25661 Stiffness of right knee, not elsewhere classified: Secondary | ICD-10-CM | POA: Diagnosis not present

## 2020-01-30 DIAGNOSIS — R262 Difficulty in walking, not elsewhere classified: Secondary | ICD-10-CM

## 2020-01-30 DIAGNOSIS — M25662 Stiffness of left knee, not elsewhere classified: Secondary | ICD-10-CM | POA: Diagnosis not present

## 2020-01-30 NOTE — Therapy (Addendum)
[facility] Physical Therapy [address] [place], [place], [address] Phone: [phone]   Fax:  [phone]  Physical Therapy Treatment/Discharge  Patient Details  Name: [name] MRN: [id number] Date of Birth: 11/28/1940 Referring Provider (PT): Bevely Palmer Persons PA-C   Encounter Date: 01/30/2020   PT End of Session - 01/30/20 1217    Visit Number 2    Number of Visits 4    Date for PT Re-Evaluation 02/10/20    Authorization Type Medicare    Authorization Time Period -02/10/2020  (shortened due to leaving town this week)    Authorization - Visit Number 1    Authorization - Number of Visits 4    PT Start Time 1147    PT Stop Time 1226    PT Time Calculation (min) 39 min    Activity Tolerance Patient tolerated treatment well    Behavior During Therapy [facility] for tasks assessed/performed           Past Medical History:  Diagnosis Date  . Asthma    allergic induced  . Cough   . Hypertension   . Rhinitis     Past Surgical History:  Procedure Laterality Date  . ABDOMINAL HYSTERECTOMY    . BACK SURGERY    . CATARACT EXTRACTION    . MASS EXCISION  01/05/2012   Procedure: MINOR EXCISION OF MASS;  Surgeon: Cammie Sickle., MD;  Location: [place];  Service: Orthopedics;  Laterality: Right;  excision mucoid cyst right long finger, debride DIP joint  . ROTATOR CUFF REPAIR Bilateral   . TOTAL KNEE ARTHROPLASTY Left    twice    There were no vitals filed for this visit.   Subjective Assessment - 01/30/20 1151    Subjective Pt. indicated symptoms come and go, rated 3/10 today.    Limitations Standing;Walking    Patient Stated Goals Reduce pain, walk independent    Currently in Pain? Yes    Pain Score 3     Pain Location Knee    Pain Orientation Left;Right    Pain Onset More than a month ago                             [facility] Adult PT Treatment/Exercise - 01/30/20 0001      Exercises   Exercises  Knee/Hip      Knee/Hip Exercises: Stretches   Other Knee/Hip Stretches incline calf stretch 30 sec x 5      Knee/Hip Exercises: Aerobic   Nustep Lvl 5 7 mins c 5 sec stretch holds for Lt knee flexion mixed in      Knee/Hip Exercises: Seated   Other Seated Knee/Hip Exercises slr 2 x 10 bilateral      Manual Therapy   Manual therapy comments seated distraction, ir, flexion mobility Lt knee, plus static stretching 2 mins x 2                       PT Long Term Goals - 01/27/20 8413      PT LONG TERM GOAL #1   Title Patient will demonstrate/report pain at worst less than or equal to 2/10 to facilitate minimal limitation in daily activity secondary to pain symptoms.    Time 2    Period Weeks    Status New    Target Date 02/10/20      PT LONG TERM GOAL #2   Title Patient  will demonstrate independent use of home exercise program to facilitate ability to maintain/progress functional gains from skilled physical therapy services.    Time 2    Period Weeks    Status New    Target Date 02/10/20                 Plan - 01/30/20 1218    Clinical Impression Statement Continued severe impairment in movement arc related to Lt knee at this time.  Pt. planned to head out of town for next few months.  Pt. to benefit from continued skilled PT services to improve impairments towards improved daily mobility and function.    Personal Factors and Comorbidities Other   length of time since onset, HTN   Examination-Activity Limitations Bend;Lift;Stand;Stairs;Squat;Sit;Transfers;Locomotion Level    Examination-Participation Restrictions Other;Community Activity   community ambulation   Stability/Clinical Decision Making Stable/Uncomplicated    Rehab Potential Fair    PT Frequency --   2x/week   PT Duration 2 weeks    PT Treatment/Interventions ADLs/Self Care Home Management;Cryotherapy;Electrical Stimulation;Spinal Manipulations;Joint Manipulations;Passive range of motion;Dry  needling;Taping;Splinting;Neuromuscular re-education;Patient/family education;Therapeutic exercise;Therapeutic activities;Balance training;Functional mobility training;Stair training;Gait training;Ultrasound;Iontophoresis 47m/ml Dexamethasone;Moist Heat    PT Next Visit Plan MD visit tomorrow. Possible transfer to new clinic [place] of [place]    [place]   Recommended Other Services Skilled PT services in new location when out of town    [employer] Agree with Plan of Care Patient           Patient will benefit from skilled therapeutic intervention in order to improve the following deficits and impairments:  Abnormal gait, Decreased coordination, Decreased range of motion, Difficulty walking, Pain, Decreased activity tolerance, Decreased balance, Hypomobility, Impaired flexibility, Decreased strength, Decreased mobility  Visit Diagnosis: Chronic pain of left knee  Chronic pain of right knee  Muscle weakness (generalized)  Stiffness of left knee, not elsewhere classified  Stiffness of right knee, not elsewhere classified  Difficulty in walking, not elsewhere classified     Problem List Patient Active Problem List   Diagnosis Date Noted  . Unilateral primary osteoarthritis, right knee 11/21/2017  . Status post revision of total knee, left 11/21/2017  . Chronic rhinitis 02/25/2016  . Cough 02/25/2016  . Hoarseness of voice 02/25/2016  . Chronic leukopenia 12/15/2015   MScot Jun PT, DPT, OCS, ATC 01/30/20  12:22 PM  PHYSICAL THERAPY DISCHARGE SUMMARY  Visits from Start of Care: 2  Current functional level related to goals / functional outcomes: See note   Remaining deficits: See note   Education / Equipment: HEP Plan: Patient agrees to discharge.  Patient goals were partially met. Patient is being discharged due to not returning since the last visit.  ?????    MScot Jun PT, DPT, OCS, ATC 04/07/20  10:34 AM     [facility] [address] StreetGCoventry [place] [address]Phone: [phone]  Fax:  [phone] Name: Sonya SPIELBERGMRN: [id number] of Birth: 3June 17, 1942

## 2020-01-31 ENCOUNTER — Encounter: Payer: Self-pay | Admitting: Family

## 2020-01-31 ENCOUNTER — Ambulatory Visit (INDEPENDENT_AMBULATORY_CARE_PROVIDER_SITE_OTHER): Payer: Medicare Other | Admitting: Family

## 2020-01-31 VITALS — Ht 65.0 in | Wt 196.0 lb

## 2020-01-31 DIAGNOSIS — M1712 Unilateral primary osteoarthritis, left knee: Secondary | ICD-10-CM | POA: Diagnosis not present

## 2020-01-31 DIAGNOSIS — M25561 Pain in right knee: Secondary | ICD-10-CM

## 2020-01-31 DIAGNOSIS — M25562 Pain in left knee: Secondary | ICD-10-CM | POA: Diagnosis not present

## 2020-01-31 DIAGNOSIS — M1711 Unilateral primary osteoarthritis, right knee: Secondary | ICD-10-CM | POA: Diagnosis not present

## 2020-01-31 DIAGNOSIS — G8929 Other chronic pain: Secondary | ICD-10-CM | POA: Diagnosis not present

## 2020-01-31 DIAGNOSIS — M545 Low back pain, unspecified: Secondary | ICD-10-CM

## 2020-01-31 MED ORDER — TRAMADOL HCL 50 MG PO TABS: 50.0000 mg | ORAL_TABLET | Freq: Two times a day (BID) | ORAL | 0 refills | Status: DC

## 2020-01-31 NOTE — Progress Notes (Signed)
Office Visit Note   Patient: [name]           Date of Birth: [birth year]           MRN: [id number] Visit Date: 01/31/2020              Requested by: Lucianne Lei, MD [address] [place],  [place] [address] PCP: Lucianne Lei, MD  Chief Complaint  Patient presents with  . Left Knee - Follow-up  . Right Knee - Follow-up    S/p injection 12/2019      HPI: The patient is a 79 year old woman who presents today in follow-up states she will be going out of town today to [place] and will be gone until November.    She was just recently seen July 26 for similar issues.  She is having right greater than left knee pain.  She was status post left knee total knee arthroplasty many many years ago she has had difficulty with range of motion specifically flexion of that surgery just started with physical therapy is pleased with this thus far has only been attending for about 1 week.    Today she is requesting we give her a physical therapy order that she can take and use while she is residing in [place] for the next several months.    Pleased with results of Depo-Medrol injection of the right knee from last visit  Assessment & Plan: Visit Diagnoses:  1. Chronic midline low back pain without sciatica   2. Chronic pain of both knees   3. Primary osteoarthritis of left knee   4. Primary osteoarthritis of right knee     Plan: She will continue physical therapy on the left for quadricep strengthening is much as she can.  Also fall prevention and gait training.  Given an order a hard copy that she can take to physical therapy Follow-Up Instructions: Return if symptoms worsen or fail to improve.   Ortho Exam  Patient is alert, oriented, no adenopathy, well-dressed, normal affect, normal respiratory effort. Focused examination of her right knee demonstrates no effusion.  Mild soft tissue swelling pain with flexion and extension.  No instability findings.  Left knee she is  status post left total knee replacement x2.  She only flexes to about 75 degrees.  She said this is been since her first surgery.  She can extend almost to full extension.  She is able to fire her quad but is very weak no effusion no cellulitis  Imaging: No results found. No images are attached to the encounter.  Labs: No results found for: HGBA1C, ESRSEDRATE, CRP, LABURIC, REPTSTATUS, GRAMSTAIN, CULT, LABORGA   No results found for: ALBUMIN, PREALBUMIN, LABURIC  No results found for: MG No results found for: VD25OH  No results found for: PREALBUMIN CBC EXTENDED Latest Ref Rng & Units 11/09/2005  WBC 3.9 - 10.0 10e3/uL 2.5(L)  RBC 3.70 - 5.32 10e6/uL 4.61  HGB 11.6 - 15.9 g/dL 14.1  HCT 34 - 46 % 41.8  PLT 145 - 400 10e3/uL 174  NEUTROABS 1.5 - 6.5 10e3/uL 0.8(L)  LYMPHSABS 0.9 - 3.3 10e3/uL 1.4     Body mass index is 32.62 kg/m.  Orders:  No orders of the defined types were placed in this encounter.  No orders of the defined types were placed in this encounter.    Procedures: No procedures performed  Clinical Data: No additional findings.  ROS:  All other systems negative, except  as noted in the HPI. Review of Systems  Constitutional: Negative for chills and fever.  Musculoskeletal: Positive for arthralgias, back pain and joint swelling.  Neurological: Negative for weakness and numbness.    Objective: Vital Signs: Ht 5\' 5"  (1.651 m)   Wt 196 lb (88.9 kg)   BMI 32.62 kg/m   Specialty Comments:  No specialty comments available.  PMFS History: Patient Active Problem List   Diagnosis Date Noted  . Unilateral primary osteoarthritis, right knee 11/21/2017  . Status post revision of total knee, left 11/21/2017  . Chronic rhinitis 02/25/2016  . Cough 02/25/2016  . Hoarseness of voice 02/25/2016  . Chronic leukopenia 12/15/2015   Past Medical History:  Diagnosis Date  . Asthma    allergic induced  . Cough   . Hypertension   . Rhinitis     Family  History  Problem Relation Age of Onset  . Other Mother        died when patient was 4 months old  . Hypertension Father   . Cerebral aneurysm Sister   . Breast cancer Sister   . Lung cancer Sister   . Healthy Son   . Allergic rhinitis Neg Hx   . Angioedema Neg Hx   . Asthma Neg Hx   . Eczema Neg Hx   . Immunodeficiency Neg Hx   . Urticaria Neg Hx     Past Surgical History:  Procedure Laterality Date  . ABDOMINAL HYSTERECTOMY    . BACK SURGERY    . CATARACT EXTRACTION    . MASS EXCISION  01/05/2012   Procedure: MINOR EXCISION OF MASS;  Surgeon: Cammie Sickle., MD;  Location: [facility];  Service: Orthopedics;  Laterality: Right;  excision mucoid cyst right long finger, debride DIP joint  . ROTATOR CUFF REPAIR Bilateral   . TOTAL KNEE ARTHROPLASTY Left    twice   Social History   Occupational History  . Occupation: retired    Comment: Warehouse manager  Tobacco Use  . Smoking status: Never Smoker  . Smokeless tobacco: Never Used  Vaping Use  . Vaping Use: Never used  Substance and Sexual Activity  . Alcohol use: No    Alcohol/week: 0.0 standard drinks  . Drug use: No  . Sexual activity: Not on file    Comment: retired from [employer]. 1 son.

## 2020-02-05 ENCOUNTER — Encounter: Payer: Self-pay | Admitting: Family

## 2020-02-05 NOTE — Progress Notes (Signed)
Due to the COVID-19 crisis, this virtual check-in visit was done via telephone from my office and it was initiated and consent given by this patient and or family.  Telephone (Audio) Visit The purpose of this telephone visit is to provide medical care while limiting exposure to the novel coronavirus.    Consent was obtained for telephone visit and initiated by pt/family:  Yes.   Answered questions that patient had about telehealth interaction:  Yes.   I discussed the limitations, risks, security and privacy concerns of performing an evaluation and management service by telephone. I also discussed with the patient that there may be a patient responsible charge related to this service. The patient expressed understanding and agreed to proceed.  Pt location: Home Physician Location: office Name of referring provider:  Lucianne Lei, MD I connected with .[name] at patients initiation/request on 02/07/2020 at  1:00 PM EDT by telephone and verified that I am speaking with the correct person using two identifiers.  Pt MRN:  [id number] Pt DOB:  09-Sep-1940  Assessment/Plan:   1.  Essential tremor  -increase primidone, 50 mg, 3 in the AM, 2 at bed 2.  Leukopenia  -Follows with hematology.  Had bone marrow biopsy, although hematologist feels that this is benign ethnic variant. 3.  Discussed with patient that I really would like to see her at an in person visit, if possible.  If not, told her that I would like to at least see her on video.  Her last several appointments have been phone visits.  She is in [place] right now but will be back after nov.  She agrees.   Subjective:  Patient seen today in follow-up for essential tremor.  I last talked to her in February.  At that point in time, we slightly increased her primidone.  She reports that "I still have the trembles."  Tremor seems to wax and wane.  No SE with medication.  No new medical problems.    Current Movement d/o meds: Primidone, 50  mg, 2 tablets twice per day   Current Outpatient Medications:  .  Ascorbic Acid (VITAMIN C) 250 MG CHEW, Chew 250 mg by mouth., Disp: , Rfl:  .  azelastine (ASTELIN) 0.1 % nasal spray, Place 2 sprays into both nostrils 2 (two) times daily., Disp: 90 mL, Rfl: 1 .  betamethasone dipropionate (DIPROLENE) 0.05 % ointment, , Disp: , Rfl:  .  budesonide-formoterol (SYMBICORT) 160-4.5 MCG/ACT inhaler, Inhale 2 puffs into the lungs 2 (two) times daily., Disp: 1 Inhaler, Rfl: 5 .  CALCIUM-MAGNESIUM-ZINC PO, Take by mouth., Disp: , Rfl:  .  Carbinoxamine Maleate (RYVENT) 6 MG TABS, Take 1 tablet by mouth every 8 (eight) hours., Disp: 120 tablet, Rfl: 5 .  cholecalciferol (VITAMIN D3) 25 MCG (1000 UT) tablet, Take 1,000 Units by mouth daily., Disp: , Rfl:  .  clindamycin (CLEOCIN-T) 1 % lotion, , Disp: , Rfl:  .  clobetasol cream (TEMOVATE) 0.05 %, , Disp: , Rfl:  .  Colesevelam HCl (WELCHOL) 3.75 g PACK, , Disp: , Rfl:  .  Diclofenac Sodium (PENNSAID) 1.5 % SOLN, Place onto the skin. Reported on 08/26/2015, Disp: , Rfl:  .  diclofenac sodium (VOLTAREN) 1 % GEL, , Disp: , Rfl:  .  docusate sodium (COLACE) 100 MG capsule, Take 100 mg by mouth daily as needed for mild constipation., Disp: , Rfl:  .  ipratropium (ATROVENT) 0.06 % nasal spray, Place 2 sprays into both nostrils 3 (three) times daily.,  Disp: 15 mL, Rfl: 5 .  levalbuterol (XOPENEX HFA) 45 MCG/ACT inhaler, Inhale 2 puffs into the lungs every 4 (four) hours as needed., Disp: 1 Inhaler, Rfl: 1 .  mometasone (ELOCON) 0.1 % cream, Apply 1 application topically daily., Disp: , Rfl:  .  montelukast (SINGULAIR) 10 MG tablet, Take 1 tablet (10 mg total) by mouth at bedtime., Disp: 90 tablet, Rfl: 1 .  multivitamin-iron-minerals-folic acid (CENTRUM) chewable tablet, Chew 1 tablet by mouth daily., Disp: , Rfl:  .  niacin (NIASPAN) 500 MG CR tablet, Take 500 mg by mouth at bedtime., Disp: , Rfl:  .  olmesartan-hydrochlorothiazide (BENICAR HCT) 20-12.5 MG per  tablet, Take 1 tablet by mouth daily., Disp: , Rfl:  .  Oxymetazoline HCl (QC NASAL SPRAY NA), Place into the nose., Disp: , Rfl:  .  Pitavastatin Calcium (LIVALO) 4 MG TABS, Take by mouth., Disp: , Rfl:  .  primidone (MYSOLINE) 50 MG tablet, Take 2 tablets (100 mg total) by mouth 2 (two) times daily., Disp: 360 tablet, Rfl: 1 .  traMADol (ULTRAM) 50 MG tablet, Take 1 tablet (50 mg total) by mouth 2 (two) times daily., Disp: 20 tablet, Rfl: 0    Objective:   Vitals:   02/07/20 1257  BP: 127/72  Weight: 185 lb (83.9 kg)  Height: '5\' 5"'  (1.651 m)     Follow Up Instructions:      -I discussed the assessment and treatment plan with the patient. The patient was provided an opportunity to ask questions and all were answered. The patient agreed with the plan and demonstrated an understanding of the instructions.   The patient was advised to call back or seek an in-person evaluation if the symptoms worsen or if the condition fails to improve as anticipated.    Total Time spent in visit with the patient was:  12 min, of which 100% of the time was spent in counseling .   Pt understands and agrees with the plan of care outlined.     Alonza Bogus, DO

## 2020-02-07 ENCOUNTER — Encounter: Payer: Self-pay | Admitting: Neurology

## 2020-02-07 ENCOUNTER — Telehealth (INDEPENDENT_AMBULATORY_CARE_PROVIDER_SITE_OTHER): Payer: Medicare Other | Admitting: Neurology

## 2020-02-07 ENCOUNTER — Other Ambulatory Visit: Payer: Self-pay

## 2020-02-07 VITALS — BP 127/72 | Ht 65.0 in | Wt 185.0 lb

## 2020-02-07 DIAGNOSIS — G250 Essential tremor: Secondary | ICD-10-CM

## 2020-02-07 MED ORDER — PRIMIDONE 50 MG PO TABS: ORAL_TABLET | ORAL | 1 refills | Status: DC

## 2020-02-12 ENCOUNTER — Telehealth: Payer: Self-pay

## 2020-02-12 ENCOUNTER — Telehealth: Payer: Self-pay | Admitting: Family

## 2020-02-12 DIAGNOSIS — M25662 Stiffness of left knee, not elsewhere classified: Secondary | ICD-10-CM | POA: Diagnosis not present

## 2020-02-12 DIAGNOSIS — M25562 Pain in left knee: Secondary | ICD-10-CM | POA: Diagnosis not present

## 2020-02-12 DIAGNOSIS — R262 Difficulty in walking, not elsewhere classified: Secondary | ICD-10-CM | POA: Diagnosis not present

## 2020-02-12 NOTE — Telephone Encounter (Signed)
Please see message below. Ok to write order pt last in office 01/31/20 for LBP

## 2020-02-12 NOTE — Telephone Encounter (Signed)
[name] from [facility] in [place] called requesting a call back about verbal orders. [name] states patient is a patient of [name] and patient for now is in [place] with son. Patient stated to [name] that she needs outpatient PT. [name] is asking to fax over verbal orders if possible for outpatient pt for pt of knees. [name] phone number is [phone] and fax is [phone].

## 2020-02-12 NOTE — Telephone Encounter (Signed)
Kennyth Lose, PT with [facility] would like last office note faxed to [phone]?  CB# [phone].  Please advise.  Thank you.

## 2020-02-13 NOTE — Telephone Encounter (Signed)
Last OV note faxed as requested.

## 2020-02-18 DIAGNOSIS — E785 Hyperlipidemia, unspecified: Secondary | ICD-10-CM | POA: Diagnosis not present

## 2020-02-18 DIAGNOSIS — G252 Other specified forms of tremor: Secondary | ICD-10-CM | POA: Diagnosis not present

## 2020-02-18 DIAGNOSIS — M25562 Pain in left knee: Secondary | ICD-10-CM | POA: Diagnosis not present

## 2020-02-18 DIAGNOSIS — E1100 Type 2 diabetes mellitus with hyperosmolarity without nonketotic hyperglycemic-hyperosmolar coma (NKHHC): Secondary | ICD-10-CM | POA: Diagnosis not present

## 2020-02-18 DIAGNOSIS — R262 Difficulty in walking, not elsewhere classified: Secondary | ICD-10-CM | POA: Diagnosis not present

## 2020-02-18 DIAGNOSIS — M25662 Stiffness of left knee, not elsewhere classified: Secondary | ICD-10-CM | POA: Diagnosis not present

## 2020-02-18 DIAGNOSIS — I10 Essential (primary) hypertension: Secondary | ICD-10-CM | POA: Diagnosis not present

## 2020-02-20 DIAGNOSIS — M25662 Stiffness of left knee, not elsewhere classified: Secondary | ICD-10-CM | POA: Diagnosis not present

## 2020-02-20 DIAGNOSIS — R262 Difficulty in walking, not elsewhere classified: Secondary | ICD-10-CM | POA: Diagnosis not present

## 2020-02-20 DIAGNOSIS — M25562 Pain in left knee: Secondary | ICD-10-CM | POA: Diagnosis not present

## 2020-02-25 DIAGNOSIS — R262 Difficulty in walking, not elsewhere classified: Secondary | ICD-10-CM | POA: Diagnosis not present

## 2020-02-25 DIAGNOSIS — M25662 Stiffness of left knee, not elsewhere classified: Secondary | ICD-10-CM | POA: Diagnosis not present

## 2020-02-25 DIAGNOSIS — M25562 Pain in left knee: Secondary | ICD-10-CM | POA: Diagnosis not present

## 2020-02-26 ENCOUNTER — Ambulatory Visit (INDEPENDENT_AMBULATORY_CARE_PROVIDER_SITE_OTHER): Payer: Medicare Other | Admitting: Allergy

## 2020-02-26 ENCOUNTER — Encounter: Payer: Self-pay | Admitting: Allergy

## 2020-02-26 ENCOUNTER — Other Ambulatory Visit: Payer: Self-pay

## 2020-02-26 VITALS — BP 128/74 | Wt 182.5 lb

## 2020-02-26 DIAGNOSIS — J310 Chronic rhinitis: Secondary | ICD-10-CM

## 2020-02-26 DIAGNOSIS — J4520 Mild intermittent asthma, uncomplicated: Secondary | ICD-10-CM

## 2020-02-26 DIAGNOSIS — R059 Cough, unspecified: Secondary | ICD-10-CM

## 2020-02-26 DIAGNOSIS — R05 Cough: Secondary | ICD-10-CM | POA: Diagnosis not present

## 2020-02-26 MED ORDER — CARBINOXAMINE MALEATE 6 MG PO TABS: 1.0000 | ORAL_TABLET | Freq: Three times a day (TID) | ORAL | 5 refills | Status: DC

## 2020-02-26 MED ORDER — LEVALBUTEROL TARTRATE 45 MCG/ACT IN AERO: 2.0000 | INHALATION_SPRAY | RESPIRATORY_TRACT | 1 refills | Status: DC | PRN

## 2020-02-26 MED ORDER — BUDESONIDE-FORMOTEROL FUMARATE 160-4.5 MCG/ACT IN AERO: 2.0000 | INHALATION_SPRAY | Freq: Two times a day (BID) | RESPIRATORY_TRACT | 5 refills | Status: DC

## 2020-02-26 NOTE — Patient Instructions (Addendum)
Cough and rhinitis  - post nasal drip leading to throat irritation and cough.    - continue nasal antihistamine Astelin 2 sprays each nostril 1-2 times a day for nasal drainage as needed  -Continue nasal atrovent 0.06% 2 sprays each nostril as needed for nasal drainage control.   Can be used up to 3-4 times a day if needed  - continue use of saline wash prior to using your nasal spray  - continue singulair 10mg  at bedtime  - use Xopenex 2 inhalations every 4-6 hours as needed for cough/wheeze/shortness of breath/chest tightness.  This is a quick relief medication.    -Use Symbicort 153mcg 2 puffs twice a day every day regardless of how you are feeling. This is your maintenance inhaler medication   - continue RyVent 6mg  and may take up to twice a day to help with nasal drainage.    Follow-up 6 months or sooner if needed

## 2020-02-26 NOTE — Progress Notes (Signed)
RE: [name] MRN: [id number] DOB: [birth year] Date of Telemedicine Visit: 02/26/2020  Referring provider: Lucianne Lei, MD Primary care provider: Lucianne Lei, MD  Chief Complaint: Asthma (Things are well. No questions or concerns currently) and Allergic Rhinitis  (Nasal drip, hoarseness at times. No questions or concerns currently.)   Telemedicine Follow Up Visit via Telephone: I connected with [name] for a follow up on 02/26/20 by telephone and verified that I am speaking with the correct person using two identifiers.   I discussed the limitations, risks, security and privacy concerns of performing an evaluation and management service by telephone and the availability of in person appointments. I also discussed with the patient that there may be a patient responsible charge related to this service. The patient expressed understanding and agreed to proceed.  Patient is at vacation home. Provider is at the office.  Visit start time: 6222 Visit end time: [place] consent/check in by: Anderson Malta T Medical consent and medical assistant/nurse: Cree K  History of Present Illness: She is a 79 y.o. female, who is being followed for cough and rhinitis. Her previous allergy office visit was on 08/21/19 with Dr. Nelva Bush.  She states she is currently on vacation and will not return back home until November. She is fully vaccinated with the Moderna vaccine. She states she still does have occasional cough secondary to food postnasal drip but she feels that the medications are still very helpful. She uses the nasal Atrovent twice a day and she states it works well. She uses nasal Astelin as needed 3 times a week on average. She continues to take Singulair daily as well as RyVent twice a day. She does feel that the RyVent is helping. She states she is needing to use her Xopenex almost daily but also notes she is not using the Symbicort every day as directed. She however denies any ED or  urgent care visits or systemic steroids.  Assessment and Plan: [name] is a 79 y.o. female with:   Cough and rhinitis  - post nasal drip leading to throat irritation and cough.    - continue nasal antihistamine Astelin 2 sprays each nostril 1-2 times a day for nasal drainage as needed  -Continue nasal atrovent 0.06% 2 sprays each nostril as needed for nasal drainage control.   Can be used up to 3-4 times a day if needed  - continue use of saline wash prior to using your nasal spray  - continue singulair 10mg  at bedtime  - use Xopenex 2 inhalations every 4-6 hours as needed for cough/wheeze/shortness of breath/chest tightness.  This is a quick relief medication.    -Use Symbicort 17mcg 2 puffs twice a day every day regardless of how you are feeling. This is your maintenance inhaler medication   - continue RyVent 6mg  and may take up to twice a day to help with nasal drainage.    Follow-up 6 months or sooner if needed  Diagnostics: ACT score 20  Medication List:  Current Outpatient Medications  Medication Sig Dispense Refill  . Ascorbic Acid (VITAMIN C) 250 MG CHEW Chew 250 mg by mouth.    Marland Kitchen azelastine (ASTELIN) 0.1 % nasal spray Place 2 sprays into both nostrils 2 (two) times daily. 90 mL 1  . budesonide-formoterol (SYMBICORT) 160-4.5 MCG/ACT inhaler Inhale 2 puffs into the lungs 2 (two) times daily. 1 Inhaler 5  . CALCIUM-MAGNESIUM-ZINC PO Take by mouth.    . Carbinoxamine Maleate (RYVENT) 6 MG TABS Take 1 tablet  by mouth every 8 (eight) hours. 120 tablet 5  . clindamycin (CLEOCIN-T) 1 % lotion     . clobetasol cream (TEMOVATE) 0.05 %     . Diclofenac Sodium (PENNSAID) 1.5 % SOLN Place onto the skin. Reported on 08/26/2015    . diclofenac sodium (VOLTAREN) 1 % GEL     . docusate sodium (COLACE) 100 MG capsule Take 100 mg by mouth daily as needed for mild constipation.    Marland Kitchen ipratropium (ATROVENT) 0.06 % nasal spray Place 2 sprays into both nostrils 3 (three) times daily. 15 mL 5  .  levalbuterol (XOPENEX HFA) 45 MCG/ACT inhaler Inhale 2 puffs into the lungs every 4 (four) hours as needed. 1 Inhaler 1  . mometasone (ELOCON) 0.1 % cream Apply 1 application topically daily.    . montelukast (SINGULAIR) 10 MG tablet Take 1 tablet (10 mg total) by mouth at bedtime. 90 tablet 1  . multivitamin-iron-minerals-folic acid (CENTRUM) chewable tablet Chew 1 tablet by mouth daily.    Marland Kitchen olmesartan-hydrochlorothiazide (BENICAR HCT) 20-12.5 MG per tablet Take 1 tablet by mouth daily.    . Pitavastatin Calcium (LIVALO) 4 MG TABS Take by mouth.    . primidone (MYSOLINE) 50 MG tablet 3 in the AM, 2 at bed 450 tablet 1  . traMADol (ULTRAM) 50 MG tablet Take 1 tablet (50 mg total) by mouth 2 (two) times daily. 20 tablet 0  . betamethasone dipropionate (DIPROLENE) 0.05 % ointment  (Patient not taking: Reported on 02/26/2020)    . cholecalciferol (VITAMIN D3) 25 MCG (1000 UT) tablet Take 1,000 Units by mouth daily. (Patient not taking: Reported on 02/26/2020)    . Colesevelam HCl (WELCHOL) 3.75 g PACK  (Patient not taking: Reported on 02/26/2020)    . niacin (NIASPAN) 500 MG CR tablet Take 500 mg by mouth at bedtime. (Patient not taking: Reported on 02/26/2020)    . Oxymetazoline HCl (QC NASAL SPRAY NA) Place into the nose. (Patient not taking: Reported on 02/26/2020)     No current facility-administered medications for this visit.   Allergies: Allergies  Allergen Reactions  . Latex Shortness Of Breath   I reviewed her past medical history, social history, family history, and environmental history and no significant changes have been reported from previous visit on 08/21/19.  Review of Systems  Constitutional: Negative.   HENT: Positive for postnasal drip.   Eyes: Negative.   Respiratory: Positive for cough.   Cardiovascular: Negative.   Gastrointestinal: Negative.   Musculoskeletal: Negative.   Skin: Negative.   Neurological: Negative.    Objective: Physical Exam Not obtained as encounter was  done via telephone.   Previous notes and tests were reviewed.  I discussed the assessment and treatment plan with the patient. The patient was provided an opportunity to ask questions and all were answered. The patient agreed with the plan and demonstrated an understanding of the instructions.   The patient was advised to call back or seek an in-person evaluation if the symptoms worsen or if the condition fails to improve as anticipated.  I provided 26 minutes of non-face-to-face time during this encounter.  It was my pleasure to participate in [place] care today. Please feel free to contact me with any questions or concerns.   Sincerely,  Rudell Marlowe Charmian Muff, MD

## 2020-02-27 ENCOUNTER — Other Ambulatory Visit: Payer: Self-pay | Admitting: *Deleted

## 2020-02-27 ENCOUNTER — Telehealth: Payer: Self-pay | Admitting: *Deleted

## 2020-02-27 DIAGNOSIS — M25562 Pain in left knee: Secondary | ICD-10-CM | POA: Diagnosis not present

## 2020-02-27 DIAGNOSIS — M25662 Stiffness of left knee, not elsewhere classified: Secondary | ICD-10-CM | POA: Diagnosis not present

## 2020-02-27 DIAGNOSIS — R262 Difficulty in walking, not elsewhere classified: Secondary | ICD-10-CM | POA: Diagnosis not present

## 2020-02-27 NOTE — Telephone Encounter (Signed)
Received a fax from the pharmacy stating that Levalbuterol is preferred but requires a step therapy of trying and failing Albuterol first. I did not see Albuterol in her past medication history. Has she tried and failed Albuterol?

## 2020-02-27 NOTE — Telephone Encounter (Signed)
Called and spoke to patient and she states that she was on albuterol a long time ago but she does not remember why she was changed to Levalbuterol.

## 2020-02-27 NOTE — Telephone Encounter (Signed)
Called and left message for patient to call back.

## 2020-02-27 NOTE — Telephone Encounter (Signed)
I do not know the answer to this.   She was already on xopenex when she was seeing Dr. Ishmael Holter.  Thus it has been her rescue inhaler of choice and I continued it.   I think the best plan forward is to call her and see if she can remember why xopenex was initially done instead of albuterol or if she remembers using albuterol and developing symptoms like increase anxiousness, jitteriness, fast heartbeat etc.

## 2020-02-27 NOTE — Telephone Encounter (Signed)
Patient called back.  Please call back @ [phone]

## 2020-02-28 ENCOUNTER — Other Ambulatory Visit: Payer: Self-pay | Admitting: *Deleted

## 2020-02-28 MED ORDER — VENTOLIN HFA 108 (90 BASE) MCG/ACT IN AERS
2.0000 | INHALATION_SPRAY | Freq: Four times a day (QID) | RESPIRATORY_TRACT | 1 refills | Status: DC | PRN
Start: 2020-02-28 — End: 2020-02-28

## 2020-02-28 MED ORDER — VENTOLIN HFA 108 (90 BASE) MCG/ACT IN AERS: 2.0000 | INHALATION_SPRAY | Freq: Four times a day (QID) | RESPIRATORY_TRACT | 1 refills | Status: DC | PRN

## 2020-02-28 NOTE — Telephone Encounter (Signed)
Do you recall any history as to why the patient is on Xopenex and not Albuterol? Just wanted to make sure before sending in Albuterol.

## 2020-02-28 NOTE — Telephone Encounter (Signed)
Patient call back and was advised. Patient needed the medication to be sent to the pharmacy on file to [place]. Prescription has been sent to pharmacy in [place]. Patient verbalized understanding.

## 2020-02-28 NOTE — Telephone Encounter (Signed)
Prescription for Ventolin has been sent in. Called patient and left a voicemail asking to return call to advise.

## 2020-02-28 NOTE — Telephone Encounter (Signed)
No I do not know.    Thus let's just fill it as albuterol and if she has side effects related to use then we can resume Xopenex and documented need for change.   Please let pt know this is an insurance change and if she notes any side effects after albuterol use to let us know.

## 2020-03-03 DIAGNOSIS — M25662 Stiffness of left knee, not elsewhere classified: Secondary | ICD-10-CM | POA: Diagnosis not present

## 2020-03-03 DIAGNOSIS — R262 Difficulty in walking, not elsewhere classified: Secondary | ICD-10-CM | POA: Diagnosis not present

## 2020-03-03 DIAGNOSIS — M25562 Pain in left knee: Secondary | ICD-10-CM | POA: Diagnosis not present

## 2020-03-05 DIAGNOSIS — R262 Difficulty in walking, not elsewhere classified: Secondary | ICD-10-CM | POA: Diagnosis not present

## 2020-03-05 DIAGNOSIS — M25662 Stiffness of left knee, not elsewhere classified: Secondary | ICD-10-CM | POA: Diagnosis not present

## 2020-03-05 DIAGNOSIS — M25562 Pain in left knee: Secondary | ICD-10-CM | POA: Diagnosis not present

## 2020-03-10 DIAGNOSIS — R262 Difficulty in walking, not elsewhere classified: Secondary | ICD-10-CM | POA: Diagnosis not present

## 2020-03-10 DIAGNOSIS — M25662 Stiffness of left knee, not elsewhere classified: Secondary | ICD-10-CM | POA: Diagnosis not present

## 2020-03-10 DIAGNOSIS — M25562 Pain in left knee: Secondary | ICD-10-CM | POA: Diagnosis not present

## 2020-03-12 DIAGNOSIS — R262 Difficulty in walking, not elsewhere classified: Secondary | ICD-10-CM | POA: Diagnosis not present

## 2020-03-12 DIAGNOSIS — M25662 Stiffness of left knee, not elsewhere classified: Secondary | ICD-10-CM | POA: Diagnosis not present

## 2020-03-12 DIAGNOSIS — M25562 Pain in left knee: Secondary | ICD-10-CM | POA: Diagnosis not present

## 2020-03-17 DIAGNOSIS — M25562 Pain in left knee: Secondary | ICD-10-CM | POA: Diagnosis not present

## 2020-03-17 DIAGNOSIS — M25662 Stiffness of left knee, not elsewhere classified: Secondary | ICD-10-CM | POA: Diagnosis not present

## 2020-03-17 DIAGNOSIS — R262 Difficulty in walking, not elsewhere classified: Secondary | ICD-10-CM | POA: Diagnosis not present

## 2020-03-20 DIAGNOSIS — Z23 Encounter for immunization: Secondary | ICD-10-CM | POA: Diagnosis not present

## 2020-03-24 DIAGNOSIS — M25662 Stiffness of left knee, not elsewhere classified: Secondary | ICD-10-CM | POA: Diagnosis not present

## 2020-03-24 DIAGNOSIS — R262 Difficulty in walking, not elsewhere classified: Secondary | ICD-10-CM | POA: Diagnosis not present

## 2020-03-24 DIAGNOSIS — M25562 Pain in left knee: Secondary | ICD-10-CM | POA: Diagnosis not present

## 2020-03-26 DIAGNOSIS — M25562 Pain in left knee: Secondary | ICD-10-CM | POA: Diagnosis not present

## 2020-03-26 DIAGNOSIS — R262 Difficulty in walking, not elsewhere classified: Secondary | ICD-10-CM | POA: Diagnosis not present

## 2020-03-26 DIAGNOSIS — M25662 Stiffness of left knee, not elsewhere classified: Secondary | ICD-10-CM | POA: Diagnosis not present

## 2020-03-31 DIAGNOSIS — R262 Difficulty in walking, not elsewhere classified: Secondary | ICD-10-CM | POA: Diagnosis not present

## 2020-03-31 DIAGNOSIS — M25662 Stiffness of left knee, not elsewhere classified: Secondary | ICD-10-CM | POA: Diagnosis not present

## 2020-03-31 DIAGNOSIS — M25562 Pain in left knee: Secondary | ICD-10-CM | POA: Diagnosis not present

## 2020-04-02 DIAGNOSIS — R262 Difficulty in walking, not elsewhere classified: Secondary | ICD-10-CM | POA: Diagnosis not present

## 2020-04-02 DIAGNOSIS — M25662 Stiffness of left knee, not elsewhere classified: Secondary | ICD-10-CM | POA: Diagnosis not present

## 2020-04-02 DIAGNOSIS — M25562 Pain in left knee: Secondary | ICD-10-CM | POA: Diagnosis not present

## 2020-04-06 ENCOUNTER — Telehealth: Payer: Self-pay | Admitting: Neurology

## 2020-04-06 MED ORDER — PRIMIDONE 50 MG PO TABS: ORAL_TABLET | ORAL | 0 refills | Status: DC

## 2020-04-06 NOTE — Telephone Encounter (Signed)
Contacted pharmacy to see if patient and any refills left on file and if so could they refill her medication. He stated the patient does not have any more refills left. Informed him that we would send the rx to the local pharmacy and the patient can pick it up there. He voiced understanding.   Rx(s) sent to pharmacy electronically.  Patient notified and voiced understanding.

## 2020-04-06 NOTE — Telephone Encounter (Signed)
She fills at a local [facility] and they should be able to transfer.  If not, you can call in a week supply.

## 2020-04-06 NOTE — Telephone Encounter (Signed)
Patient called and said she is traveling and is now out of her primidone 50 MG. She's requesting refills to be sent to a local pharmacy below.  [facility] in [place], [place], phone

## 2020-04-06 NOTE — Telephone Encounter (Signed)
Is this ok?

## 2020-04-07 ENCOUNTER — Telehealth: Payer: Self-pay | Admitting: Neurology

## 2020-04-07 DIAGNOSIS — M25562 Pain in left knee: Secondary | ICD-10-CM | POA: Diagnosis not present

## 2020-04-07 DIAGNOSIS — R262 Difficulty in walking, not elsewhere classified: Secondary | ICD-10-CM | POA: Diagnosis not present

## 2020-04-07 DIAGNOSIS — M25662 Stiffness of left knee, not elsewhere classified: Secondary | ICD-10-CM | POA: Diagnosis not present

## 2020-04-08 ENCOUNTER — Other Ambulatory Visit: Payer: Self-pay | Admitting: Neurology

## 2020-04-09 DIAGNOSIS — M25662 Stiffness of left knee, not elsewhere classified: Secondary | ICD-10-CM | POA: Diagnosis not present

## 2020-04-09 DIAGNOSIS — M25562 Pain in left knee: Secondary | ICD-10-CM | POA: Diagnosis not present

## 2020-04-09 DIAGNOSIS — R262 Difficulty in walking, not elsewhere classified: Secondary | ICD-10-CM | POA: Diagnosis not present

## 2020-04-13 DIAGNOSIS — M25562 Pain in left knee: Secondary | ICD-10-CM | POA: Diagnosis not present

## 2020-04-13 DIAGNOSIS — M25662 Stiffness of left knee, not elsewhere classified: Secondary | ICD-10-CM | POA: Diagnosis not present

## 2020-04-13 DIAGNOSIS — R262 Difficulty in walking, not elsewhere classified: Secondary | ICD-10-CM | POA: Diagnosis not present

## 2020-04-18 DIAGNOSIS — Z23 Encounter for immunization: Secondary | ICD-10-CM | POA: Diagnosis not present

## 2020-05-04 DIAGNOSIS — E1100 Type 2 diabetes mellitus with hyperosmolarity without nonketotic hyperglycemic-hyperosmolar coma (NKHHC): Secondary | ICD-10-CM | POA: Diagnosis not present

## 2020-05-04 DIAGNOSIS — I10 Essential (primary) hypertension: Secondary | ICD-10-CM | POA: Diagnosis not present

## 2020-05-04 DIAGNOSIS — E785 Hyperlipidemia, unspecified: Secondary | ICD-10-CM | POA: Diagnosis not present

## 2020-05-05 DIAGNOSIS — M130 Polyarthritis, unspecified: Secondary | ICD-10-CM | POA: Diagnosis not present

## 2020-05-05 DIAGNOSIS — L308 Other specified dermatitis: Secondary | ICD-10-CM | POA: Diagnosis not present

## 2020-05-05 DIAGNOSIS — I10 Essential (primary) hypertension: Secondary | ICD-10-CM | POA: Diagnosis not present

## 2020-05-05 DIAGNOSIS — E1169 Type 2 diabetes mellitus with other specified complication: Secondary | ICD-10-CM | POA: Diagnosis not present

## 2020-05-07 DIAGNOSIS — L810 Postinflammatory hyperpigmentation: Secondary | ICD-10-CM | POA: Diagnosis not present

## 2020-05-07 DIAGNOSIS — L720 Epidermal cyst: Secondary | ICD-10-CM | POA: Diagnosis not present

## 2020-06-01 ENCOUNTER — Telehealth: Payer: Self-pay | Admitting: Neurology

## 2020-06-01 MED ORDER — PRIMIDONE 50 MG PO TABS: ORAL_TABLET | ORAL | 2 refills | Status: DC

## 2020-06-01 NOTE — Telephone Encounter (Signed)
Patient called in needing to get a refill on her primidone sent to [facility] on [address]

## 2020-06-01 NOTE — Telephone Encounter (Signed)
Patient notified directly and voiced understanding.   Rx(s) sent to pharmacy electronically.

## 2020-06-02 DIAGNOSIS — H35033 Hypertensive retinopathy, bilateral: Secondary | ICD-10-CM | POA: Diagnosis not present

## 2020-06-16 DIAGNOSIS — Z86010 Personal history of colonic polyps: Secondary | ICD-10-CM | POA: Diagnosis not present

## 2020-06-16 DIAGNOSIS — R140 Abdominal distension (gaseous): Secondary | ICD-10-CM | POA: Diagnosis not present

## 2020-06-16 DIAGNOSIS — R1033 Periumbilical pain: Secondary | ICD-10-CM | POA: Diagnosis not present

## 2020-06-16 DIAGNOSIS — K5730 Diverticulosis of large intestine without perforation or abscess without bleeding: Secondary | ICD-10-CM | POA: Diagnosis not present

## 2020-07-13 ENCOUNTER — Other Ambulatory Visit: Payer: Self-pay | Admitting: Allergy & Immunology

## 2020-07-14 DIAGNOSIS — H6123 Impacted cerumen, bilateral: Secondary | ICD-10-CM | POA: Diagnosis not present

## 2020-07-29 ENCOUNTER — Other Ambulatory Visit: Payer: Self-pay

## 2020-07-29 ENCOUNTER — Encounter: Payer: Self-pay | Admitting: Family Medicine

## 2020-07-29 ENCOUNTER — Ambulatory Visit (INDEPENDENT_AMBULATORY_CARE_PROVIDER_SITE_OTHER): Payer: Medicare Other | Admitting: Family Medicine

## 2020-07-29 VITALS — BP 124/76 | HR 78 | Temp 97.6°F | Resp 18 | Ht 65.5 in | Wt 177.4 lb

## 2020-07-29 DIAGNOSIS — J302 Other seasonal allergic rhinitis: Secondary | ICD-10-CM | POA: Diagnosis not present

## 2020-07-29 DIAGNOSIS — J4540 Moderate persistent asthma, uncomplicated: Secondary | ICD-10-CM

## 2020-07-29 DIAGNOSIS — J45909 Unspecified asthma, uncomplicated: Secondary | ICD-10-CM | POA: Insufficient documentation

## 2020-07-29 DIAGNOSIS — J3089 Other allergic rhinitis: Secondary | ICD-10-CM

## 2020-07-29 DIAGNOSIS — R059 Cough, unspecified: Secondary | ICD-10-CM

## 2020-07-29 DIAGNOSIS — K219 Gastro-esophageal reflux disease without esophagitis: Secondary | ICD-10-CM

## 2020-07-29 MED ORDER — FLUTICASONE PROPIONATE 50 MCG/ACT NA SUSP: 2.0000 | Freq: Every day | NASAL | 5 refills | Status: DC

## 2020-07-29 NOTE — Patient Instructions (Addendum)
Cough Continue montelukast 10 mg once a day to prevent cough or wheeze Continue Symbicort 160-2 puffs once a day to prevent cough or wheeze Continue Xopenex 2 puffs once every 4-6 hours as needed for cough or wheeze For asthma flare, increase Symbicort 160 to 2 puffs twice a day with a spacer for 2 weeks or when cough and wheeze free.  Then return to previous dosing  Allergic rhinitis Begin Flonase 2 sprays in each nostril once a day as needed for nasal symptoms.  In the right nostril, point the applicator out toward the right ear. In the left nostril, point the applicator out toward the left ear Continue azelastine 2 sprays in each nostril twice a day as needed for nasal symptoms Continue ipratropium 2 sprays in each nostril up to three times a day as needed for a runny nose Continuesaline nasal rinses as needed for nasal symptoms. Use this before any medicated nasal sprays for best result Continue allergen avoidance measures Continue RyVent 6 mg twice a day to control nasal symptoms  Reflux Begin dietary and lifestyle modifications as listed below We will consider adding medication for reflux if no improvement in cough after beginning the treatment plans as listed above  Call the clinic if this treatment plan is not working well for you  Follow up in 6 months or sooner if needed.   Lifestyle Changes for Controlling GERD When you have GERD, stomach acid feels as if it's backing up toward your mouth. Whether or not you take medication to control your GERD, your symptoms can often be improved with lifestyle changes.   Raise Your Head  Reflux is more likely to strike when you're lying down flat, because stomach fluid can  flow backward more easily. Raising the head of your bed 4-6 inches can help. To do this:  Slide blocks or books under the legs at the head of your bed. Or, place a wedge under  the mattress. Many foam stores can make a suitable wedge for you. The wedge  should  run from your waist to the top of your head.  Don't just prop your head on several pillows. This increases pressure on your  stomach. It can make GERD worse.  Watch Your Eating Habits Certain foods may increase the acid in your stomach or relax the lower esophageal sphincter, making GERD more likely. It's best to avoid the following:  Coffee, tea, and carbonated drinks (with and without caffeine)  Fatty, fried, or spicy food  Mint, chocolate, onions, and tomatoes  Any other foods that seem to irritate your stomach or cause you pain  Relieve the Pressure  Eat smaller meals, even if you have to eat more often.  Don't lie down right after you eat. Wait a few hours for your stomach to empty.  Avoid tight belts and tight-fitting clothes.  Lose excess weight.  Tobacco and Alcohol  Avoid smoking tobacco and drinking alcohol. They can make GERD symptoms worse.

## 2020-07-29 NOTE — Progress Notes (Signed)
[facility] [address] Dept: [phone]  FOLLOW UP NOTE  Patient ID: [name], female    DOB: 01-31-1941  Age: 80 y.o. MRN: [id number] Date of Office Visit: 07/29/2020  Assessment  Chief Complaint: Allergic Rhinitis  (Light headaches, runny nose, and drainage down throat in the morning yellow mucus /Dry crusty yellow mucus when she blows her nose )  HPI [name] is a 80 year old female who presents the clinic for follow-up visit.  She was last seen in this clinic on 02/26/2020 by Dr. Nelva Bush for evaluation of cough and allergic rhinitis.  At today's visit, she reports allergic rhinitis has been not well controlled for the last 3 months with symptoms including clear rhinorrhea, intermittent headache, and thick postnasal drainage occurring especially in the morning and continuing throughout the day.  She reports that some days nasal drainage is light yellow before returning back to clear.  She reports her symptoms are generally worse in the spring and fall, however, she has been experiencing similar symptoms this winter.  She continues RyVent 6 mg twice a day, saline nasal rinses once a day, azelastine 2 sprays in each nostril twice a day, and Atrovent nasal spray about 2-3 times a week.  Asthma is reported as moderately well controlled with occasional shortness of breath occurring at nighttime when she has a lot of mucus in her throat, no wheeze, and cough occasionally which is caused by mucus buildup.  She continues Symbicort 160 as needed about 3 days a week, montelukast 10 mg once a day, and Xopenex about 1-2 times a week for cough occurring at night with resolution of symptoms.  She denies reflux including symptoms of heartburn or vomiting and is not currently taking a reflux medication.  Her current medications are listed in the chart.  Her last skin testing was from her previous allergist in [place] in 2005 and indicates reactivity to pollens, pets, mold,  and feather.   Drug Allergies:  Allergies  Allergen Reactions  . Latex Shortness Of Breath and Other (See Comments)    Physical Exam: BP 124/76   Pulse 78   Temp 97.6 F (36.4 C)   Resp 18   Ht 5' 5.5" (1.664 m)   Wt 177 lb 6.4 oz (80.5 kg)   SpO2 97%   BMI 29.07 kg/m    Physical Exam Vitals reviewed.  Constitutional:      Appearance: Normal appearance.  HENT:     Head: Normocephalic and atraumatic.     Right Ear: Tympanic membrane normal.     Left Ear: Tympanic membrane normal.     Nose:     Comments: Bilateral nares edematous and pale with clear nasal drainage noted.  Pharynx slightly erythematous with no exudate.  Ears normal.  Eyes normal. Eyes:     Conjunctiva/sclera: Conjunctivae normal.  Cardiovascular:     Rate and Rhythm: Normal rate and regular rhythm.     Heart sounds: Normal heart sounds. No murmur heard.   Pulmonary:     Effort: Pulmonary effort is normal.     Breath sounds: Normal breath sounds.     Comments: Lungs clear to auscultation Musculoskeletal:        General: Normal range of motion.     Cervical back: Normal range of motion and neck supple.  Skin:    General: Skin is warm and dry.  Neurological:     Mental Status: She is alert and oriented to person, place, and time.  Psychiatric:        Mood and Affect: Mood normal.        Behavior: Behavior normal.        Thought Content: Thought content normal.        Judgment: Judgment normal.     Diagnostics: FVC 2.94, FEV1 1.99.  Predicted FVC 2.24, predicted FEV1 1.73.  Spirometry indicates mild airway obstruction.  Assessment and Plan: 1. Moderate persistent reactive airway disease without complication   2. Seasonal and perennial allergic rhinitis   3. Cough   4. Gastroesophageal reflux disease, unspecified whether esophagitis present     Meds ordered this encounter  Medications  . fluticasone (FLONASE) 50 MCG/ACT nasal spray    Sig: Place 2 sprays into both nostrils daily.     Dispense:  16 g    Refill:  5    Patient Instructions  Cough Continue montelukast 10 mg once a day to prevent cough or wheeze Continue Symbicort 160-2 puffs once a day to prevent cough or wheeze Continue Xopenex 2 puffs once every 4-6 hours as needed for cough or wheeze For asthma flare, increase Symbicort 160 to 2 puffs twice a day with a spacer for 2 weeks or when cough and wheeze free.  Then return to previous dosing  Allergic rhinitis Begin Flonase 2 sprays in each nostril once a day as needed for nasal symptoms.  In the right nostril, point the applicator out toward the right ear. In the left nostril, point the applicator out toward the left ear Continue azelastine 2 sprays in each nostril twice a day as needed for nasal symptoms Continue ipratropium 2 sprays in each nostril up to three times a day as needed for a runny nose Continuesaline nasal rinses as needed for nasal symptoms. Use this before any medicated nasal sprays for best result Continue allergen avoidance measures Continue RyVent 6 mg twice a day to control nasal symptoms  Reflux Begin dietary and lifestyle modifications as listed below We will consider adding medication for reflux if no improvement in cough after beginning the treatment plans as listed above  Call the clinic if this treatment plan is not working well for you  Follow up in 6 months or sooner if needed.   Return in about 6 months (around 01/26/2021), or if symptoms worsen or fail to improve.    Thank you for the opportunity to care for this patient.  Please do not hesitate to contact me with questions.  Gareth Morgan, FNP [facility]

## 2020-08-10 DIAGNOSIS — L720 Epidermal cyst: Secondary | ICD-10-CM | POA: Diagnosis not present

## 2020-08-11 ENCOUNTER — Encounter: Payer: Self-pay | Admitting: Neurology

## 2020-08-11 ENCOUNTER — Ambulatory Visit (INDEPENDENT_AMBULATORY_CARE_PROVIDER_SITE_OTHER): Payer: Medicare Other | Admitting: Neurology

## 2020-08-11 ENCOUNTER — Other Ambulatory Visit: Payer: Self-pay

## 2020-08-11 VITALS — BP 110/72 | HR 72 | Ht 65.5 in | Wt 174.0 lb

## 2020-08-11 DIAGNOSIS — G250 Essential tremor: Secondary | ICD-10-CM | POA: Diagnosis not present

## 2020-08-11 MED ORDER — PRIMIDONE 50 MG PO TABS: ORAL_TABLET | ORAL | 2 refills | Status: DC

## 2020-08-11 NOTE — Patient Instructions (Signed)

## 2020-08-11 NOTE — Progress Notes (Signed)
Assessment/Plan:    1.  Essential Tremor  -Continue primidone, 50 mg, 3 tablets in the morning and 1 tablets at night.  Discussed increasing but decided not bothersome enough to do it  2.  Leukopenia  -Follows with hematology.  Has had bone marrow biopsy.  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  3.  F/u 1 year  Subjective:   [name] was seen today in follow up for essential tremor.  My previous records were reviewed prior to todays visit.  When asked in person visit with her was about 2 years ago.  I did ask her to be seen in person, especially given the was seeing her through phone visit and had difficulty managing tremor this way.  We increased her primidone last visit. However, she states that she is doing 3 in the AM and 1 at night (I thought 3 in the AM, 2 at night).   She had no side effects.  Only notes tremor if writing a lot of cards.  No trouble with eating/pouring.  Pt denies falls.  Pt denies lightheadedness, near syncope.  No hallucinations.  Mood has been good.  She saw dermatology yesterday for an epidermoid cyst.  Notes are reviewed.  No treatment was recommended.  Current prescribed movement disorder medications: primidone 50 mg, 3/2 (increased last visit) - pt states that she is taking 3 in the AM and 1 at night.    ALLERGIES:   Allergies  Allergen Reactions  . Latex Shortness Of Breath and Other (See Comments)    CURRENT MEDICATIONS:  Outpatient Encounter Medications as of 08/11/2020  Medication Sig  . Ascorbic Acid (VITAMIN C) 250 MG CHEW Chew 250 mg by mouth.  Marland Kitchen azelastine (ASTELIN) 0.1 % nasal spray Place 2 sprays into both nostrils 2 (two) times daily.  . betamethasone dipropionate (DIPROLENE) 0.05 % ointment   . budesonide-formoterol (SYMBICORT) 160-4.5 MCG/ACT inhaler Inhale 2 puffs into the lungs 2 (two) times daily.  Marland Kitchen CALCIUM-MAGNESIUM-ZINC PO Take by mouth.  . Carbinoxamine Maleate (RYVENT) 6 MG TABS Take 1 tablet by mouth every  8 (eight) hours.  . cholecalciferol (VITAMIN D3) 25 MCG (1000 UT) tablet Take 1,000 Units by mouth daily.  . clindamycin (CLEOCIN-T) 1 % lotion   . clobetasol cream (TEMOVATE) 0.05 %   . diclofenac sodium (VOLTAREN) 1 % GEL   . Diclofenac Sodium 1.5 % SOLN Place onto the skin. Reported on 08/26/2015  . docusate sodium (COLACE) 100 MG capsule Take 100 mg by mouth daily as needed for mild constipation.  . fluticasone (FLONASE) 50 MCG/ACT nasal spray Place 2 sprays into both nostrils daily.  Marland Kitchen levalbuterol (XOPENEX HFA) 45 MCG/ACT inhaler Inhale 2 puffs into the lungs every 4 (four) hours as needed.  . montelukast (SINGULAIR) 10 MG tablet TAKE 1 TABLET(10 MG) BY MOUTH AT BEDTIME  . multivitamin-iron-minerals-folic acid (CENTRUM) chewable tablet Chew 1 tablet by mouth daily.  Marland Kitchen olmesartan-hydrochlorothiazide (BENICAR HCT) 20-12.5 MG per tablet Take 1 tablet by mouth daily.  . [DISCONTINUED] primidone (MYSOLINE) 50 MG tablet TAKE 2 TABLETS BY MOUTH TWICE DAILY  . NEXLETOL 180 MG TABS Take 1 tablet by mouth daily.  . primidone (MYSOLINE) 50 MG tablet 3 in the AM, 1 at bed  . [DISCONTINUED] Colesevelam HCl 3.75 g PACK  (Patient not taking: Reported on 08/11/2020)  . [DISCONTINUED] ipratropium (ATROVENT) 0.06 % nasal spray Place 2 sprays into both nostrils 3 (three) times daily.  . [DISCONTINUED] mometasone (ELOCON) 0.1 % cream  Apply 1 application topically daily. (Patient not taking: No sig reported)  . [DISCONTINUED] niacin (NIASPAN) 500 MG CR tablet Take 500 mg by mouth at bedtime. (Patient not taking: No sig reported)  . [DISCONTINUED] Oxymetazoline HCl (QC NASAL SPRAY NA) Place into the nose. (Patient not taking: No sig reported)  . [DISCONTINUED] Pitavastatin Calcium 4 MG TABS Take by mouth. (Patient not taking: No sig reported)  . [DISCONTINUED] traMADol (ULTRAM) 50 MG tablet Take 1 tablet (50 mg total) by mouth 2 (two) times daily. (Patient not taking: No sig reported)  . [DISCONTINUED] VENTOLIN  HFA 108 (90 Base) MCG/ACT inhaler Inhale 2 puffs into the lungs every 6 (six) hours as needed for wheezing or shortness of breath. (Patient not taking: Reported on 08/11/2020)   No facility-administered encounter medications on file as of 08/11/2020.     Objective:    PHYSICAL EXAMINATION:    VITALS:   Vitals:   08/11/20 1301  BP: 110/72  Pulse: 72  SpO2: 98%  Weight: 174 lb (78.9 kg)  Height: 5' 5.5" (1.664 m)    GEN:  The patient appears stated age and is in NAD. HEENT:  Normocephalic, atraumatic.    Neurological examination:  Orientation: The patient is alert and oriented x3. Cranial nerves: There is good facial symmetry. The speech is fluent and clear. Soft palate rises symmetrically and there is no tongue deviation. Hearing is intact to conversational tone. Sensation: Sensation is intact to light touch throughout Motor: Strength is at least antigravity x4.  Movement examination: Tone: There is normal tone in the UE/LE Abnormal movements: there is no rest tremor.  Mod intention tremor on the L - better with a weight.  Some trouble with archimedes spirals on the L Coordination:  There is n0 decremation with RAM's Gait and Station: The patient has an antalgic gait   Total time spent on today's visit wa 20 minutes, including both face-to-face time and nonface-to-face time.  Time included that spent on review of records (prior notes available to me/labs/imaging if pertinent), discussing treatment and goals, answering patient's questions and coordinating care.  Cc:  Lucianne Lei, MD

## 2020-08-14 DIAGNOSIS — D704 Cyclic neutropenia: Secondary | ICD-10-CM | POA: Diagnosis not present

## 2020-08-14 DIAGNOSIS — D72819 Decreased white blood cell count, unspecified: Secondary | ICD-10-CM | POA: Diagnosis not present

## 2020-09-09 DIAGNOSIS — Z1231 Encounter for screening mammogram for malignant neoplasm of breast: Secondary | ICD-10-CM | POA: Diagnosis not present

## 2020-10-05 DIAGNOSIS — E785 Hyperlipidemia, unspecified: Secondary | ICD-10-CM | POA: Diagnosis not present

## 2020-10-05 DIAGNOSIS — M130 Polyarthritis, unspecified: Secondary | ICD-10-CM | POA: Diagnosis not present

## 2020-10-05 DIAGNOSIS — I10 Essential (primary) hypertension: Secondary | ICD-10-CM | POA: Diagnosis not present

## 2020-10-05 DIAGNOSIS — E1169 Type 2 diabetes mellitus with other specified complication: Secondary | ICD-10-CM | POA: Diagnosis not present

## 2020-10-06 ENCOUNTER — Ambulatory Visit
Admission: RE | Admit: 2020-10-06 | Discharge: 2020-10-06 | Disposition: A | Discharge status: Home / Self Care | Payer: Medicare Other | Source: Ambulatory Visit | Attending: Family Medicine | Admitting: Family Medicine

## 2020-10-06 ENCOUNTER — Other Ambulatory Visit: Payer: Self-pay | Admitting: Family Medicine

## 2020-10-06 ENCOUNTER — Other Ambulatory Visit: Payer: Self-pay

## 2020-10-06 DIAGNOSIS — M25562 Pain in left knee: Secondary | ICD-10-CM | POA: Diagnosis not present

## 2020-10-06 DIAGNOSIS — R251 Tremor, unspecified: Secondary | ICD-10-CM | POA: Diagnosis not present

## 2020-10-06 DIAGNOSIS — Z0000 Encounter for general adult medical examination without abnormal findings: Secondary | ICD-10-CM | POA: Diagnosis not present

## 2020-10-06 DIAGNOSIS — M130 Polyarthritis, unspecified: Secondary | ICD-10-CM | POA: Diagnosis not present

## 2020-10-06 DIAGNOSIS — M79672 Pain in left foot: Secondary | ICD-10-CM

## 2020-10-06 DIAGNOSIS — R5383 Other fatigue: Secondary | ICD-10-CM | POA: Diagnosis not present

## 2020-10-06 DIAGNOSIS — I10 Essential (primary) hypertension: Secondary | ICD-10-CM | POA: Diagnosis not present

## 2020-10-06 DIAGNOSIS — M2012 Hallux valgus (acquired), left foot: Secondary | ICD-10-CM | POA: Diagnosis not present

## 2020-10-06 DIAGNOSIS — E119 Type 2 diabetes mellitus without complications: Secondary | ICD-10-CM | POA: Diagnosis not present

## 2020-10-09 ENCOUNTER — Other Ambulatory Visit: Payer: Self-pay | Admitting: Allergy & Immunology

## 2020-10-09 NOTE — Telephone Encounter (Signed)
Pt has an appointment with [facility] in September I sent in a 30 day supply with 4 refills to hold her over until her appointment

## 2020-11-23 DIAGNOSIS — M62830 Muscle spasm of back: Secondary | ICD-10-CM | POA: Diagnosis not present

## 2020-11-23 DIAGNOSIS — M62838 Other muscle spasm: Secondary | ICD-10-CM | POA: Diagnosis not present

## 2020-12-24 DIAGNOSIS — H6123 Impacted cerumen, bilateral: Secondary | ICD-10-CM | POA: Diagnosis not present

## 2021-01-04 DIAGNOSIS — L669 Cicatricial alopecia, unspecified: Secondary | ICD-10-CM | POA: Diagnosis not present

## 2021-01-04 DIAGNOSIS — L658 Other specified nonscarring hair loss: Secondary | ICD-10-CM | POA: Diagnosis not present

## 2021-01-05 ENCOUNTER — Ambulatory Visit (INDEPENDENT_AMBULATORY_CARE_PROVIDER_SITE_OTHER): Payer: Medicare Other | Admitting: Family

## 2021-01-05 ENCOUNTER — Ambulatory Visit (INDEPENDENT_AMBULATORY_CARE_PROVIDER_SITE_OTHER): Payer: Medicare Other

## 2021-01-05 ENCOUNTER — Other Ambulatory Visit: Payer: Self-pay

## 2021-01-05 DIAGNOSIS — M5441 Lumbago with sciatica, right side: Secondary | ICD-10-CM

## 2021-01-05 DIAGNOSIS — Z20822 Contact with and (suspected) exposure to covid-19: Secondary | ICD-10-CM | POA: Diagnosis not present

## 2021-01-05 MED ORDER — PREDNISONE 50 MG PO TABS: ORAL_TABLET | ORAL | 0 refills | Status: DC

## 2021-01-12 ENCOUNTER — Encounter: Payer: Self-pay | Admitting: Family

## 2021-01-12 NOTE — Progress Notes (Signed)
Office Visit Note   Patient: [name]           Date of Birth: [birth year]           MRN: [id number] Visit Date: 01/05/2021              Requested by: Lucianne Lei, MD [address] [place],  [place] [address] PCP: Lucianne Lei, MD  No chief complaint on file.     HPI: The patient is a 80 year old woman seen today for initial evaluation of some global right knee pain.  This is been ongoing for weeks to months.  It encompasses the entire anterior knee is sometimes deep sometimes feels behind the kneecap.  She has worse pain with standing with ambulation feels it will give way.  This is poorly localized.  She denies any numbness or tingling she does endorse having some low back pain radiating around her hip and down the anterior thigh.  No red flag symptoms  Assessment & Plan: Visit Diagnoses:  1. Acute right-sided low back pain with right-sided sciatica     Plan: Place on prednisone burst.  For her lumbar radiculopathy with right-sided radiculopathy.  Follow-up in the office in 4 weeks  Follow-up phone call on 01/12/21 patient voiced relief of her symptoms felt the prednisone worked quite well.  She would like to follow-up with [place] as needed  Follow-Up Instructions: No follow-ups on file.   Right Knee Exam   Tenderness  The patient is experiencing no tenderness.   Range of Motion  The patient has normal right knee ROM.  Tests  Varus: negative Valgus: negative   Right Hip Exam  Right hip exam is normal.    Back Exam   Tenderness  The patient is experiencing no tenderness.   Range of Motion  The patient has normal back ROM.  Muscle Strength  The patient has normal back strength.  Tests  Straight leg raise right: positive Straight leg raise left: negative     Patient is alert, oriented, no adenopathy, well-dressed, normal affect, normal respiratory effort.   Imaging: No results found. No images are attached to the encounter.  Labs: No  results found for: HGBA1C, ESRSEDRATE, CRP, LABURIC, REPTSTATUS, GRAMSTAIN, CULT, LABORGA   No results found for: ALBUMIN, PREALBUMIN, CBC  No results found for: MG No results found for: VD25OH  No results found for: PREALBUMIN CBC EXTENDED Latest Ref Rng & Units 11/09/2005  WBC 3.9 - 10.0 10e3/uL 2.5(L)  RBC 3.70 - 5.32 10e6/uL 4.61  HGB 11.6 - 15.9 g/dL 14.1  HCT 34.8 - 46.6 % 41.8  PLT 145 - 400 10e3/uL 174  NEUTROABS 1.5 - 6.5 10e3/uL 0.8(L)  LYMPHSABS 0.9 - 3.3 10e3/uL 1.4     There is no height or weight on file to calculate BMI.  Orders:  Orders Placed This Encounter  Procedures   XR Lumbar Spine 2-3 Views   Meds ordered this encounter  Medications   predniSONE (DELTASONE) 50 MG tablet    Sig: Take one tablet by mouth once daily for 5 days.    Dispense:  5 tablet    Refill:  0     Procedures: No procedures performed  Clinical Data: No additional findings.  ROS:  All other systems negative, except as noted in the HPI. Review of Systems  Constitutional:  Negative for chills and fever.  Musculoskeletal:  Positive for back pain and myalgias. Negative for joint swelling.  Neurological:  Negative  for weakness and numbness.   Objective: Vital Signs: There were no vitals taken for this visit.  Specialty Comments:  No specialty comments available.  PMFS History: Patient Active Problem List   Diagnosis Date Noted   Reactive airway disease 07/29/2020   Seasonal and perennial allergic rhinitis 07/29/2020   Gastroesophageal reflux disease 07/29/2020   Unilateral primary osteoarthritis, right knee 11/21/2017   Status post revision of total knee, left 11/21/2017   Chronic rhinitis 02/25/2016   Cough 02/25/2016   Hoarseness of voice 02/25/2016   Chronic leukopenia 12/15/2015   Past Medical History:  Diagnosis Date   Asthma    allergic induced   Cough    Hypertension    Rhinitis     Family History  Problem Relation Age of Onset   Other Mother         died when patient was 32 months old   Hypertension Father    Cerebral aneurysm Sister    Breast cancer Sister    Lung cancer Sister    Healthy Son    Allergic rhinitis Neg Hx    Angioedema Neg Hx    Asthma Neg Hx    Eczema Neg Hx    Immunodeficiency Neg Hx    Urticaria Neg Hx     Past Surgical History:  Procedure Laterality Date   ABDOMINAL HYSTERECTOMY     BACK SURGERY     CATARACT EXTRACTION     MASS EXCISION  01/05/2012   Procedure: MINOR EXCISION OF MASS;  Surgeon: Cammie Sickle., MD;  Location: [facility];  Service: Orthopedics;  Laterality: Right;  excision mucoid cyst right long finger, debride DIP joint   ROTATOR CUFF REPAIR Bilateral    TOTAL KNEE ARTHROPLASTY Left    twice   Social History   Occupational History   Occupation: retired    Comment: Warehouse manager  Tobacco Use   Smoking status: Never   Smokeless tobacco: Never  Vaping Use   Vaping Use: Never used  Substance and Sexual Activity   Alcohol use: No    Alcohol/week: 0.0 standard drinks   Drug use: No   Sexual activity: Not on file    Comment: retired from [employer]. 1 son.

## 2021-01-17 DIAGNOSIS — I10 Essential (primary) hypertension: Secondary | ICD-10-CM | POA: Diagnosis not present

## 2021-01-17 DIAGNOSIS — E1100 Type 2 diabetes mellitus with hyperosmolarity without nonketotic hyperglycemic-hyperosmolar coma (NKHHC): Secondary | ICD-10-CM | POA: Diagnosis not present

## 2021-01-17 DIAGNOSIS — E785 Hyperlipidemia, unspecified: Secondary | ICD-10-CM | POA: Diagnosis not present

## 2021-01-27 ENCOUNTER — Ambulatory Visit (INDEPENDENT_AMBULATORY_CARE_PROVIDER_SITE_OTHER): Payer: Medicare Other | Admitting: Allergy

## 2021-01-27 ENCOUNTER — Encounter: Payer: Self-pay | Admitting: Allergy

## 2021-01-27 ENCOUNTER — Other Ambulatory Visit: Payer: Self-pay

## 2021-01-27 VITALS — BP 116/68 | HR 81 | Temp 97.2°F | Ht 65.5 in | Wt 173.3 lb

## 2021-01-27 DIAGNOSIS — J302 Other seasonal allergic rhinitis: Secondary | ICD-10-CM | POA: Diagnosis not present

## 2021-01-27 DIAGNOSIS — J3089 Other allergic rhinitis: Secondary | ICD-10-CM

## 2021-01-27 DIAGNOSIS — J4540 Moderate persistent asthma, uncomplicated: Secondary | ICD-10-CM

## 2021-01-27 NOTE — Patient Instructions (Signed)
Cough - improved Continue montelukast 10 mg once a day to prevent cough or wheeze Continue Symbicort 160-2 puffs once a day to prevent cough or wheeze Continue Xopenex 2 puffs once every 4-6 hours as needed for cough or wheeze For asthma flare, increase Symbicort 160 to 2 puffs twice a day with a spacer for 2 weeks or when cough and wheeze free.  Then return to previous dosing  Allergic rhinitis- improved Continue allergen avoidance measures Continue Flonase 1-2 sprays in each nostril daily for 1-2 weeks at a time before stopping once nasal congestion improves for maximum benefit Continue azelastine 2 sprays in each nostril twice a day as needed for nasal symptoms Continue ipratropium 2 sprays in each nostril up to three times a day as needed for a runny nose In the right nostril, point the applicator out toward the right ear. In the left nostril, point the applicator out toward the left ear Continue saline nasal rinses as needed for nasal symptoms. Use this before any medicated nasal sprays for best result Continue RyVent 6 mg twice a day to control nasal symptoms  Follow up in 6 months or sooner if needed.

## 2021-01-27 NOTE — Progress Notes (Signed)
Follow-up Note  RE: VERALYNN SKUBIC MRN: [id number] DOB: 11-Jan-1941 Date of Office Visit: 01/27/2021   History of present illness: [name] is a 80 y.o. female presenting today for follow-up of cough, allergic rhinitis .  She was last seen in the office on 07/29/20 by our nurse practitioner Ambs.  She states she is doing much better.   She feels that the flonase has been helping her a lot and takes 1 spray each nostril at night.  She states it is helping with congestion control.  She also uses the astelin in the morning.  The Atrovent she will use midday if needed.  She does still have a little bit of drainage still even with those sprays. She is still taking Ryvent twice a day and feels like it is still helpful.  She states her breathing is doing well.  She is using albuterol "just a little" and reports minimal cough.  She takes symbicort 2 puffs once a day and singulair daily as well. No Ed/UC visits or any systemic steroids.  No nighttime awakenings.  She states she doesn't go out as much as she use to which also might be helping her allergy and cough control.   She denies any issues with reflux.   Review of systems: Review of Systems  Constitutional: Negative.   HENT:         See HPI  Eyes: Negative.   Respiratory:         See HPI  Cardiovascular: Negative.   Gastrointestinal: Negative.   Musculoskeletal: Negative.   Skin: Negative.   Neurological: Negative.    All other systems negative unless noted above in HPI  Past medical/social/surgical/family history have been reviewed and are unchanged unless specifically indicated below.  No changes  Medication List: Current Outpatient Medications  Medication Sig Dispense Refill   Ascorbic Acid (VITAMIN C) 250 MG CHEW Chew 250 mg by mouth.     azelastine (ASTELIN) 0.1 % nasal spray Place 2 sprays into both nostrils 2 (two) times daily. 90 mL 1   betamethasone dipropionate (DIPROLENE) 0.05 % ointment       budesonide-formoterol (SYMBICORT) 160-4.5 MCG/ACT inhaler Inhale 2 puffs into the lungs 2 (two) times daily. 10.2 g 5   CALCIUM-MAGNESIUM-ZINC PO Take by mouth.     Carbinoxamine Maleate (RYVENT) 6 MG TABS Take 1 tablet by mouth every 8 (eight) hours. 120 tablet 5   cholecalciferol (VITAMIN D3) 25 MCG (1000 UT) tablet Take 1,000 Units by mouth daily.     clindamycin (CLEOCIN-T) 1 % lotion      clobetasol cream (TEMOVATE) 0.05 %      diclofenac sodium (VOLTAREN) 1 % GEL      Diclofenac Sodium 1.5 % SOLN Place onto the skin. Reported on 08/26/2015     docusate sodium (COLACE) 100 MG capsule Take 100 mg by mouth daily as needed for mild constipation.     fluticasone (FLONASE) 50 MCG/ACT nasal spray Place 2 sprays into both nostrils daily. 16 g 5   levalbuterol (XOPENEX HFA) 45 MCG/ACT inhaler Inhale 2 puffs into the lungs every 4 (four) hours as needed. 15 g 1   montelukast (SINGULAIR) 10 MG tablet TAKE 1 TABLET(10 MG) BY MOUTH AT BEDTIME 30 tablet 4   multivitamin-iron-minerals-folic acid (CENTRUM) chewable tablet Chew 1 tablet by mouth daily.     NEXLETOL 180 MG TABS Take 1 tablet by mouth daily.     olmesartan-hydrochlorothiazide (BENICAR HCT) 20-12.5 MG per tablet Take 1  tablet by mouth daily.     primidone (MYSOLINE) 50 MG tablet 3 in the AM, 1 at bed 360 tablet 2   predniSONE (DELTASONE) 50 MG tablet Take one tablet by mouth once daily for 5 days. (Patient not taking: Reported on 01/27/2021) 5 tablet 0   No current facility-administered medications for this visit.     Known medication allergies: Allergies  Allergen Reactions   Latex Shortness Of Breath and Other (See Comments)     Physical examination: Blood pressure 116/68, pulse 81, temperature (!) 97.2 F (36.2 C), temperature source Temporal, height 5' 5.5" (1.664 m), weight 173 lb 5 oz (78.6 kg), SpO2 98 %.  General: Alert, interactive, in no acute distress. HEENT: PERRLA, TMs pearly gray, turbinates non-edematous without  discharge, post-pharynx non erythematous. Neck: Supple without lymphadenopathy. Lungs: Clear to auscultation without wheezing, rhonchi or rales. {no increased work of breathing. CV: Normal S1, S2 without murmurs. Abdomen: Nondistended, nontender. Skin: Warm and dry, without lesions or rashes. Extremities:  No clubbing, cyanosis or edema. Neuro:   Grossly intact.  Diagnositics/Labs: None today  Assessment and plan:   Cough/moderate persistent reactive airway - improved Continue montelukast 10 mg once a day to prevent cough or wheeze Continue Symbicort 160-2 puffs once a day to prevent cough or wheeze Continue Xopenex 2 puffs once every 4-6 hours as needed for cough or wheeze For asthma flare, increase Symbicort 160 to 2 puffs twice a day with a spacer for 2 weeks or when cough and wheeze free.  Then return to previous dosing  Allergic rhinitis- improved Continue allergen avoidance measures Continue Flonase 1-2 sprays in each nostril daily for 1-2 weeks at a time before stopping once nasal congestion improves for maximum benefit Continue azelastine 2 sprays in each nostril twice a day as needed for nasal symptoms Continue ipratropium 2 sprays in each nostril up to three times a day as needed for a runny nose In the right nostril, point the applicator out toward the right ear. In the left nostril, point the applicator out toward the left ear Continue saline nasal rinses as needed for nasal symptoms. Use this before any medicated nasal sprays for best result Continue RyVent 6 mg twice a day to control nasal symptoms  Follow up in 6 months or sooner if needed.  I appreciate the opportunity to take part in Tommie's care. Please do not hesitate to contact me with questions.  Sincerely,   Prudy Feeler, MD Allergy/Immunology [facility]

## 2021-02-04 DIAGNOSIS — E785 Hyperlipidemia, unspecified: Secondary | ICD-10-CM | POA: Diagnosis not present

## 2021-02-04 DIAGNOSIS — I10 Essential (primary) hypertension: Secondary | ICD-10-CM | POA: Diagnosis not present

## 2021-02-04 DIAGNOSIS — E1169 Type 2 diabetes mellitus with other specified complication: Secondary | ICD-10-CM | POA: Diagnosis not present

## 2021-02-05 DIAGNOSIS — I10 Essential (primary) hypertension: Secondary | ICD-10-CM | POA: Diagnosis not present

## 2021-02-05 DIAGNOSIS — E1169 Type 2 diabetes mellitus with other specified complication: Secondary | ICD-10-CM | POA: Diagnosis not present

## 2021-02-05 DIAGNOSIS — E785 Hyperlipidemia, unspecified: Secondary | ICD-10-CM | POA: Diagnosis not present

## 2021-02-24 ENCOUNTER — Telehealth: Payer: Self-pay | Admitting: Allergy

## 2021-03-03 MED ORDER — AZELASTINE HCL 0.1 % NA SOLN: 2.0000 | Freq: Two times a day (BID) | NASAL | 0 refills | Status: DC

## 2021-03-03 MED ORDER — FLUTICASONE PROPIONATE 50 MCG/ACT NA SUSP: 2.0000 | Freq: Every day | NASAL | 0 refills | Status: DC

## 2021-03-03 NOTE — Telephone Encounter (Signed)
Patient called and needs to have Flonase and Astelin called into [facility] on [address] [phone]

## 2021-03-03 NOTE — Telephone Encounter (Signed)
Sent in refill to [facility] on [address] for Flonase and astelin

## 2021-03-09 DIAGNOSIS — Z23 Encounter for immunization: Secondary | ICD-10-CM | POA: Diagnosis not present

## 2021-03-17 ENCOUNTER — Other Ambulatory Visit: Payer: Self-pay | Admitting: Allergy & Immunology

## 2021-03-19 DIAGNOSIS — E785 Hyperlipidemia, unspecified: Secondary | ICD-10-CM | POA: Diagnosis not present

## 2021-03-19 DIAGNOSIS — G252 Other specified forms of tremor: Secondary | ICD-10-CM | POA: Diagnosis not present

## 2021-03-19 DIAGNOSIS — E1100 Type 2 diabetes mellitus with hyperosmolarity without nonketotic hyperglycemic-hyperosmolar coma (NKHHC): Secondary | ICD-10-CM | POA: Diagnosis not present

## 2021-03-19 DIAGNOSIS — I10 Essential (primary) hypertension: Secondary | ICD-10-CM | POA: Diagnosis not present

## 2021-04-01 ENCOUNTER — Ambulatory Visit: Payer: Self-pay

## 2021-04-01 ENCOUNTER — Encounter: Payer: Self-pay | Admitting: Orthopedic Surgery

## 2021-04-01 ENCOUNTER — Ambulatory Visit (INDEPENDENT_AMBULATORY_CARE_PROVIDER_SITE_OTHER): Payer: Medicare Other | Admitting: Orthopedic Surgery

## 2021-04-01 DIAGNOSIS — M25561 Pain in right knee: Secondary | ICD-10-CM | POA: Diagnosis not present

## 2021-04-01 DIAGNOSIS — M1711 Unilateral primary osteoarthritis, right knee: Secondary | ICD-10-CM

## 2021-04-01 DIAGNOSIS — G8929 Other chronic pain: Secondary | ICD-10-CM

## 2021-04-01 DIAGNOSIS — M25562 Pain in left knee: Secondary | ICD-10-CM | POA: Diagnosis not present

## 2021-04-01 DIAGNOSIS — Z96652 Presence of left artificial knee joint: Secondary | ICD-10-CM

## 2021-04-01 DIAGNOSIS — L97521 Non-pressure chronic ulcer of other part of left foot limited to breakdown of skin: Secondary | ICD-10-CM

## 2021-04-01 MED ORDER — LIDOCAINE HCL (PF) 1 % IJ SOLN
5.0000 mL | INTRAMUSCULAR | Status: AC | PRN
  Administered 2021-04-01: 5 mL

## 2021-04-01 MED ORDER — METHYLPREDNISOLONE ACETATE 40 MG/ML IJ SUSP
40.0000 mg | INTRAMUSCULAR | Status: AC | PRN
Start: 2021-04-01 — End: 2021-04-01
  Administered 2021-04-01: 40 mg via INTRA_ARTICULAR

## 2021-04-01 NOTE — Progress Notes (Addendum)
Office Visit Note   Patient: [name]           Date of Birth: May 11, 1941           MRN: [id number] Visit Date: 04/01/2021              Requested by: Lucianne Lei, MD [address] [place],  [place] [address] PCP: Lucianne Lei, MD  No chief complaint on file.     HPI: Patient is a 80 year old woman who presents with pain in both knees.  She states she is status post a left total knee arthroplasty revision secondary to a fall most likely periprosthetic fracture.  Patient states she has been having giving way and instability with her right knee.  Patient also complains of an ulcer beneath the second metatarsal head of the left foot.  Assessment & Plan: Visit Diagnoses:  1. Chronic pain of both knees   2. S/P revision of total knee, left   3. Primary osteoarthritis of right knee   4. Non-pressure chronic ulcer of other part of left foot limited to breakdown of skin ([place])     Plan: Ulcer was debrided on the left foot we will reevaluate in 4 weeks.  With reevaluation of left foot ulcer and right arthritic knee.  Follow-Up Instructions: Return in about 4 weeks (around 04/29/2021).   Ortho Exam  Patient is alert, oriented, no adenopathy, well-dressed, normal affect, normal respiratory effort. Examination patient has good pulses bilaterally right knee only has range of motion from 10 to 90 degrees with crepitation and mechanical symptoms with range of motion she is tender to palpation in the patellofemoral joint as well as medial lateral joint lines.  Collaterals and cruciates are stable she does have an effusion.  Left knee has no deficits with range of motion no effusion no redness no cellulitis.  Examination left foot patient has a Wagner grade 1 ulcer beneath the second metatarsal head.  After informed consent a 10 blade knife was used to debride the skin and soft tissue back to healthy viable tissue the ulcer was 2 cm in diameter 2 mm deep after debridement.  The ulcer was  1 cm diameter prior to debridement.  Imaging: XR Knee 1-2 Views Left  Result Date: 04/01/2021 2 view radiographs of the left knee shows a stable revision left total knee arthroplasty.  XR Knee 1-2 Views Right  Result Date: 04/01/2021 2 view radiographs of the right knee shows tricompartmental arthritic changes with joint space narrowing periarticular bony spurs in all 3 compartments.  No images are attached to the encounter.  Labs: No results found for: HGBA1C, ESRSEDRATE, CRP, LABURIC, REPTSTATUS, GRAMSTAIN, CULT, LABORGA   No results found for: ALBUMIN, PREALBUMIN, CBC  No results found for: MG No results found for: VD25OH  No results found for: PREALBUMIN CBC EXTENDED Latest Ref Rng & Units 11/09/2005  WBC 3.9 - 10.0 10e3/uL 2.5(L)  RBC 3.70 - 5.32 10e6/uL 4.61  HGB 11.6 - 15.9 g/dL 14.1  HCT 34.8 - 46.6 % 41.8  PLT 145 - 400 10e3/uL 174  NEUTROABS 1.5 - 6.5 10e3/uL 0.8(L)  LYMPHSABS 0.9 - 3.3 10e3/uL 1.4     There is no height or weight on file to calculate BMI.  Orders:  Orders Placed This Encounter  Procedures   Large Joint Inj   XR Knee 1-2 Views Right   XR Knee 1-2 Views Left   No orders of the defined types were placed in this encounter.  Procedures: Large Joint Inj: R knee on 04/01/2021 11:36 AM Indications: pain and diagnostic evaluation Details: 22 G 1.5 in needle, anteromedial approach  Arthrogram: No  Medications: 5 mL lidocaine (PF) 1 %; 40 mg methylPREDNISolone acetate 40 MG/ML Outcome: tolerated well, no immediate complications Procedure, treatment alternatives, risks and benefits explained, specific risks discussed. Consent was given by the patient. Immediately prior to procedure a time out was called to verify the correct patient, procedure, equipment, support staff and site/side marked as required. Patient was prepped and draped in the usual sterile fashion.     Clinical Data: No additional findings.  ROS:  All other systems  negative, except as noted in the HPI. Review of Systems  Objective: Vital Signs: There were no vitals taken for this visit.  Specialty Comments:  No specialty comments available.  PMFS History: Patient Active Problem List   Diagnosis Date Noted   Reactive airway disease 07/29/2020   Seasonal and perennial allergic rhinitis 07/29/2020   Gastroesophageal reflux disease 07/29/2020   Unilateral primary osteoarthritis, right knee 11/21/2017   Status post revision of total knee, left 11/21/2017   Chronic rhinitis 02/25/2016   Cough 02/25/2016   Hoarseness of voice 02/25/2016   Chronic leukopenia 12/15/2015   Past Medical History:  Diagnosis Date   Asthma    allergic induced   Cough    Hypertension    Rhinitis     Family History  Problem Relation Age of Onset   Other Mother        died when patient was 5 months old   Hypertension Father    Cerebral aneurysm Sister    Breast cancer Sister    Lung cancer Sister    Healthy Son    Allergic rhinitis Neg Hx    Angioedema Neg Hx    Asthma Neg Hx    Eczema Neg Hx    Immunodeficiency Neg Hx    Urticaria Neg Hx     Past Surgical History:  Procedure Laterality Date   ABDOMINAL HYSTERECTOMY     BACK SURGERY     CATARACT EXTRACTION     MASS EXCISION  01/05/2012   Procedure: MINOR EXCISION OF MASS;  Surgeon: Cammie Sickle., MD;  Location: [facility];  Service: Orthopedics;  Laterality: Right;  excision mucoid cyst right long finger, debride DIP joint   ROTATOR CUFF REPAIR Bilateral    TOTAL KNEE ARTHROPLASTY Left    twice   Social History   Occupational History   Occupation: retired    Comment: Warehouse manager  Tobacco Use   Smoking status: Never   Smokeless tobacco: Never  Vaping Use   Vaping Use: Never used  Substance and Sexual Activity   Alcohol use: No    Alcohol/week: 0.0 standard drinks   Drug use: No   Sexual activity: Not on file    Comment: retired from [employer]. 1 son.

## 2021-04-06 DIAGNOSIS — Z23 Encounter for immunization: Secondary | ICD-10-CM | POA: Diagnosis not present

## 2021-04-29 ENCOUNTER — Ambulatory Visit (INDEPENDENT_AMBULATORY_CARE_PROVIDER_SITE_OTHER): Payer: Medicare Other | Admitting: Orthopedic Surgery

## 2021-04-29 ENCOUNTER — Other Ambulatory Visit: Payer: Self-pay

## 2021-04-29 DIAGNOSIS — G8929 Other chronic pain: Secondary | ICD-10-CM

## 2021-04-29 DIAGNOSIS — Z96652 Presence of left artificial knee joint: Secondary | ICD-10-CM

## 2021-04-29 DIAGNOSIS — L97521 Non-pressure chronic ulcer of other part of left foot limited to breakdown of skin: Secondary | ICD-10-CM | POA: Diagnosis not present

## 2021-04-29 DIAGNOSIS — M1711 Unilateral primary osteoarthritis, right knee: Secondary | ICD-10-CM

## 2021-04-29 DIAGNOSIS — M25561 Pain in right knee: Secondary | ICD-10-CM

## 2021-04-29 DIAGNOSIS — M25562 Pain in left knee: Secondary | ICD-10-CM

## 2021-05-11 ENCOUNTER — Other Ambulatory Visit: Payer: Self-pay | Admitting: Neurology

## 2021-05-11 DIAGNOSIS — G250 Essential tremor: Secondary | ICD-10-CM

## 2021-05-19 DIAGNOSIS — E1100 Type 2 diabetes mellitus with hyperosmolarity without nonketotic hyperglycemic-hyperosmolar coma (NKHHC): Secondary | ICD-10-CM | POA: Diagnosis not present

## 2021-05-19 DIAGNOSIS — E785 Hyperlipidemia, unspecified: Secondary | ICD-10-CM | POA: Diagnosis not present

## 2021-05-19 DIAGNOSIS — I10 Essential (primary) hypertension: Secondary | ICD-10-CM | POA: Diagnosis not present

## 2021-05-20 DIAGNOSIS — Q809 Congenital ichthyosis, unspecified: Secondary | ICD-10-CM | POA: Diagnosis not present

## 2021-05-20 DIAGNOSIS — L810 Postinflammatory hyperpigmentation: Secondary | ICD-10-CM | POA: Diagnosis not present

## 2021-05-31 ENCOUNTER — Other Ambulatory Visit: Payer: Self-pay | Admitting: Allergy

## 2021-06-09 ENCOUNTER — Encounter: Payer: Self-pay | Admitting: Orthopedic Surgery

## 2021-06-09 NOTE — Progress Notes (Signed)
Office Visit Note   Patient: [name]           Date of Birth: 1940/07/31           MRN: [id number] Visit Date: 04/29/2021              Requested by: Lucianne Lei, [address] [place],  [place] [address] PCP: Lucianne Lei, MD  Chief Complaint  Patient presents with   Right Knee - Follow-up    Arthritis   Left Foot - Wound Check    Ulcer on 2nd metatarsal head      HPI: Patient is a 80 year old woman who presents in follow-up for osteoarthritis of her right knee she is status post revision of the left total knee arthroplasty.  She has had a persistent ulcer beneath the second metatarsal head left foot.  Patient states the right knee is better after the injection.  Assessment & Plan: Visit Diagnoses:  1. Chronic pain of both knees   2. Primary osteoarthritis of right knee   3. S/P revision of total knee, left   4. Non-pressure chronic ulcer of other part of left foot limited to breakdown of skin ([place])     Plan: Recommended continued strengthening for both knees.  Continue with pressure offloading for the left foot.  Work on Achilles stretching.  Follow-Up Instructions: Return if symptoms worsen or fail to improve.   Ortho Exam  Patient is alert, oriented, no adenopathy, well-dressed, normal affect, normal respiratory effort. Examination both knees have good range of motion no signs of infection.  Examination the left foot she does have Achilles tightness with dorsiflexion only to neutral.  She was given instructions and demonstrated stretching.  There is not callus beneath the second metatarsal head this was pared with a 10 blade knife without complications.  No signs of deep abscess no exposed bone or tendon.  Imaging: No results found. No images are attached to the encounter.  Labs: No results found for: HGBA1C, ESRSEDRATE, CRP, LABURIC, REPTSTATUS, GRAMSTAIN, CULT, LABORGA   No results found for: ALBUMIN, PREALBUMIN, CBC  No results found for:  MG No results found for: VD25OH  No results found for: PREALBUMIN CBC EXTENDED Latest Ref Rng & Units 11/09/2005  WBC 3.9 - 10.0 10e3/uL 2.5(L)  RBC 3.70 - 5.32 10e6/uL 4.61  HGB 11.6 - 15.9 g/dL 14.1  HCT 34.8 - 46.6 % 41.8  PLT 145 - 400 10e3/uL 174  NEUTROABS 1.5 - 6.5 10e3/uL 0.8(L)  LYMPHSABS 0.9 - 3.3 10e3/uL 1.4     There is no height or weight on file to calculate BMI.  Orders:  No orders of the defined types were placed in this encounter.  No orders of the defined types were placed in this encounter.    Procedures: No procedures performed  Clinical Data: No additional findings.  ROS:  All other systems negative, except as noted in the HPI. Review of Systems  Objective: Vital Signs: There were no vitals taken for this visit.  Specialty Comments:  No specialty comments available.  PMFS History: Patient Active Problem List   Diagnosis Date Noted   Reactive airway disease 07/29/2020   Seasonal and perennial allergic rhinitis 07/29/2020   Gastroesophageal reflux disease 07/29/2020   Unilateral primary osteoarthritis, right knee 11/21/2017   Status post revision of total knee, left 11/21/2017   Chronic rhinitis 02/25/2016   Cough 02/25/2016   Hoarseness of voice 02/25/2016   Chronic leukopenia 12/15/2015   Past  Medical History:  Diagnosis Date   Asthma    allergic induced   Cough    Hypertension    Rhinitis     Family History  Problem Relation Age of Onset   Other Mother        died when patient was 56 months old   Hypertension Father    Cerebral aneurysm Sister    Breast cancer Sister    Lung cancer Sister    Healthy Son    Allergic rhinitis Neg Hx    Angioedema Neg Hx    Asthma Neg Hx    Eczema Neg Hx    Immunodeficiency Neg Hx    Urticaria Neg Hx     Past Surgical History:  Procedure Laterality Date   ABDOMINAL HYSTERECTOMY     BACK SURGERY     CATARACT EXTRACTION     MASS EXCISION  01/05/2012   Procedure: MINOR EXCISION OF MASS;   Surgeon: Cammie Sickle., MD;  Location: [facility];  Service: Orthopedics;  Laterality: Right;  excision mucoid cyst right long finger, debride DIP joint   ROTATOR CUFF REPAIR Bilateral    TOTAL KNEE ARTHROPLASTY Left    twice   Social History   Occupational History   Occupation: retired    Comment: Warehouse manager  Tobacco Use   Smoking status: Never   Smokeless tobacco: Never  Vaping Use   Vaping Use: Never used  Substance and Sexual Activity   Alcohol use: No    Alcohol/week: 0.0 standard drinks   Drug use: No   Sexual activity: Not on file    Comment: retired from [employer]. 1 son.

## 2021-07-20 DIAGNOSIS — Z1211 Encounter for screening for malignant neoplasm of colon: Secondary | ICD-10-CM | POA: Diagnosis not present

## 2021-07-20 DIAGNOSIS — Z1212 Encounter for screening for malignant neoplasm of rectum: Secondary | ICD-10-CM | POA: Diagnosis not present

## 2021-07-27 DIAGNOSIS — H6123 Impacted cerumen, bilateral: Secondary | ICD-10-CM | POA: Diagnosis not present

## 2021-07-30 ENCOUNTER — Ambulatory Visit: Payer: Medicare Other | Admitting: Allergy

## 2021-08-06 ENCOUNTER — Other Ambulatory Visit: Payer: Self-pay | Admitting: Neurology

## 2021-08-06 DIAGNOSIS — G250 Essential tremor: Secondary | ICD-10-CM

## 2021-08-11 ENCOUNTER — Other Ambulatory Visit: Payer: Self-pay

## 2021-08-11 ENCOUNTER — Other Ambulatory Visit: Payer: Self-pay | Admitting: Allergy & Immunology

## 2021-08-11 MED ORDER — MONTELUKAST SODIUM 10 MG PO TABS: ORAL_TABLET | ORAL | 0 refills | Status: DC

## 2021-08-11 NOTE — Progress Notes (Signed)
Assessment/Plan:    1.  Essential Tremor  -Increase primidone, 50 mg, 4 tablets in the morning and 1 tablets at night.    2.  Leukopenia  -Follows with hematology.  Has had bone marrow biopsy.  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  3.  F/u 1 year  Subjective:   [name] was seen today in follow up for essential tremor.  Records reviewed prior to today's visit, including those from her hematologist.  She reports that tremor has been fairly stable, but still is not ideally controlled.  She is interested in increasing the medication..  Current prescribed movement disorder medications: primidone 50 mg, 3 tablets in the morning, 1 tablet at night    ALLERGIES:   Allergies  Allergen Reactions   Latex Shortness Of Breath and Other (See Comments)    CURRENT MEDICATIONS:  Outpatient Encounter Medications as of 08/12/2021  Medication Sig   Ascorbic Acid (VITAMIN C) 250 MG CHEW Chew 250 mg by mouth.   azelastine (ASTELIN) 0.1 % nasal spray Place 2 sprays into both nostrils 2 (two) times daily.   betamethasone dipropionate (DIPROLENE) 0.05 % ointment    budesonide-formoterol (SYMBICORT) 160-4.5 MCG/ACT inhaler Inhale 2 puffs into the lungs 2 (two) times daily.   CALCIUM-MAGNESIUM-ZINC PO Take by mouth.   cholecalciferol (VITAMIN D3) 25 MCG (1000 UT) tablet Take 1,000 Units by mouth daily.   diclofenac sodium (VOLTAREN) 1 % GEL    Diclofenac Sodium 1.5 % SOLN Place onto the skin. Reported on 08/26/2015   docusate sodium (COLACE) 100 MG capsule Take 100 mg by mouth daily as needed for mild constipation.   fluticasone (FLONASE) 50 MCG/ACT nasal spray SHAKE LIQUID AND USE 2 SPRAYS IN EACH NOSTRIL DAILY   levalbuterol (XOPENEX HFA) 45 MCG/ACT inhaler Inhale 2 puffs into the lungs every 4 (four) hours as needed.   montelukast (SINGULAIR) 10 MG tablet TAKE 1 TABLET(10 MG) BY MOUTH AT BEDTIME   multivitamin-iron-minerals-folic acid (CENTRUM) chewable tablet Chew 1 tablet  by mouth daily.   NEXLETOL 180 MG TABS Take 1 tablet by mouth daily.   olmesartan-hydrochlorothiazide (BENICAR HCT) 20-12.5 MG per tablet Take 1 tablet by mouth daily.   [DISCONTINUED] primidone (MYSOLINE) 50 MG tablet TAKE 3 TABLETS BY MOUTH EVERY MORNING THEN TAKE 1 TABLET BY MOUTH EVERY NIGHT AT BEDTIME   primidone (MYSOLINE) 50 MG tablet 4 in the AM, 1 at night   [DISCONTINUED] clindamycin (CLEOCIN-T) 1 % lotion  (Patient not taking: Reported on 08/12/2021)   [DISCONTINUED] clobetasol cream (TEMOVATE) 0.05 %  (Patient not taking: Reported on 08/12/2021)   [DISCONTINUED] montelukast (SINGULAIR) 10 MG tablet TAKE 1 TABLET(10 MG) BY MOUTH AT BEDTIME   [DISCONTINUED] predniSONE (DELTASONE) 50 MG tablet Take one tablet by mouth once daily for 5 days. (Patient not taking: Reported on 01/27/2021)   [DISCONTINUED] RYVENT 6 MG TABS TAKE 1 TABLET BY MOUTH EVERY 8 HOURS (Patient not taking: Reported on 08/12/2021)   No facility-administered encounter medications on file as of 08/12/2021.     Objective:    PHYSICAL EXAMINATION:    VITALS:   Vitals:   08/12/21 1128  BP: 136/79  Pulse: 97  SpO2: 98%  Weight: 172 lb 6.4 oz (78.2 kg)  Height: '5\' 5"'  (1.651 m)     GEN:  The patient appears stated age and is in NAD. HEENT:  Normocephalic, atraumatic.    Neurological examination:  Orientation: The patient is alert and oriented x3. Cranial nerves: There is good  facial symmetry. The speech is fluent and clear. Soft palate rises symmetrically and there is no tongue deviation. Hearing is intact to conversational tone. Sensation: Sensation is intact to light touch throughout Motor: Strength is at least antigravity x4.  Movement examination: Tone: There is normal tone in the UE/LE Abnormal movements: there is no rest tremor.  No postural tremor.  She had mild intention tremor on the left, which was actually better.  Her Archimedes spirals were much better on the left than in the past. Coordination:   There is no decremation with RAM's Gait and Station: The patient has an antalgic gait.  She drags the left leg.   Total time spent on today's visit was 20 minutes, including both face-to-face time and nonface-to-face time.  Time included that spent on review of records (prior notes available to me/labs/imaging if pertinent), discussing treatment and goals, answering patient's questions and coordinating care.  Cc:  Lucianne Lei, MD

## 2021-08-12 ENCOUNTER — Ambulatory Visit (INDEPENDENT_AMBULATORY_CARE_PROVIDER_SITE_OTHER): Payer: Medicare Other | Admitting: Neurology

## 2021-08-12 ENCOUNTER — Encounter: Payer: Self-pay | Admitting: Neurology

## 2021-08-12 ENCOUNTER — Other Ambulatory Visit: Payer: Self-pay

## 2021-08-12 DIAGNOSIS — G250 Essential tremor: Secondary | ICD-10-CM

## 2021-08-12 MED ORDER — PRIMIDONE 50 MG PO TABS: ORAL_TABLET | ORAL | 2 refills | Status: DC

## 2021-08-12 NOTE — Patient Instructions (Signed)
Increase primidone, 50 mg, 4 tablets in the morning and 1 tablets at night.

## 2021-08-16 ENCOUNTER — Telehealth: Payer: Self-pay | Admitting: Neurology

## 2021-08-16 NOTE — Telephone Encounter (Signed)
Pt called in and left a message. She says she started a new dose of primidone. She states her right pinky finger is turning blue and hurts when she bends it.

## 2021-08-16 NOTE — Telephone Encounter (Signed)
Called patient back and she did not answer left message that her injury doesn't sound at all like anything to do with her medication Primidone. That is not a side effect. It sounds as if she hurt her hand in some way and recommended she call her PCP for a follow up and xray of her pinky finger

## 2021-08-18 DIAGNOSIS — E119 Type 2 diabetes mellitus without complications: Secondary | ICD-10-CM | POA: Diagnosis not present

## 2021-08-18 DIAGNOSIS — I10 Essential (primary) hypertension: Secondary | ICD-10-CM | POA: Diagnosis not present

## 2021-08-18 DIAGNOSIS — E785 Hyperlipidemia, unspecified: Secondary | ICD-10-CM | POA: Diagnosis not present

## 2021-08-23 DIAGNOSIS — H35033 Hypertensive retinopathy, bilateral: Secondary | ICD-10-CM | POA: Diagnosis not present

## 2021-08-25 ENCOUNTER — Ambulatory Visit (INDEPENDENT_AMBULATORY_CARE_PROVIDER_SITE_OTHER): Payer: Medicare Other | Admitting: Allergy

## 2021-08-25 ENCOUNTER — Other Ambulatory Visit: Payer: Self-pay

## 2021-08-25 ENCOUNTER — Encounter: Payer: Self-pay | Admitting: Allergy

## 2021-08-25 VITALS — BP 124/66 | HR 92 | Temp 96.2°F | Resp 18 | Ht 65.5 in | Wt 171.4 lb

## 2021-08-25 DIAGNOSIS — J302 Other seasonal allergic rhinitis: Secondary | ICD-10-CM

## 2021-08-25 DIAGNOSIS — J4540 Moderate persistent asthma, uncomplicated: Secondary | ICD-10-CM | POA: Diagnosis not present

## 2021-08-25 DIAGNOSIS — J3089 Other allergic rhinitis: Secondary | ICD-10-CM

## 2021-08-25 MED ORDER — CARBINOXAMINE MALEATE 6 MG PO TABS: ORAL_TABLET | ORAL | 2 refills | Status: DC

## 2021-08-25 NOTE — Patient Instructions (Addendum)
Cough ?Continue montelukast 10 mg once a day to prevent cough or wheeze ?Continue Symbicort 160-2 puffs once a day to prevent cough or wheeze ?Continue Xopenex 2 puffs once every 4-6 hours as needed for cough or wheeze ?For asthma flare, increase Symbicort 160 to 2 puffs twice a day with a spacer for 2 weeks or when cough and wheeze free.  Then return to previous dosing ? ?Allergic rhinitis ?Continue allergen avoidance measures ?Continue Flonase 1-2 sprays in each nostril daily for 1-2 weeks at a time before stopping once nasal congestion improves for maximum benefit ?Use Azelastine 2 sprays in each nostril twice a day now for nasal drainage control ?Continue ipratropium 2 sprays in each nostril up to three times a day as needed for a runny nose ?In the right nostril, point the applicator out toward the right ear. In the left nostril, point the applicator out toward the left ear ?Continue saline nasal rinses as needed for nasal symptoms. Use this before any medicated nasal sprays for best result ?Resume RyVent (carbinoxamine) 6-8 mg twice a day to control nasal symptoms.  We are sending Ryvent to [facility] (let us know if you get a call from them regarding prescription).    ? ?Follow up in 6 months or sooner if needed. ?

## 2021-08-25 NOTE — Progress Notes (Signed)
? ? ?Follow-up Note ? ?RE: [name] MRN: [id number] DOB: [birth year] ?Date of Office Visit: 08/25/2021 ? ? ?History of present illness: ?[name] is a 81 y.o. female presenting today for follow-up of allergic rhinitis and cough.  She was last seen in the office on 01/27/21 by myself.  Since her last visit she has done some traveling up [place].  She states her mother was going back and forth like it has been here. ?She states she has been having runny nose lately a lot.  The nasal sprays help but the sprays only worked very temporarily.  She states she uses the azelastine in the morning and the ipratropium spray in the evening.  She also states she was told by the pharmacy that the Ryvent was discontinued because she has not had this to take anymore.  This when she had was helpful with her nasal sprays for better allergy symptoms control.  ?In regards to her breathing she states she has needed to use her Xopenex couple times a week for cough symptoms.  It does help with the cough.  She is using Symbicort 2 puffs once a day as well as montelukast daily.  She has not required any ED or urgent care visits or assist steroid needs. ? ?Review of systems: ?Review of Systems  ?Constitutional: Negative.   ?HENT:  Positive for postnasal drip.   ?Eyes: Negative.   ?Respiratory:  Positive for cough.   ?Cardiovascular: Negative.   ?Gastrointestinal: Negative.   ?Musculoskeletal: Negative.   ?Skin: Negative.   ?Allergic/Immunologic: Negative.   ?Neurological: Negative.    ? ?All other systems negative unless noted above in HPI ? ?Past medical/social/surgical/family history have been reviewed and are unchanged unless specifically indicated below. ? ?No changes ? ?Medication List: ?Current Outpatient Medications  ?Medication Sig Dispense Refill  ? Ascorbic Acid (VITAMIN C) 250 MG CHEW Chew 250 mg by mouth.    ? azelastine (ASTELIN) 0.1 % nasal spray Place 2 sprays into both nostrils 2 (two) times daily. 90 mL 0  ?  betamethasone dipropionate (DIPROLENE) 0.05 % ointment     ? budesonide-formoterol (SYMBICORT) 160-4.5 MCG/ACT inhaler Inhale 2 puffs into the lungs 2 (two) times daily. 10.2 g 5  ? CALCIUM-MAGNESIUM-ZINC PO Take by mouth.    ? Carbinoxamine Maleate (RYVENT) 6 MG TABS Take '6mg'$  by mouth twice a day to control nasal symptoms 120 tablet 2  ? cholecalciferol (VITAMIN D3) 25 MCG (1000 UT) tablet Take 1,000 Units by mouth daily.    ? diclofenac sodium (VOLTAREN) 1 % GEL     ? Diclofenac Sodium 1.5 % SOLN Place onto the skin. Reported on 08/26/2015    ? docusate sodium (COLACE) 100 MG capsule Take 100 mg by mouth daily as needed for mild constipation.    ? fluticasone (FLONASE) 50 MCG/ACT nasal spray SHAKE LIQUID AND USE 2 SPRAYS IN EACH NOSTRIL DAILY 48 g 1  ? levalbuterol (XOPENEX HFA) 45 MCG/ACT inhaler Inhale 2 puffs into the lungs every 4 (four) hours as needed. 15 g 1  ? montelukast (SINGULAIR) 10 MG tablet TAKE 1 TABLET(10 MG) BY MOUTH AT BEDTIME 90 tablet 0  ? multivitamin-iron-minerals-folic acid (CENTRUM) chewable tablet Chew 1 tablet by mouth daily.    ? NEXLETOL 180 MG TABS Take 1 tablet by mouth daily.    ? olmesartan-hydrochlorothiazide (BENICAR HCT) 20-12.5 MG per tablet Take 1 tablet by mouth daily.    ? primidone (MYSOLINE) 50 MG tablet 4 in the AM, 1 at  night 450 tablet 2  ? ?No current facility-administered medications for this visit.  ?  ? ?Known medication allergies: ?Allergies  ?Allergen Reactions  ? Latex Shortness Of Breath and Other (See Comments)  ? ? ? ?Physical examination: ?Blood pressure 124/66, pulse 92, temperature (!) 96.2 ?F (35.7 ?C), resp. rate 18, height 5' 5.5" (1.664 m), weight 171 lb 6.4 oz (77.7 kg), SpO2 97 %. ? ?General: Alert, interactive, in no acute distress. ?HEENT: PERRLA, TMs pearly gray, turbinates non-edematous without discharge, post-pharynx non erythematous. ?Neck: Supple without lymphadenopathy. ?Lungs: Clear to auscultation without wheezing, rhonchi or rales. {no  increased work of breathing. ?CV: Normal S1, S2 without murmurs. ?Abdomen: Nondistended, nontender. ?Skin: Warm and dry, without lesions or rashes. ?Extremities:  No clubbing, cyanosis or edema. ?Neuro:   Grossly intact. ? ?Diagnositics/Labs: ? ?Spirometry: FEV1: 1.9 L 109%, FVC: 2.7 L 118%, ratio consistent with nonobstructive pattern ? ?Assessment and plan: ?  ?Cough ?Continue montelukast 10 mg once a day to prevent cough or wheeze ?Continue Symbicort 160-2 puffs once a day to prevent cough or wheeze ?Continue Xopenex 2 puffs once every 4-6 hours as needed for cough or wheeze ?For asthma flare, increase Symbicort 160 to 2 puffs twice a day with a spacer for 2 weeks or when cough and wheeze free.  Then return to previous dosing ? ?Allergic rhinitis ?Continue allergen avoidance measures ?Continue Flonase 1-2 sprays in each nostril daily for 1-2 weeks at a time before stopping once nasal congestion improves for maximum benefit ?Use Azelastine 2 sprays in each nostril twice a day now for nasal drainage control ?Continue ipratropium 2 sprays in each nostril up to three times a day as needed for a runny nose ?In the right nostril, point the applicator out toward the right ear. In the left nostril, point the applicator out toward the left ear ?Continue saline nasal rinses as needed for nasal symptoms. Use this before any medicated nasal sprays for best result ?Resume RyVent (carbinoxamine) 6-8 mg twice a day to control nasal symptoms.  We are sending Ryvent to [facility] (let us know if you get a call from them regarding prescription).    ? ?Follow up in 6 months or sooner if needed. ? ?I appreciate the opportunity to take part in Wyoma's care. Please do not hesitate to contact me with questions. ? ?Sincerely, ? ? ?Prudy Feeler, MD ?Allergy/Immunology ?[facility]  ? ? ?

## 2021-08-26 DIAGNOSIS — I10 Essential (primary) hypertension: Secondary | ICD-10-CM | POA: Diagnosis not present

## 2021-08-26 DIAGNOSIS — E1169 Type 2 diabetes mellitus with other specified complication: Secondary | ICD-10-CM | POA: Diagnosis not present

## 2021-08-26 DIAGNOSIS — M542 Cervicalgia: Secondary | ICD-10-CM | POA: Diagnosis not present

## 2021-08-30 ENCOUNTER — Other Ambulatory Visit: Payer: Self-pay

## 2021-08-30 ENCOUNTER — Other Ambulatory Visit: Payer: Self-pay | Admitting: Family Medicine

## 2021-08-30 ENCOUNTER — Ambulatory Visit
Admission: RE | Admit: 2021-08-30 | Discharge: 2021-08-30 | Disposition: A | Discharge status: Home / Self Care | Payer: Medicare Other | Source: Ambulatory Visit | Attending: Family Medicine | Admitting: Family Medicine

## 2021-08-30 DIAGNOSIS — M50323 Other cervical disc degeneration at C6-C7 level: Secondary | ICD-10-CM | POA: Diagnosis not present

## 2021-08-30 DIAGNOSIS — M542 Cervicalgia: Secondary | ICD-10-CM | POA: Diagnosis not present

## 2021-08-31 ENCOUNTER — Other Ambulatory Visit: Payer: Self-pay | Admitting: Allergy

## 2021-09-02 DIAGNOSIS — Z780 Asymptomatic menopausal state: Secondary | ICD-10-CM | POA: Diagnosis not present

## 2021-09-07 DIAGNOSIS — D704 Cyclic neutropenia: Secondary | ICD-10-CM | POA: Diagnosis not present

## 2021-09-08 NOTE — Therapy (Signed)
?OUTPATIENT PHYSICAL THERAPY CERVICAL EVALUATION ? ? ?Patient Name: [name] ?MRN: [id number] ?DOB:1940/09/02, 81 y.o., female ?Today's Date: 09/09/2021 ? ? PT End of Session - 09/09/21 0949   ? ? Visit Number 1   ? Number of Visits 12   ? Date for PT Re-Evaluation 10/21/21   ? Authorization Type MCR AARP 10th visit progress note 6th FOTO   ? PT Start Time 9735   ? PT Stop Time 1015   ? PT Time Calculation (min) 40 min   ? Activity Tolerance Patient tolerated treatment well   ? Behavior During Therapy [facility] for tasks assessed/performed   ? ?  ?  ? ?  ? ? ?Past Medical History:  ?Diagnosis Date  ? Asthma   ? allergic induced  ? Cough   ? Hypertension   ? Rhinitis   ? ?Past Surgical History:  ?Procedure Laterality Date  ? ABDOMINAL HYSTERECTOMY    ? BACK SURGERY    ? CATARACT EXTRACTION    ? MASS EXCISION  01/05/2012  ? Procedure: MINOR EXCISION OF MASS;  Surgeon: Cammie Sickle., MD;  Location: [place];  Service: Orthopedics;  Laterality: Right;  excision mucoid cyst right long finger, debride DIP joint  ? ROTATOR CUFF REPAIR Bilateral   ? TOTAL KNEE ARTHROPLASTY Left   ? twice  ? ?Patient Active Problem List  ? Diagnosis Date Noted  ? Reactive airway disease 07/29/2020  ? Seasonal and perennial allergic rhinitis 07/29/2020  ? Gastroesophageal reflux disease 07/29/2020  ? Unilateral primary osteoarthritis, right knee 11/21/2017  ? Status post revision of total knee, left 11/21/2017  ? Chronic rhinitis 02/25/2016  ? Cough 02/25/2016  ? Hoarseness of voice 02/25/2016  ? Chronic leukopenia 12/15/2015  ? ? ?PCP: Lucianne Lei, MD ? ?REFERRING PROVIDER: Lucianne Lei, MD ? ?REFERRING DIAG: pain in neck radiating to both shoulders ? ?THERAPY DIAG:  ?Cervicalgia ? ?Abnormal posture ? ?Muscle weakness (generalized) ? ?ONSET DATE: neck pain started January2023 ? ?SUBJECTIVE:                                                                                                                                                                                                         ? ?SUBJECTIVE STATEMENT: ?I have pain in my neck  when I look up and when I turn to my right but more pain when I turn Left.  It is not as bad as it was.  My neck pain goes into my upper  shoulders  but I dont have tingling into my hands ? ?  PERTINENT HISTORY:  ?HTN, asthma ? ?PAIN:  ?Are you having pain? Yes: NPRS scale: 4/10 ?Pain location: neck dorso cervical fat pad radiates into scapula bil R>L ?Pain description: aching, sometimes sharp ?Aggravating factors: turning your neck ,driving and looking for blind spots , sleeping , prepping food in the kitchen ?Relieving factors: take motrin ?Pain at rest 4/10 but when turning neck pain is 5/10 ?PRECAUTIONS: None ? ?WEIGHT BEARING RESTRICTIONS No ? ?FALLS:  ?Has patient fallen in last 6 months? No ?Number of falls: 0 ? ?LIVING ENVIRONMENT: ?Lives with: lives with their family and lives alone ?Lives in: House/apartment ?Stairs: Yes: Internal: 16 steps; on right going up and External: 1 steps; none ?Has following equipment at home: Single point cane and Walker - 2 wheeled ? ?OCCUPATION: retired  [employer] ? ?PLOF: Independent ? ?PATIENT GOALS have better posture and decrease pain with my household chores and driving and get stronger ? ?OBJECTIVE:  ? ?DIAGNOSTIC FINDINGS: 08-30-21 ?IMPRESSION: ?1. C4-5-C6-7 degenerative disc disease with associated uncovertebral ?hypertrophy and facet sclerosis. Facet sclerosis has progressed from ?04/15/2008. ?2. Multilevel bilateral neural foraminal narrowing. ? ?PATIENT SURVEYS:  ?FOTO 52 % predicted 63% ? ? ?COGNITION: ?Overall cognitive status: Within functional limits for tasks assessed ? ? ?SENSATION: ?WFL ? ?POSTURE:  ?Forward Head, dorsocervical fat pad ? ?PALPATION: ?TTP over  ? ?CERVICAL ROM:  ? ?Active ROM A/PROM (deg) ?09/09/2021  ?Flexion 33  ?Extension 30 with pain  ?Right lateral flexion 15 with pain  ?Left lateral flexion  19with pain  ?Right rotation 40  ?Left rotation 49  ? (Blank rows = not tested) ? ?UE ROM: ? ?Active ROM Right APROM ?09/09/2021 Left ?09/09/2021  ?Shoulder flexion 142/155 141/151  ?Shoulder extension    ?Shoulder abduction 131 135  ?Shoulder adduction    ?Shoulder extension    ?Shoulder internal rotation 42 53  ?Shoulder external rotation 85 90  ?Elbow flexion    ?Elbow extension    ? (Blank rows = not tested) ? ?UE MMT: ? ?MMT Right ?09/09/2021 Left ?09/09/2021  ?Shoulder flexion 4+ 4+  ?Shoulder extension 4+ 4+  ?Shoulder abduction 4 4  ?Shoulder adduction    ?Shoulder extension    ?Shoulder internal rotation 4 4  ?Shoulder external rotation 4- 4-  ?Middle trapezius    ?Lower trapezius    ?Elbow flexion    ?Elbow extension    ?Wrist supination    ?Grip strength 45.3 43  ? (Blank rows = not tested) ? ?CERVICAL SPECIAL TESTS:  ?Neck flexor muscle endurance test: Positive, Upper limb tension test (ULTT): Negative, Spurling's test: Negative, and Distraction test: Negative ?Supine flexion endurance test 19 sec  ( normal 35 SEc) ? ?FUNCTIONAL TESTS:  ?5 times sit to stand: 34.61 ?Pt with left TKA limited flexion 50 degrees ? ?PATIENT SURVEYS:  ?FOTO 52 % predicted 63% ? ?TODAY'S TREATMENT:  ?09-09-21 Initial HEP issued EVAL ? ? ?PATIENT EDUCATION:  ?Education details: POC, posture, initial HEP ?Person educated: Patient ?Education method: Explanation, Demonstration, Tactile cues, Verbal cues, and Handouts ?Education comprehension: verbalized understanding, returned demonstration, verbal cues required, and needs further education ? ? ?HOME EXERCISE PROGRAM: ? ?Access Code: [email or web address] ?URL: [email or web address] ?Date: 09/09/2021 ?Prepared by: Voncille Lo ? ?Exercises ?- Supine Deep Neck Flexor Training  - 2-3 x daily - 7 x weekly - 10 reps - 3 hold ?- Seated Cervical Retraction Protraction AROM  - 2-3 x daily - 7 x weekly - 1 sets - 10 reps ?- Seated Gentle Upper Trapezius  Stretch  - 1 x daily - 7 x weekly -  1 sets - 3 reps - 30 hold ?- Gentle Levator Scapulae Stretch  - 1 x daily - 7 x weekly - 3 sets - 3 reps - 30 hold ? ? ?ASSESSMENT: ? ?CLINICAL IMPRESSION: ?Patient is a 81  y.o. female who was seen today for physical therapy evaluation and treatment for neck pain since January 2023 with no incident and limiting motion for driving, and household chores, sleeping.  Pt will benefit from skilled PT to reduce pain and return to pain free motion.  ? ? ?OBJECTIVE IMPAIRMENTS decreased ROM, decreased strength, impaired UE functional use, postural dysfunction, and pain.  ? ?ACTIVITY LIMITATIONS cleaning, driving, meal prep, and laundry.  ? ?PERSONAL FACTORS 1 comorbidity: HTN, asthma restricted knee flexion to 50  are also affecting patient's functional outcome.  ? ? ?REHAB POTENTIAL: Excellent ? ?CLINICAL DECISION MAKING: Evolving/moderate complexity ? ?EVALUATION COMPLEXITY: Moderate ? ? ?GOALS: ?Goals reviewed with patient? Yes ? ?SHORT TERM GOALS: Target date: 09/30/2021 ? ?Pt will be independent with intial HEP ?Baseline: no knowledge ?Goal status: INITIAL ? ?2.  Pt will be 50% more aware of posture throughout the day ?Baseline: Pt with forward head and dorsocervical fat pad ?Goal status: INITIAL ? ?3.  Pt will increase AROM of rotation by 25 % ?Baseline: See flow chart for cervical AROM ?Goal status: INITIAL ? ? ? ?LONG TERM GOALS: Target date: 10/21/2021 ? ?Pt will be independent with advanced HEP ?Baseline: no knowledge ?Goal status: INITIAL ? ?2.  Pt will be able to maintain cervical supine flexion test for 35 sec ?Baseline: 19 seconds ?Goal status: INITIAL ? ?3.  Pt will be able to fall asleep 50% better using good positioning techniques to  fall asleep ?Baseline: Pt unable to fall asleep until midnight ?Goal status: INITIAL ? ?4.  Pt will be able to turn neck for driving without exacerbating pain ?Baseline: Pt with pain with cervical AROM in all motions. ?Goal status: INITIAL ? ?5.  Pt will  be able to lift items  above shld level for household chores with pain 1/10 or less ?Baseline: 5/10 at eval with movement ?Goal status: INITIAL ? ?6.  FOTO will improve from 52%    to  63%   indicating improved functional mobil

## 2021-09-09 ENCOUNTER — Ambulatory Visit: Payer: Medicare Other | Attending: Family Medicine | Admitting: Physical Therapy

## 2021-09-09 ENCOUNTER — Other Ambulatory Visit: Payer: Self-pay

## 2021-09-09 DIAGNOSIS — R293 Abnormal posture: Secondary | ICD-10-CM | POA: Insufficient documentation

## 2021-09-09 DIAGNOSIS — M25561 Pain in right knee: Secondary | ICD-10-CM | POA: Insufficient documentation

## 2021-09-09 DIAGNOSIS — M542 Cervicalgia: Secondary | ICD-10-CM | POA: Diagnosis not present

## 2021-09-09 DIAGNOSIS — R262 Difficulty in walking, not elsewhere classified: Secondary | ICD-10-CM | POA: Diagnosis not present

## 2021-09-09 DIAGNOSIS — M25661 Stiffness of right knee, not elsewhere classified: Secondary | ICD-10-CM | POA: Insufficient documentation

## 2021-09-09 DIAGNOSIS — M6281 Muscle weakness (generalized): Secondary | ICD-10-CM | POA: Insufficient documentation

## 2021-09-09 DIAGNOSIS — G8929 Other chronic pain: Secondary | ICD-10-CM | POA: Insufficient documentation

## 2021-09-09 DIAGNOSIS — M25662 Stiffness of left knee, not elsewhere classified: Secondary | ICD-10-CM | POA: Insufficient documentation

## 2021-09-09 DIAGNOSIS — M25562 Pain in left knee: Secondary | ICD-10-CM | POA: Insufficient documentation

## 2021-09-09 NOTE — Patient Instructions (Signed)
?  Access Code: [email or web address] ?URL: [email or web address] ?Date: 09/09/2021 ?Prepared by: Voncille Lo ? ?Exercises ?- Supine Deep Neck Flexor Training  - 2-3 x daily - 7 x weekly - 10 reps - 3 hold ?- Seated Cervical Retraction Protraction AROM  - 2-3 x daily - 7 x weekly - 1 sets - 10 reps ?- Seated Gentle Upper Trapezius Stretch  - 1 x daily - 7 x weekly - 1 sets - 3 reps - 30 hold ?- Gentle Levator Scapulae Stretch  - 1 x daily - 7 x weekly - 3 sets - 3 reps - 30 hold ? ? ?Posture Tips ?DO: - stand tall and erect - keep chin tucked in - keep head and shoulders in alignment - check posture regularly in mirror or large window - pull head back against headrest in car seat;  Change your position often.  Sit with lumbar support. ?DON'T: - slouch or slump while watching TV or reading - sit, stand or lie in one position  for too long;  Sitting is especially hard on the spine so if you sit at a desk/use the computer, then stand up often! ? ? ?Copyright ? VHI. All rights reserved.  ?Posture - Standing ? ? ?Good posture is important. Avoid slouching and forward head thrust. Maintain curve in low back and align ears over shoul- ders, hips over ankles.  Pull your belly button in toward your back bone. Stand with equal weight on your feet.  Dont just stand with hands on hip pose.  Ribs lifted up and chin down. ? ? ?Copyright ? VHI. All rights reserved.  ?Posture - Sitting ? ? ?Sit upright, head facing forward. Try using a roll to support lower back. Keep shoulders relaxed, and avoid rounded back. Keep hips level with knees. Avoid crossing legs for long periods.Dont cross legs,  Sit on your sit bone not your tail bone. ? ? ?Copyright ? VHI. All rights reserved.   ? ? ?Voncille Lo, PT, ATRIC ?Certified Exercise Expert for the Aging Adult  ?09/09/21 9:54 AM ?Phone: [phone] ?Fax: [phone]  ?

## 2021-09-15 DIAGNOSIS — Z1231 Encounter for screening mammogram for malignant neoplasm of breast: Secondary | ICD-10-CM | POA: Diagnosis not present

## 2021-09-15 NOTE — Therapy (Signed)
?OUTPATIENT PHYSICAL THERAPY TREATMENT NOTE ? ? ?Patient Name: [name] ?MRN: [id number] ?DOB:July 25, 1940, 81 y.o., female ?Today's Date: 09/16/2021 ? ?PCP: Lucianne Lei, MD ?REFERRING PROVIDER: Lucianne Lei, MD ? ? PT End of Session - 09/16/21 1146   ? ? Visit Number 2   ? Number of Visits 12   ? Date for PT Re-Evaluation 10/21/21   ? Authorization Type MCR AARP 10th visit progress note 6th FOTO   ? PT Start Time 1147   ? PT Stop Time 1230   ? PT Time Calculation (min) 43 min   ? Activity Tolerance Patient tolerated treatment well   ? Behavior During Therapy [facility] for tasks assessed/performed   ? ?  ?  ? ?  ? ? ?Past Medical History:  ?Diagnosis Date  ? Asthma   ? allergic induced  ? Cough   ? Hypertension   ? Rhinitis   ? ?Past Surgical History:  ?Procedure Laterality Date  ? ABDOMINAL HYSTERECTOMY    ? BACK SURGERY    ? CATARACT EXTRACTION    ? MASS EXCISION  01/05/2012  ? Procedure: MINOR EXCISION OF MASS;  Surgeon: Cammie Sickle., MD;  Location: [place];  Service: Orthopedics;  Laterality: Right;  excision mucoid cyst right long finger, debride DIP joint  ? ROTATOR CUFF REPAIR Bilateral   ? TOTAL KNEE ARTHROPLASTY Left   ? twice  ? ?Patient Active Problem List  ? Diagnosis Date Noted  ? Reactive airway disease 07/29/2020  ? Seasonal and perennial allergic rhinitis 07/29/2020  ? Gastroesophageal reflux disease 07/29/2020  ? Unilateral primary osteoarthritis, right knee 11/21/2017  ? Status post revision of total knee, left 11/21/2017  ? Chronic rhinitis 02/25/2016  ? Cough 02/25/2016  ? Hoarseness of voice 02/25/2016  ? Chronic leukopenia 12/15/2015  ? ? ?REFERRING DIAG: pain in neck radiating to both shoulders ? ?THERAPY DIAG:  ?Cervicalgia ? ?Abnormal posture ? ?Muscle weakness (generalized) ? ?Chronic pain of left knee ? ?Chronic pain of right knee ? ?Stiffness of left knee, not elsewhere classified ? ?Stiffness of right knee, not elsewhere classified ? ?Difficulty in walking, not  elsewhere classified ? ?PERTINENT HISTORY: HTN, asthma ? ?PRECAUTIONS: none ? ?SUBJECTIVE: Pt returns with 4-5/10 pain and tried to do exercises at home ? ?PAIN:  ?Are you having pain? Are you having pain? Yes: NPRS scale: 4/10 ?Pain location: neck dorso cervical fat pad radiates into scapula bil R>L ?Pain description: aching, sometimes sharp ?Aggravating factors: turning your neck ,driving and looking for blind spots , sleeping , prepping food in the kitchen ?Relieving factors: take motrin ?Pain at rest 4/10 but when turning neck pain is 5/10 ? ? ? ? ?OBJECTIVE:  ?  ?DIAGNOSTIC FINDINGS: 08-30-21 ?IMPRESSION: ?1. C4-5-C6-7 degenerative disc disease with associated uncovertebral ?hypertrophy and facet sclerosis. Facet sclerosis has progressed from ?04/15/2008. ?2. Multilevel bilateral neural foraminal narrowing. ?  ?PATIENT SURVEYS:  ?FOTO 52 % predicted 63% ?  ?  ?COGNITION: ?Overall cognitive status: Within functional limits for tasks assessed ?  ?  ?SENSATION: ?WFL ?  ?POSTURE:  ?Forward Head, dorsocervical fat pad ?  ?PALPATION: ?TTP over        ?  ?CERVICAL ROM:  ?  ?Active ROM A/PROM (deg) ?09/09/2021 AROM ?Post TPDN ?09-16-21  ?Flexion 33   ?Extension 30 with pain   ?Right lateral flexion 15 with pain 20  ?Left lateral flexion 19with pain 20  ?Right rotation 40 45  ?Left rotation 49 50  ? (Blank rows =  not tested) ?  ?UE ROM: ?  ?Active ROM Right APROM ?09/09/2021 Left ?09/09/2021  ?Shoulder flexion 142/155 141/151  ?Shoulder extension      ?Shoulder abduction 131 135  ?Shoulder adduction      ?Shoulder extension      ?Shoulder internal rotation 42 53  ?Shoulder external rotation 85 90  ?Elbow flexion      ?Elbow extension      ? (Blank rows = not tested) ?  ?UE MMT: ?  ?MMT Right ?09/09/2021 Left ?09/09/2021  ?Shoulder flexion 4+ 4+  ?Shoulder extension 4+ 4+  ?Shoulder abduction 4 4  ?Shoulder adduction      ?Shoulder extension      ?Shoulder internal rotation 4 4  ?Shoulder external rotation 4- 4-  ?Middle  trapezius      ?Lower trapezius      ?Elbow flexion      ?Elbow extension      ?Wrist supination      ?Grip strength 45.3 43  ? (Blank rows = not tested) ?  ?CERVICAL SPECIAL TESTS:  ?Neck flexor muscle endurance test: Positive, Upper limb tension test (ULTT): Negative, Spurling's test: Negative, and Distraction test: Negative ?Supine flexion endurance test 19 sec  ( normal 35 SEc) ?  ?FUNCTIONAL TESTS:  ?5 times sit to stand: 34.61 ?Pt with left TKA limited flexion 50 degrees ?  ?PATIENT SURVEYS:  ?FOTO 52 % predicted 63% ?  ?TODAY'S TREATMENT:  ? ?[place] Adult PT Treatment:                                                DATE: 09-16-21 ?Go over FOTO report and  TPDN ?Therapeutic Exercise: ?Supine Deep Neck Flexor Training  10 x 10 sec hold ?Seated Cervical Retraction Protraction AROM   2 x 10 ?Seated Gentle Upper Trapezius Stretch -3 reps - 30 sec  hold ?Gentle Levator Scapulae Stretch  4 reps  R and L - 30  sec hold ? Shoulder Diagonal Horizontal Abduction RTB - 3 x 10 ?Shoulder Horizontal Abduction with RTB  3 x 10  ?Seated Assisted Cervical Rotation with Towel  10 reps - 10-15 sec hold ?Manual Therapy: ?STW over bil upper trap and levator ?In prone, R UPA C-3 to C-6,  in supine  PA C-3 to C-7 with gentle overpressure for lateral glides and rotation ?Suboccipital release ?Trigger Point Dry Needling Treatment: ?Pre-treatment instruction: Patient instructed on dry needling rationale, procedures, and possible side effects including pain during treatment (achy,cramping feeling), bruising, drop of blood, lightheadedness, nausea, sweating. ?Patient Consent Given: Yes ?Education handout provided: Yes ?Muscles treated: bil Upper trap and levator  ?Needle size and number: .30x51m x 2 and .25x321mx 2 ?Electrical stimulation performed: No ?Parameters: N/A ?Treatment response/outcome: Twitch response elicited and Palpable decrease in muscle tension ?Post-treatment instructions: Patient instructed to expect possible mild to  moderate muscle soreness later today and/or tomorrow. Patient instructed in methods to reduce muscle soreness and to continue prescribed HEP. If patient was dry needled over the lung field, patient was instructed on signs and symptoms of pneumothorax and, however unlikely, to see immediate medical attention should they occur. Patient was also educated on signs and symptoms of infection and to seek medical attention should they occur. Patient verbalized understanding of these instructions and education.  ? ?09-09-21 Initial HEP issued EVAL ?  ?  ?PATIENT  EDUCATION:  ?Education details: POC, posture, initial HEP, Foto report  TPDN education ?Person educated: Patient ?Education method: Explanation, Demonstration, Tactile cues, Verbal cues, and Handouts ?Education comprehension: verbalized understanding, returned demonstration, verbal cues required, and needs further education ?  ?  ?HOME EXERCISE PROGRAM: ?  ?Access Code: [email or web address] ?URL: [email or web address] ?Date: 09/09/2021 ?Prepared by: Voncille Lo ?  ?Exercises ?- Supine Deep Neck Flexor Training  - 2-3 x daily - 7 x weekly - 10 reps - 3 hold ?- Seated Cervical Retraction Protraction AROM  - 2-3 x daily - 7 x weekly - 1 sets - 10 reps ?- Seated Gentle Upper Trapezius Stretch  - 1 x daily - 7 x weekly - 1 sets - 3 reps - 30 hold ?- Gentle Levator Scapulae Stretch  - 1 x daily - 7 x weekly - 3 sets - 3 reps - 30 hold ? ?Added on 09-16-21 ? ?Program Notes ?Star pattern red theraband bilateral arm across body and diagnonals  3 x 10 (combo of exercise below ? ?- Standing Shoulder Diagonal Horizontal Abduction 60/120 Degrees with Resistance  - 1 x daily - 7 x weekly - 3 sets - 10 reps ?- Standing Shoulder Horizontal Abduction with Resistance  - 1 x daily - 7 x weekly - 3 sets - 10 reps  ?- Seated Assisted Cervical Rotation with Towel  - 1 x daily - 7 x weekly - 1 sets - 10 reps - 10-15 sec hold ?  ?ASSESSMENT: ?  ?CLINICAL IMPRESSION: ?Pt  returns to  clinic with 5/10 pain but appreciates the stretches given at eval.  Pt consents to TPDN and was closely monitored throughout session. Pt with decreased muscle tension post TPDN and increased AROM cervical  ( See fl

## 2021-09-16 ENCOUNTER — Ambulatory Visit: Payer: Medicare Other | Admitting: Physical Therapy

## 2021-09-16 ENCOUNTER — Encounter: Payer: Self-pay | Admitting: Physical Therapy

## 2021-09-16 DIAGNOSIS — R293 Abnormal posture: Secondary | ICD-10-CM

## 2021-09-16 DIAGNOSIS — M6281 Muscle weakness (generalized): Secondary | ICD-10-CM | POA: Diagnosis not present

## 2021-09-16 DIAGNOSIS — G8929 Other chronic pain: Secondary | ICD-10-CM

## 2021-09-16 DIAGNOSIS — M25661 Stiffness of right knee, not elsewhere classified: Secondary | ICD-10-CM

## 2021-09-16 DIAGNOSIS — M25561 Pain in right knee: Secondary | ICD-10-CM | POA: Diagnosis not present

## 2021-09-16 DIAGNOSIS — M542 Cervicalgia: Secondary | ICD-10-CM

## 2021-09-16 DIAGNOSIS — M25662 Stiffness of left knee, not elsewhere classified: Secondary | ICD-10-CM

## 2021-09-16 DIAGNOSIS — R262 Difficulty in walking, not elsewhere classified: Secondary | ICD-10-CM

## 2021-09-16 DIAGNOSIS — M25562 Pain in left knee: Secondary | ICD-10-CM | POA: Diagnosis not present

## 2021-09-16 NOTE — Patient Instructions (Signed)
? ?  Trigger Point Dry Needling ? ?What is Trigger Point Dry Needling (DN)? ?DN is a physical therapy technique used to treat muscle pain and dysfunction. Specifically, DN helps deactivate muscle trigger points (muscle knots).  ?A thin filiform needle is used to penetrate the skin and stimulate the underlying trigger point. The goal is for a local twitch response (LTR) to occur and for the trigger point to relax. No medication of any kind is injected during the procedure.  ? ?What Does Trigger Point Dry Needling Feel Like?  ?The procedure feels different for each individual patient. Some patients report that they do not actually feel the needle enter the skin and overall the process is not painful. Very mild bleeding may occur. However, many patients feel a deep cramping in the muscle in which the needle was inserted. This is the local twitch response.  ? ?How Will I feel after the treatment? ?Soreness is normal, and the onset of soreness may not occur for a few hours. Typically this soreness does not last longer than two days.  ?Bruising is uncommon, however; ice can be used to decrease any possible bruising.  ?In rare cases feeling tired or nauseous after the treatment is normal. In addition, your symptoms may get worse before they get better, this period will typically not last longer than 24 hours.  ? ?What Can I do After My Treatment? ?Increase your hydration by drinking more water for the next 24 hours. ?You may place ice or heat on the areas treated that have become sore, however, do not use heat on inflamed or bruised areas. Heat often brings more relief post needling. ?You can continue your regular activities, but vigorous activity is not recommended initially after the treatment for 24 hours. ?DN is best combined with other physical therapy such as strengthening, stretching, and other therapies.   ?Voncille Lo, PT, [place] ?Certified Exercise Expert for the Aging Adult  ?09/16/21 11:56 AM ?Phone:  [phone] ?Fax: [phone]  ?

## 2021-09-20 NOTE — Therapy (Signed)
?OUTPATIENT PHYSICAL THERAPY TREATMENT NOTE ? ? ?Patient Name: [name] ?MRN: [id number] ?DOB:1941/01/18, 81 y.o., female ?Today's Date: 09/21/2021 ? ?PCP: Lucianne Lei, MD ?REFERRING PROVIDER: Lucianne Lei, MD ? ? PT End of Session - 09/21/21 0850   ? ? Visit Number 3   ? Number of Visits 12   ? Date for PT Re-Evaluation 10/21/21   ? Authorization Type MCR AARP 10th visit progress note 6th FOTO   ? PT Start Time [phone]   ? PT Stop Time 1191   ? PT Time Calculation (min) 45 min   ? Activity Tolerance Patient tolerated treatment well   ? Behavior During Therapy [facility] for tasks assessed/performed   ? ?  ?  ? ?  ? ? ? ?Past Medical History:  ?Diagnosis Date  ? Asthma   ? allergic induced  ? Cough   ? Hypertension   ? Rhinitis   ? ?Past Surgical History:  ?Procedure Laterality Date  ? ABDOMINAL HYSTERECTOMY    ? BACK SURGERY    ? CATARACT EXTRACTION    ? MASS EXCISION  01/05/2012  ? Procedure: MINOR EXCISION OF MASS;  Surgeon: Cammie Sickle., MD;  Location: [facility];  Service: Orthopedics;  Laterality: Right;  excision mucoid cyst right long finger, debride DIP joint  ? ROTATOR CUFF REPAIR Bilateral   ? TOTAL KNEE ARTHROPLASTY Left   ? twice  ? ?Patient Active Problem List  ? Diagnosis Date Noted  ? Reactive airway disease 07/29/2020  ? Seasonal and perennial allergic rhinitis 07/29/2020  ? Gastroesophageal reflux disease 07/29/2020  ? Unilateral primary osteoarthritis, right knee 11/21/2017  ? Status post revision of total knee, left 11/21/2017  ? Chronic rhinitis 02/25/2016  ? Cough 02/25/2016  ? Hoarseness of voice 02/25/2016  ? Chronic leukopenia 12/15/2015  ? ? ?REFERRING DIAG: pain in neck radiating to both shoulders ? ?THERAPY DIAG:  ?Cervicalgia ? ?Abnormal posture ? ?Muscle weakness (generalized) ? ?Chronic pain of left knee ? ?Chronic pain of right knee ? ?Stiffness of left knee, not elsewhere classified ? ?Stiffness of right knee, not elsewhere classified ? ?Difficulty in walking, not  elsewhere classified ? ?PERTINENT HISTORY: HTN, asthma ? ?PRECAUTIONS: none ? ?SUBJECTIVE: I was sore after with TPDN for about a day.  I tried to work through it . My exercises are doing OK  I am doing them twice a day. In AM and PM. I have noticed that when I am driving it is better when I turn my neck. I feel my pain more on my Left side but I do feel it on my right tightness on the R ? ?PAIN:  ?Are you having pain? Are you having pain? Yes: NPRS scale: 2/10 ?Pain location: neck dorso cervical fat pad radiates into scapula bil R>L ?Pain description: aching, sometimes sharp ?Aggravating factors: turning your neck ,driving and looking for blind spots , sleeping , prepping food in the kitchen ?Relieving factors: take motrin ?Pain at rest 4/10 but when turning neck pain is 5/10 ? ? ? ? ?OBJECTIVE:  ?  ?DIAGNOSTIC FINDINGS: 08-30-21 ?IMPRESSION: ?1. C4-5-C6-7 degenerative disc disease with associated uncovertebral ?hypertrophy and facet sclerosis. Facet sclerosis has progressed from ?04/15/2008. ?2. Multilevel bilateral neural foraminal narrowing. ?  ?PATIENT SURVEYS:  ?FOTO 52 % predicted 63% ?  ?  ?COGNITION: ?Overall cognitive status: Within functional limits for tasks assessed ?  ?  ?SENSATION: ?WFL ?  ?POSTURE:  ?Forward Head, dorsocervical fat pad ?  ?PALPATION: ?TTP over        ?  ?  CERVICAL ROM:  ?  ?Active ROM A/PROM (deg) ?09/09/2021 AROM ?Post TPDN ?09-16-21  ?Flexion 33   ?Extension 30 with pain   ?Right lateral flexion 15 with pain 20  ?Left lateral flexion 19with pain 20  ?Right rotation 40 45  ?Left rotation 49 50  ? (Blank rows = not tested) ?  ?UE ROM: ?  ?Active ROM Right APROM ?09/09/2021 Left ?09/09/2021  ?Shoulder flexion 142/155 141/151  ?Shoulder extension      ?Shoulder abduction 131 135  ?Shoulder adduction      ?Shoulder extension      ?Shoulder internal rotation 42 53  ?Shoulder external rotation 85 90  ?Elbow flexion      ?Elbow extension      ? (Blank rows = not tested) ?  ?UE MMT: ?  ?MMT  Right ?09/09/2021 Left ?09/09/2021  ?Shoulder flexion 4+ 4+  ?Shoulder extension 4+ 4+  ?Shoulder abduction 4 4  ?Shoulder adduction      ?Shoulder extension      ?Shoulder internal rotation 4 4  ?Shoulder external rotation 4- 4-  ?Middle trapezius      ?Lower trapezius      ?Elbow flexion      ?Elbow extension      ?Wrist supination      ?Grip strength 45.3 43  ? (Blank rows = not tested) ?  ?CERVICAL SPECIAL TESTS:  ?Neck flexor muscle endurance test: Positive, Upper limb tension test (ULTT): Negative, Spurling's test: Negative, and Distraction test: Negative ?Supine flexion endurance test 19 sec  ( normal 35 SEc) ?  ?FUNCTIONAL TESTS:  ?5 times sit to stand: 34.61 ?Pt with left TKA limited flexion 50 degrees ?  ?PATIENT SURVEYS:  ?FOTO 52 % predicted 63% ?  ?TODAY'S TREATMENT:  ?[facility] Adult PT Treatment:                                                DATE: 09-21-21 ?Therapeutic Exercise: ?Supine Deep Neck Flexor Training  5 x 10 sec hold ?Supine flexion and rotate 10 x to R and L ?Shoulder Diagonal Horizontal Abduction GTB - 3 x 10 Vc for straight elbows ?Shoulder Horizontal Abduction with GTB  3 x 10  ?Seated Assisted Cervical Rotation with Towel  10 reps - 10-15 sec hold ?Manual Therapy: ?STW over bil upper trap and levator ?In prone, R UPA C-3 to C-6,  in supine  PA C-3 to C-7 with gentle overpressure for lateral glides and rotation ?Suboccipital release ?Trigger Point Dry Needling Treatment: ?Pre-treatment instruction: Patient instructed on dry needling rationale, procedures, and possible side effects including pain during treatment (achy,cramping feeling), bruising, drop of blood, lightheadedness, nausea, sweating. ?Patient Consent Given: Yes ?Education handout provided: Previously provided ?Muscles treated: bil upper trap and levator  L> R . C-3 to C-6 bil cervical paraspinals ?Needle size and number: .25x35m x 8 ?Electrical stimulation performed: No ?Parameters: N/A ?Treatment response/outcome: Twitch response  elicited and Palpable decrease in muscle tension ?Post-treatment instructions: Patient instructed to expect possible mild to moderate muscle soreness later today and/or tomorrow. Patient instructed in methods to reduce muscle soreness and to continue prescribed HEP. If patient was dry needled over the lung field, patient was instructed on signs and symptoms of pneumothorax and, however unlikely, to see immediate medical attention should they occur. Patient was also educated on signs and symptoms of infection and  to seek medical attention should they occur. Patient verbalized understanding of these instructions and education.  ? ?Modalities: ?Moist hot pack while educating on posture, body mechanics and ADL /pain manangement helps ?Self Care: ?Pt posture sitting and standing ?ADL with correct posture and back/neck relieving positions. ?Verbally explanation of correct lifting with Handout. ?Pain management strategies with household chores and endurance ? ? ? ? ?  ?[facility] Adult PT Treatment:                                                DATE: 09-16-21 ?Go over FOTO report and  TPDN ?Therapeutic Exercise: ?Supine Deep Neck Flexor Training  10 x 10 sec hold ?Seated Cervical Retraction Protraction AROM   2 x 10 ?Seated Gentle Upper Trapezius Stretch -3 reps - 30 sec  hold ?Gentle Levator Scapulae Stretch  4 reps  R and L - 30  sec hold ? Shoulder Diagonal Horizontal Abduction RTB - 3 x 10 ?Shoulder Horizontal Abduction with RTB  3 x 10  ?Seated Assisted Cervical Rotation with Towel  10 reps - 10-15 sec hold ?Manual Therapy: ?STW over bil upper trap and levator ?In prone, R UPA C-3 to C-6,  in supine  PA C-3 to C-7 with gentle overpressure for lateral glides and rotation ?Suboccipital release ?Trigger Point Dry Needling Treatment: ?Pre-treatment instruction: Patient instructed on dry needling rationale, procedures, and possible side effects including pain during treatment (achy,cramping feeling), bruising, drop of blood,  lightheadedness, nausea, sweating. ?Patient Consent Given: Yes ?Education handout provided: Yes ?Muscles treated: bil Upper trap and levator  ?Needle size and number: .30x67m x 2 and .25x328mx 2 ?[employer]

## 2021-09-21 ENCOUNTER — Encounter: Payer: Self-pay | Admitting: Physical Therapy

## 2021-09-21 ENCOUNTER — Ambulatory Visit: Payer: Medicare Other | Attending: Family Medicine | Admitting: Physical Therapy

## 2021-09-21 DIAGNOSIS — M6281 Muscle weakness (generalized): Secondary | ICD-10-CM

## 2021-09-21 DIAGNOSIS — R293 Abnormal posture: Secondary | ICD-10-CM

## 2021-09-21 DIAGNOSIS — M25562 Pain in left knee: Secondary | ICD-10-CM | POA: Insufficient documentation

## 2021-09-21 DIAGNOSIS — G8929 Other chronic pain: Secondary | ICD-10-CM | POA: Diagnosis not present

## 2021-09-21 DIAGNOSIS — R262 Difficulty in walking, not elsewhere classified: Secondary | ICD-10-CM | POA: Diagnosis not present

## 2021-09-21 DIAGNOSIS — M542 Cervicalgia: Secondary | ICD-10-CM

## 2021-09-21 DIAGNOSIS — M25661 Stiffness of right knee, not elsewhere classified: Secondary | ICD-10-CM

## 2021-09-21 DIAGNOSIS — M25662 Stiffness of left knee, not elsewhere classified: Secondary | ICD-10-CM

## 2021-09-21 DIAGNOSIS — M25561 Pain in right knee: Secondary | ICD-10-CM | POA: Diagnosis not present

## 2021-09-21 NOTE — Patient Instructions (Signed)
Sleeping on Back ? ?Place pillow under knees. A pillow with cervical support and a roll around waist are also helpful. ?Copyright ? VHI. All rights reserved.  Sleeping on Side ?Place pillow between knees. Use cervical support under neck and a roll around waist as needed. ?Copyright ? VHI. All rights reserved.   ?Sleeping on Stomach ? ? ?If this is the only desirable sleeping position, place pillow under lower legs, and under stomach or chest as needed. ? ?Posture - Sitting ? ? ?Sit upright, head facing forward. Try using a roll to support lower back. Keep shoulders relaxed, and avoid rounded back. Keep hips level with knees. Avoid crossing legs for long periods. ?Stand to Sit / Sit to Stand ? ? ?To sit: Bend knees to lower self onto front edge of chair, then scoot back on seat. ?To stand: Reverse sequence by placing one foot forward, and scoot to front of seat. Use rocking motion to stand up.  ? ?Work Height and Reach ? ?Ideal work height is no more than 2 to 4 inches below elbow level when standing, and at elbow level when sitting. Reaching should be limited to arm's length, with elbows slightly bent. ? ?Bending ? ?Bend at hips and knees, not back. Keep feet shoulder-width apart. ? ? ? Posture - Standing ? ? ?Good posture is important. Avoid slouching and forward head thrust. Maintain curve in low back and align ears over shoul- ders, hips over ankles. ? ?Alternating Positions ? ? ?Alternate tasks and change positions frequently to reduce fatigue and muscle tension. Take rest breaks. ?Computer Work ? ? ?Position work to face forward. Use proper work and seat height. Keep shoulders back and down, wrists straight, and elbows at right angles. Use chair that provides full back support. Add footrest and lumbar roll as needed. ? ?Getting Into / Out of Car ? ?Lower self onto seat, scoot back, then bring in one leg at a time. Reverse sequence to get out. ? ?Dressing ? ?Lie on back to pull socks or slacks over feet, or sit  and bend leg while keeping back straight. ?  ? ?[place] ? ?Place one foot on ledge of cabinet under sink when standing at sink for prolonged periods. ? ? ?Pushing / Pulling ? ?Pushing is preferable to pulling. Keep back in proper alignment, and use leg muscles to do the work. ? ?Deep Squat ? ? ?Squat and lift with both arms held against upper trunk. Tighten stomach muscles without holding breath. ?Use smooth movements to avoid jerking.  ?Avoid Twisting ? ? ?Avoid twisting or bending back. Pivot around using foot movements, and bend at knees if needed when reaching for articles. ? ?Carrying Luggage ? ? ?Distribute weight evenly on both sides. Use a cart whenever possible. Do not twist trunk. Move body as a unit. ? ? ?Lifting Principles ?Maintain proper posture and head alignment. ?Slide object as close as possible before lifting. ?Move obstacles out of the way. ?Test before lifting; ask for help if too heavy. ?Tighten stomach muscles without holding breath. ?Use smooth movements; do not jerk. ?Use legs to do the work, and pivot with feet. ?Distribute the work load symmetrically and close to the center of trunk. ?Push instead of pull whenever possible.  ? ?Ask For Help ? ? ?Ask for help and delegate to others when possible. Coordinate your movements when lifting together, and maintain the low back curve. ? ?Log Roll ? ? ?Lying on back, bend left knee and place left  arm across chest. Roll all in one movement to the right. Reverse to roll to the left. Always move as one unit. ?[place] ? ?Use long-handled equipment to avoid stooping. ? ? ?[place] ? ?Position yourself as close as possible to reach work surface. Avoid straining your back. ? ?Laundry - Unloading Wash ? ? ?To unload small items at bottom of washer, lift leg opposite to arm being used to reach. ? ?Gardening - Raking ? ?Move close to area to be raked. Use arm movements to do the work. Keep back straight and avoid twisting. ? ?    ?Cart ? ?When reaching into cart with one arm, lift opposite leg to keep back straight. ? ? Getting Into / Out of Bed ? ?Lower self to lie down on one side by raising legs and lowering head at the same time. Use arms to assist moving without twisting. Bend both knees to roll onto back if desired. To sit up, start from lying on side, and use same move-ments in reverse. ?[place] ? ?Hold the vacuum with arm held at side. Step back and forth to move it, keeping head up. Avoid twisting. ? ? ?Laundry - Loading Wash ? ?Position laundry basket so that bending and twisting can be avoided. ? ? Laundry - Unloading Dryer ? ?Squat down to reach into clothes dryer or use a reacher. ? ?[place] / [place] ? ?Squat or Kneel. Knee pads may be helpful. ? ? ? ? ? ? ? ? ? ? ? ? ? ? ? ?  ?  ? ?[name], PT, [place] ?Certified Exercise Expert for the Aging Adult  ?09/21/21 8:51 AM ?Phone: [phone] ?Fax: [phone]  ?

## 2021-09-22 NOTE — Therapy (Signed)
?OUTPATIENT PHYSICAL THERAPY TREATMENT NOTE ? ? ?Patient Name: [name] ?MRN: [id number] ?DOB:Apr 02, 1941, 81 y.o., female ?Today's Date: 09/23/2021 ? ?PCP: Lucianne Lei, MD ?REFERRING PROVIDER: Lucianne Lei, MD ? ? PT End of Session - 09/23/21 1023   ? ? Visit Number 4   ? Number of Visits 12   ? Date for PT Re-Evaluation 10/21/21   ? Authorization Type MCR AARP 10th visit progress note 6th FOTO   ? PT Start Time 1020   ? PT Stop Time 1100   ? PT Time Calculation (min) 40 min   ? Activity Tolerance Patient tolerated treatment well   ? Behavior During Therapy [facility] for tasks assessed/performed   ? ?  ?  ? ?  ? ? ? ? ?Past Medical History:  ?Diagnosis Date  ? Asthma   ? allergic induced  ? Cough   ? Hypertension   ? Rhinitis   ? ?Past Surgical History:  ?Procedure Laterality Date  ? ABDOMINAL HYSTERECTOMY    ? BACK SURGERY    ? CATARACT EXTRACTION    ? MASS EXCISION  01/05/2012  ? Procedure: MINOR EXCISION OF MASS;  Surgeon: Cammie Sickle., MD;  Location: [place];  Service: Orthopedics;  Laterality: Right;  excision mucoid cyst right long finger, debride DIP joint  ? ROTATOR CUFF REPAIR Bilateral   ? TOTAL KNEE ARTHROPLASTY Left   ? twice  ? ?Patient Active Problem List  ? Diagnosis Date Noted  ? Reactive airway disease 07/29/2020  ? Seasonal and perennial allergic rhinitis 07/29/2020  ? Gastroesophageal reflux disease 07/29/2020  ? Unilateral primary osteoarthritis, right knee 11/21/2017  ? Status post revision of total knee, left 11/21/2017  ? Chronic rhinitis 02/25/2016  ? Cough 02/25/2016  ? Hoarseness of voice 02/25/2016  ? Chronic leukopenia 12/15/2015  ? ? ?REFERRING DIAG: pain in neck radiating to both shoulders ? ?THERAPY DIAG:  ?Cervicalgia ? ?Abnormal posture ? ?Muscle weakness (generalized) ? ?Chronic pain of left knee ? ?Chronic pain of right knee ? ?Stiffness of left knee, not elsewhere classified ? ?Stiffness of right knee, not elsewhere classified ? ?Difficulty in walking, not  elsewhere classified ? ?PERTINENT HISTORY: HTN, asthma ? ?PRECAUTIONS: none ? ?SUBJECTIVE  I am doing well about a 1/10.  I dont think I need any needling today.  I am ready to exercise ? ?PAIN:  ?Are you having pain? Are you having pain? Yes: NPRS scale: 1/10 ?Pain location: neck dorso cervical fat pad radiates into scapula bil R>L ?Pain description: aching, sometimes sharp ?Aggravating factors: turning your neck ,driving and looking for blind spots , sleeping , prepping food in the kitchen ?Relieving factors: take motrin ?Pain at rest 4/10 but when turning neck pain is 5/10 ? ? ? ? ?OBJECTIVE:  ?  ?DIAGNOSTIC FINDINGS: 08-30-21 ?IMPRESSION: ?1. C4-5-C6-7 degenerative disc disease with associated uncovertebral ?hypertrophy and facet sclerosis. Facet sclerosis has progressed from ?04/15/2008. ?2. Multilevel bilateral neural foraminal narrowing. ?  ?PATIENT SURVEYS:  ?FOTO 52 % predicted 63% ?  ?  ?COGNITION: ?Overall cognitive status: Within functional limits for tasks assessed ?  ?  ?SENSATION: ?WFL ?  ?POSTURE:  ?Forward Head, dorsocervical fat pad ?  ?PALPATION: ?TTP over        ?  ?CERVICAL ROM:  ?  ?Active ROM A/PROM (deg) ?09/09/2021 AROM ?Post TPDN ?09-16-21 AROM ?09-23-21  ?Flexion 33  35  ?Extension 30 with pain  44  ?Right lateral flexion 15 with pain 20 29  ?Left lateral  flexion 19with pain 20 24  ?Right rotation 40 45 54  ?Left rotation 49 50 59  ? (Blank rows = not tested) ?  ?UE ROM: ?  ?Active ROM Right APROM ?09/09/2021 Left ?09/09/2021  ?Shoulder flexion 142/155 141/151  ?Shoulder extension      ?Shoulder abduction 131 135  ?Shoulder adduction      ?Shoulder extension      ?Shoulder internal rotation 42 53  ?Shoulder external rotation 85 90  ?Elbow flexion      ?Elbow extension      ? (Blank rows = not tested) ?  ?UE MMT: ?  ?MMT Right ?09/09/2021 Left ?09/09/2021  ?Shoulder flexion 4+ 4+  ?Shoulder extension 4+ 4+  ?Shoulder abduction 4 4  ?Shoulder adduction      ?Shoulder extension      ?Shoulder internal  rotation 4 4  ?Shoulder external rotation 4- 4-  ?Middle trapezius      ?Lower trapezius      ?Elbow flexion      ?Elbow extension      ?Wrist supination      ?Grip strength 45.3 43  ? (Blank rows = not tested) ?  ?CERVICAL SPECIAL TESTS:  ?Neck flexor muscle endurance test: Positive, Upper limb tension test (ULTT): Negative, Spurling's test: Negative, and Distraction test: Negative ?Supine flexion endurance test 19 sec  ( normal 35 SEc) ?  09-23-21  26 sec ?FUNCTIONAL TESTS:  ?5 times sit to stand: 34.61 ?Pt with left TKA limited flexion 50 degrees ?  ?PATIENT SURVEYS:  ?FOTO 52 % predicted 63% ?  ?TODAY'S TREATMENT:  ? ?[facility] Adult PT Treatment:                                                DATE: 09-23-21 ?Therapeutic Exercise: ?Supine Deep Neck Flexor Training  5 x 10 sec hold ?Cervical retraction 10 x  ?Supine flexion and rotate 10 x to R and L ?Shoulder Diagonal Horizontal Abduction GTB - 3 x 10 Vc for straight elbows ?Shoulder Horizontal Abduction with GTB  3 x 10  ?Bil Shld extension GTB 3 x 10 ?Posterior capsule stretch ?Bil Shld row GTB  3 x 10 ?Bil ER with GTB 3 x 10 ?OH press with 10 lb bil hands 2 x 10 ?OH press with R and then L with 5 lb 1 x 10 each ?Upper trap stretch R and L 2 x 30 sec stretch ? ? ? ?[facility] Adult PT Treatment:                                                DATE: 09-21-21 ?Therapeutic Exercise: ?Supine Deep Neck Flexor Training  5 x 10 sec hold ?Supine flexion and rotate 10 x to R and L ?Shoulder Diagonal Horizontal Abduction GTB - 3 x 10 Vc for straight elbows ?Shoulder Horizontal Abduction with GTB  3 x 10  ?Seated Assisted Cervical Rotation with Towel  10 reps - 10-15 sec hold ?Manual Therapy: ?STW over bil upper trap and levator ?In prone, R UPA C-3 to C-6,  in supine  PA C-3 to C-7 with gentle overpressure for lateral glides and rotation ?Suboccipital release ?Trigger Point Dry Needling Treatment: ?Pre-treatment instruction: Patient instructed on dry  needling rationale, procedures, and  possible side effects including pain during treatment (achy,cramping feeling), bruising, drop of blood, lightheadedness, nausea, sweating. ?Patient Consent Given: Yes ?Education handout provided: Previously provided ?Muscles treated: bil upper trap and levator  L> R . C-3 to C-6 bil cervical paraspinals ?Needle size and number: .25x76m x 8 ?Electrical stimulation performed: No ?Parameters: N/A ?Treatment response/outcome: Twitch response elicited and Palpable decrease in muscle tension ?Post-treatment instructions: Patient instructed to expect possible mild to moderate muscle soreness later today and/or tomorrow. Patient instructed in methods to reduce muscle soreness and to continue prescribed HEP. If patient was dry needled over the lung field, patient was instructed on signs and symptoms of pneumothorax and, however unlikely, to see immediate medical attention should they occur. Patient was also educated on signs and symptoms of infection and to seek medical attention should they occur. Patient verbalized understanding of these instructions and education.  ? ?Modalities: ?Moist hot pack while educating on posture, body mechanics and ADL /pain manangement helps ?Self Care: ?Pt posture sitting and standing ?ADL with correct posture and back/neck relieving positions. ?Verbally explanation of correct lifting with Handout. ?Pain management strategies with household chores and endurance ? ? ? ? ?  [place]                                                [place] ?Go over FOTO report and  TPDN ?Therapeutic Exercise: ?Supine Deep Neck Flexor Training  10 x 10 sec hold ?Seated Cervical Retraction Protraction AROM   2 x 10 ?Seated Gentle Upper Trapezius Stretch -3 reps - 30 sec  hold ?Gentle Levator Scapulae Stretch  4 reps  R and L - 30  sec hold ? Shoulder Diagonal Horizontal Abduction RTB - 3 x 10 ?Shoulder Horizontal Abduction with RTB  3 x 10  ?Seated Assisted Cervical Rotation with Towel  10  reps - 10-15 sec hold ?Manual Therapy: ?STW over bil upper trap and levator ?In prone, R UPA C-3 to C-6,  in supine  PA C-3 to C-7 with gentle overpressure for lateral glides and rotation ?Suboccipital r

## 2021-09-23 ENCOUNTER — Ambulatory Visit: Payer: Medicare Other | Admitting: Physical Therapy

## 2021-09-23 ENCOUNTER — Encounter: Payer: Self-pay | Admitting: Physical Therapy

## 2021-09-23 DIAGNOSIS — G8929 Other chronic pain: Secondary | ICD-10-CM

## 2021-09-23 DIAGNOSIS — R262 Difficulty in walking, not elsewhere classified: Secondary | ICD-10-CM

## 2021-09-23 DIAGNOSIS — R293 Abnormal posture: Secondary | ICD-10-CM

## 2021-09-23 DIAGNOSIS — M25661 Stiffness of right knee, not elsewhere classified: Secondary | ICD-10-CM

## 2021-09-23 DIAGNOSIS — M25662 Stiffness of left knee, not elsewhere classified: Secondary | ICD-10-CM

## 2021-09-23 DIAGNOSIS — M6281 Muscle weakness (generalized): Secondary | ICD-10-CM

## 2021-09-23 DIAGNOSIS — M542 Cervicalgia: Secondary | ICD-10-CM

## 2021-09-27 NOTE — Therapy (Signed)
?OUTPATIENT PHYSICAL THERAPY TREATMENT NOTE ? ? ?Patient Name: [name] ?MRN: [id number] ?DOB:1941/03/12, 81 y.o., female ?Today's Date: 09/28/2021 ? ?PCP: Lucianne Lei, MD ?REFERRING PROVIDER: Lucianne Lei, MD ? ? PT End of Session - 09/28/21 0854   ? ? Visit Number 5   ? Number of Visits 12   ? Date for PT Re-Evaluation 10/21/21   ? Authorization Type MCR AARP 10th visit progress note 6th FOTO   ? PT Start Time 0848   ? ?  ?  ? ?  ? ? ? ? ? ?Past Medical History:  ?Diagnosis Date  ? Asthma   ? allergic induced  ? Cough   ? Hypertension   ? Rhinitis   ? ?Past Surgical History:  ?Procedure Laterality Date  ? ABDOMINAL HYSTERECTOMY    ? BACK SURGERY    ? CATARACT EXTRACTION    ? MASS EXCISION  01/05/2012  ? Procedure: MINOR EXCISION OF MASS;  Surgeon: Cammie Sickle., MD;  Location: [place];  Service: Orthopedics;  Laterality: Right;  excision mucoid cyst right long finger, debride DIP joint  ? ROTATOR CUFF REPAIR Bilateral   ? TOTAL KNEE ARTHROPLASTY Left   ? twice  ? ?Patient Active Problem List  ? Diagnosis Date Noted  ? Reactive airway disease 07/29/2020  ? Seasonal and perennial allergic rhinitis 07/29/2020  ? Gastroesophageal reflux disease 07/29/2020  ? Unilateral primary osteoarthritis, right knee 11/21/2017  ? Status post revision of total knee, left 11/21/2017  ? Chronic rhinitis 02/25/2016  ? Cough 02/25/2016  ? Hoarseness of voice 02/25/2016  ? Chronic leukopenia 12/15/2015  ? ? ?REFERRING DIAG: pain in neck radiating to both shoulders ? ?THERAPY DIAG:  ?Cervicalgia ? ?Abnormal posture ? ?Muscle weakness (generalized) ? ?Chronic pain of left knee ? ?Chronic pain of right knee ? ?Stiffness of right knee, not elsewhere classified ? ?Stiffness of left knee, not elsewhere classified ? ?Difficulty in walking, not elsewhere classified ? ?PERTINENT HISTORY: HTN, asthma ? ?PRECAUTIONS: none ? ?SUBJECTIVE   ?I am doing well about a 1/10.  My son is down from [place] and I am thinking  about moving to [place] to be with him ? ? ?PAIN:  ?Are you having pain? Are you having pain? Yes: NPRS scale: 1/10 ?Pain location: neck dorso cervical fat pad radiates into scapula bil R>L ?Pain description: aching, sometimes sharp ?Aggravating factors: turning your neck ,driving and looking for blind spots , sleeping , prepping food in the kitchen ?Relieving factors: take motrin ?Pain at rest 4/10 but when turning neck pain is 5/10 ? ? ? ? ?OBJECTIVE:  ?  ?DIAGNOSTIC FINDINGS: 08-30-21 ?IMPRESSION: ?1. C4-5-C6-7 degenerative disc disease with associated uncovertebral ?hypertrophy and facet sclerosis. Facet sclerosis has progressed from ?04/15/2008. ?2. Multilevel bilateral neural foraminal narrowing. ?  ?PATIENT SURVEYS:  ?FOTO 52 % predicted 63% ?  ?  ?COGNITION: ?Overall cognitive status: Within functional limits for tasks assessed ?  ?  ?SENSATION: ?WFL ?  ?POSTURE:  ?Forward Head, dorsocervical fat pad ?  ?PALPATION: ?TTP over        ?  ?CERVICAL ROM:  ?  ?Active ROM A/PROM (deg) ?09/09/2021 AROM ?Post TPDN ?09-16-21 AROM ?09-23-21  ?Flexion 33  35  ?Extension 30 with pain  44  ?Right lateral flexion 15 with pain 20 29  ?Left lateral flexion 19with pain 20 24  ?Right rotation 40 45 54  ?Left rotation 49 50 59  ? (Blank rows = not tested) ?  ?  UE ROM: ?  ?Active ROM Right APROM ?09/09/2021 Left ?09/09/2021  ?Shoulder flexion 142/155 141/151  ?Shoulder extension      ?Shoulder abduction 131 135  ?Shoulder adduction      ?Shoulder extension      ?Shoulder internal rotation 42 53  ?Shoulder external rotation 85 90  ?Elbow flexion      ?Elbow extension      ? (Blank rows = not tested) ?  ?UE MMT: ?  ?MMT Right ?09/09/2021 Left ?09/09/2021   ?Shoulder flexion 4+ 4+   ?Shoulder extension 4+ 4+   ?Shoulder abduction 4 4   ?Shoulder adduction       ?Shoulder extension       ?Shoulder internal rotation 4 4   ?Shoulder external rotation 4- 4-   ?Middle trapezius       ?Lower trapezius       ?Elbow flexion       ?Elbow extension        ?Wrist supination       ?Grip strength 45.3 43   ? (Blank rows = not tested) ? Grip strength 09-28-21  R   58lb    L 51.3 lb Left hand dominant ?CERVICAL SPECIAL TESTS:  ?Neck flexor muscle endurance test: Positive, Upper limb tension test (ULTT): Negative, Spurling's test: Negative, and Distraction test: Negative ?Supine flexion endurance test 19 sec  ( normal 35 SEc) ?  09-23-21  26 sec ?FUNCTIONAL TESTS:  ?5 times sit to stand: 34.61 ?Pt with left TKA limited flexion 50 degrees ?  ?PATIENT SURVEYS:  ?FOTO 52 % predicted 63% ?  ?TODAY'S TREATMENT:  ? ?[facility] Adult PT Treatment:                                                DATE: 09-28-21 ? Grip strength 09-28-21  R   58lb    L 51.3 lb Left hand dominant ?Therapeutic Exercise: ?Nustep 6 min Level 4  ?OH press with 10 # KB 3 x 10 with posterior capsule stretch between sets ?Snatch and press overhead 2 x 10 on R and then L 2 x 10 R and L with flexion/eR Stretch elbow overhead with self overpressure between sets ?Upper trap stretch R and L 2 x 30 sec stretch ?Farmers carry for 50 feet with R 15 # and L 10 # for 126 feet ?Cervical retraction 10 x  ?Shoulder retraction with scapula pull down 10 x ?Bil Shld extension BTB 3 x 10 ?Bil Shld row BTB  3 x 10 ?Bil ER with GTB 3 x 10 with towel under elbow to recruit more fibers ? ? ?[facility] Adult PT Treatment:                                                DATE: 09-23-21 ?Therapeutic Exercise: ?Supine Deep Neck Flexor Training  5 x 10 sec hold ?Cervical retraction 10 x  ?Supine flexion and rotate 10 x to R and L ?Shoulder Diagonal Horizontal Abduction GTB - 3 x 10 Vc for straight elbows ?Shoulder Horizontal Abduction with GTB  3 x 10  ?Bil Shld extension GTB 3 x 10 ?Posterior capsule stretch ?Bil Shld row GTB  3 x 10 ?Bil ER with GTB  3 x 10 ?OH press with 10 lb bil hands 2 x 10 ?OH press with R and then L with 5 lb 1 x 10 each ?Upper trap stretch R and L 2 x 30 sec stretch ? ? ? ?[facility] Adult PT Treatment:                                                 DATE: 09-21-21 ?Therapeutic Exercise: ?Supine Deep Neck Flexor Training  5 x 10 sec hold ?Supine flexion and rotate 10 x to R and L ?Shoulder Diagonal Horizontal Abduction GTB - 3 x 10 Vc for straight elbows ?Shoulder Horizontal Abduction with GTB  3 x 10  ?Seated Assisted Cervical Rotation with Towel  10 reps - 10-15 sec hold ?Manual Therapy: ?STW over bil upper trap and levator ?In prone, R UPA C-3 to C-6,  in supine  PA C-3 to C-7 with gentle overpressure for lateral glides and rotation ?Suboccipital release ?Trigger Point Dry Needling Treatment: ?Pre-treatment instruction: Patient instructed on dry needling rationale, procedures, and possible side effects including pain during treatment (achy,cramping feeling), bruising, drop of blood, lightheadedness, nausea, sweating. ?Patient Consent Given: Yes ?Education handout provided: Previously provided ?Muscles treated: bil upper trap and levator  L> R . C-3 to C-6 bil cervical paraspinals ?Needle size and number: .25x83m x 8 ?Electrical stimulation performed: No ?Parameters: N/A ?Treatment response/outcome: Twitch response elicited and Palpable decrease in muscle tension ?Post-treatment instructions: Patient instructed to expect possible mild to moderate muscle soreness later today and/or tomorrow. Patient instructed in methods to reduce muscle soreness and to continue prescribed HEP. If patient was dry needled over the lung field, patient was instructed on signs and symptoms of pneumothorax and, however unlikely, to see immediate medical attention should they occur. Patient was also educated on signs and symptoms of infection and to seek medical attention should they occur. Patient verbalized understanding of these instructions and education.  ? ?Modalities: ?Moist hot pack while educating on posture, body mechanics and ADL /pain manangement helps ?Self Care: ?Pt posture sitting and standing ?ADL with correct posture and back/neck relieving  positions. ?Verbally explanation of correct lifting with Handout. ?Pain management strategies with household chores and endurance ? ? ? ? ?  ?[facility]

## 2021-09-28 ENCOUNTER — Ambulatory Visit: Payer: Medicare Other | Admitting: Physical Therapy

## 2021-09-28 DIAGNOSIS — G8929 Other chronic pain: Secondary | ICD-10-CM

## 2021-09-28 DIAGNOSIS — M25562 Pain in left knee: Secondary | ICD-10-CM | POA: Diagnosis not present

## 2021-09-28 DIAGNOSIS — R293 Abnormal posture: Secondary | ICD-10-CM

## 2021-09-28 DIAGNOSIS — M6281 Muscle weakness (generalized): Secondary | ICD-10-CM | POA: Diagnosis not present

## 2021-09-28 DIAGNOSIS — M25662 Stiffness of left knee, not elsewhere classified: Secondary | ICD-10-CM

## 2021-09-28 DIAGNOSIS — M25661 Stiffness of right knee, not elsewhere classified: Secondary | ICD-10-CM

## 2021-09-28 DIAGNOSIS — R262 Difficulty in walking, not elsewhere classified: Secondary | ICD-10-CM

## 2021-09-28 DIAGNOSIS — M25561 Pain in right knee: Secondary | ICD-10-CM | POA: Diagnosis not present

## 2021-09-28 DIAGNOSIS — M542 Cervicalgia: Secondary | ICD-10-CM

## 2021-09-30 ENCOUNTER — Ambulatory Visit: Payer: Medicare Other | Admitting: Physical Therapy

## 2021-10-04 NOTE — Therapy (Signed)
?OUTPATIENT PHYSICAL THERAPY TREATMENT NOTE ? ? ?Patient Name: [name] ?MRN: [id number] ?DOB:1940-10-20, 81 y.o., female ?Today's Date: 10/05/2021 ? ?PCP: Lucianne Lei, MD ?REFERRING PROVIDER: Lucianne Lei, MD ? ? PT End of Session - 10/05/21 0845   ? ? Visit Number 6   ? Number of Visits 12   ? Date for PT Re-Evaluation 10/21/21   ? Authorization Type MCR AARP 10th visit progress note 6th FOTO   ? PT Start Time [phone]   ? PT Stop Time 9798   ? PT Time Calculation (min) 46 min   ? Activity Tolerance Patient tolerated treatment well   ? Behavior During Therapy [facility] for tasks assessed/performed   ? ?  ?  ? ?  ? ? ? ? ? ? ?Past Medical History:  ?Diagnosis Date  ? Asthma   ? allergic induced  ? Cough   ? Hypertension   ? Rhinitis   ? ?Past Surgical History:  ?Procedure Laterality Date  ? ABDOMINAL HYSTERECTOMY    ? BACK SURGERY    ? CATARACT EXTRACTION    ? MASS EXCISION  01/05/2012  ? Procedure: MINOR EXCISION OF MASS;  Surgeon: Cammie Sickle., MD;  Location: [place];  Service: Orthopedics;  Laterality: Right;  excision mucoid cyst right long finger, debride DIP joint  ? ROTATOR CUFF REPAIR Bilateral   ? TOTAL KNEE ARTHROPLASTY Left   ? twice  ? ?Patient Active Problem List  ? Diagnosis Date Noted  ? Reactive airway disease 07/29/2020  ? Seasonal and perennial allergic rhinitis 07/29/2020  ? Gastroesophageal reflux disease 07/29/2020  ? Unilateral primary osteoarthritis, right knee 11/21/2017  ? Status post revision of total knee, left 11/21/2017  ? Chronic rhinitis 02/25/2016  ? Cough 02/25/2016  ? Hoarseness of voice 02/25/2016  ? Chronic leukopenia 12/15/2015  ? ? ?REFERRING DIAG: pain in neck radiating to both shoulders ? ?THERAPY DIAG:  ?Cervicalgia ? ?Abnormal posture ? ?Muscle weakness (generalized) ? ?Chronic pain of left knee ? ?Chronic pain of right knee ? ?Stiffness of right knee, not elsewhere classified ? ?Stiffness of left knee, not elsewhere classified ? ?Difficulty in walking,  not elsewhere classified ? ?PERTINENT HISTORY: HTN, asthma ? ?PRECAUTIONS: none ? ?SUBJECTIVE   ?I am doing well.  I barely even notice a pain.  I hope I could continue at a community wellness place but my doctors do not want me to be out because of my immunity issues. ? ? ?PAIN:  ?Are you having pain? Are you having pain? Yes: NPRS scale: 1/10 or a 1/2 or 1  ?Pain location: neck dorso cervical fat pad radiates into scapula bil R>L ?Pain description: aching, sometimes sharp ?Aggravating factors: turning your neck ,driving and looking for blind spots , sleeping , prepping food in the kitchen ?Relieving factors: take motrin ?Pain at rest 4/10 but when turning neck pain is 5/10 ? ? ? ? ?OBJECTIVE:  ?  ?DIAGNOSTIC FINDINGS: 08-30-21 ?IMPRESSION: ?1. C4-5-C6-7 degenerative disc disease with associated uncovertebral ?hypertrophy and facet sclerosis. Facet sclerosis has progressed from ?04/15/2008. ?2. Multilevel bilateral neural foraminal narrowing. ?  ?PATIENT SURVEYS:  ?FOTO 52 % predicted 63% ?10-05-21 64% ?  ?  ?COGNITION: ?Overall cognitive status: Within functional limits for tasks assessed ?  ?  ?SENSATION: ?WFL ?  ?POSTURE:  ?Forward Head, dorsocervical fat pad ?  ?PALPATION: ?TTP over        ?  ?CERVICAL ROM:  ?  ?Active ROM A/PROM (deg) ?09/09/2021 AROM ?Post TPDN ?  09-16-21 AROM ?09-23-21  ?Flexion 33  35  ?Extension 30 with pain  44  ?Right lateral flexion 15 with pain 20 29  ?Left lateral flexion 19with pain 20 24  ?Right rotation 40 45 54  ?Left rotation 49 50 59  ? (Blank rows = not tested) ?  ?UE ROM: ?  ?Active ROM Right APROM ?09/09/2021 Left ?09/09/2021  ?Shoulder flexion 142/155 141/151  ?Shoulder extension      ?Shoulder abduction 131 135  ?Shoulder adduction      ?Shoulder extension      ?Shoulder internal rotation 42 53  ?Shoulder external rotation 85 90  ?Elbow flexion      ?Elbow extension      ? (Blank rows = not tested) ?  ?UE MMT: ?  ?MMT Right ?09/09/2021 Left ?09/09/2021   ?Shoulder flexion 4+ 4+    ?Shoulder extension 4+ 4+   ?Shoulder abduction 4 4   ?Shoulder adduction       ?Shoulder extension       ?Shoulder internal rotation 4 4   ?Shoulder external rotation 4- 4-   ?Middle trapezius       ?Lower trapezius       ?Elbow flexion       ?Elbow extension       ?Wrist supination       ?Grip strength 45.3 43   ? (Blank rows = not tested) ? Grip strength 09-28-21  R   58lb    L 51.3 lb Left hand dominant ?CERVICAL SPECIAL TESTS:  ?Neck flexor muscle endurance test: Positive, Upper limb tension test (ULTT): Negative, Spurling's test: Negative, and Distraction test: Negative ?Supine flexion endurance test 19 sec  ( normal 35 SEc) ?  09-23-21  26 sec ?FUNCTIONAL TESTS:  ?5 times sit to stand: 34.61 ?Pt with left TKA limited flexion 50 degrees ?  ?PATIENT SURVEYS:  ?FOTO 52 % predicted 63% ?  ?TODAY'S TREATMENT:  ?[facility] Adult PT Treatment:                                                DATE: 10-05-21 ? ?Therapeutic Exercise: ?20# DB over head x 5 without ability to straighten arms ?15# DB overhead x 5,rest and then 10 x  ?Sit to stand 1 x 10 at counter and into chair. VC and TC ? ?Therapeutic Activity: ?Floor to chair  and chair to floor transfers techniques with options for limited left knee flexion using steps/cushions and quadriped and lunge /1/2 kneeling to help pt independently lift body to and from floor.   ? ?Self Care: ?[employer], senior exercise videos for home use to progress as needed  ? ?[facility] Adult PT Treatment:                                                DATE: 09-28-21 ? Grip strength 09-28-21  R   58lb    L 51.3 lb Left hand dominant ?Therapeutic Exercise: ?Nustep 6 min Level 4  ?OH press with 10 # KB 3 x 10 with posterior capsule stretch between sets ?Snatch and press overhead 2 x 10 on R and then L 2 x 10 R and L with flexion/eR Stretch elbow overhead with self overpressure  between sets ?Upper trap stretch R and L 2 x 30 sec stretch ?Farmers carry for 50 feet with R 15 # and L 10 # for 126  feet ?Cervical retraction 10 x  ?Shoulder retraction with scapula pull down 10 x ?Bil Shld extension BTB 3 x 10 ?Bil Shld row BTB  3 x 10 ?Bil ER with GTB 3 x 10 with towel under elbow to recruit more fibers ? ? ?[facility] Adult PT Treatment:                                                DATE: 09-23-21 ?Therapeutic Exercise: ?Supine Deep Neck Flexor Training  5 x 10 sec hold ?Cervical retraction 10 x  ?Supine flexion and rotate 10 x to R and L ?Shoulder Diagonal Horizontal Abduction GTB - 3 x 10 Vc for straight elbows ?Shoulder Horizontal Abduction with GTB  3 x 10  ?Bil Shld extension GTB 3 x 10 ?Posterior capsule stretch ?Bil Shld row GTB  3 x 10 ?Bil ER with GTB 3 x 10 ?OH press with 10 lb bil hands 2 x 10 ?OH press with R and then L with 5 lb 1 x 10 each ?Upper trap stretch R and L 2 x 30 sec stretch ? ? ? ?[facility] Adult PT Treatment:                                                DATE: 09-21-21 ?Therapeutic Exercise: ?Supine Deep Neck Flexor Training  5 x 10 sec hold ?Supine flexion and rotate 10 x to R and L ?Shoulder Diagonal Horizontal Abduction GTB - 3 x 10 Vc for straight elbows ?Shoulder Horizontal Abduction with GTB  3 x 10  ?Seated Assisted Cervical Rotation with Towel  10 reps - 10-15 sec hold ?Manual Therapy: ?STW over bil upper trap and levator ?In prone, R UPA C-3 to C-6,  in supine  PA C-3 to C-7 with gentle overpressure for lateral glides and rotation ?Suboccipital release ?Trigger Point Dry Needling Treatment: ?Pre-treatment instruction: Patient instructed on dry needling rationale, procedures, and possible side effects including pain during treatment (achy,cramping feeling), bruising, drop of blood, lightheadedness, nausea, sweating. ?Patient Consent Given: Yes ?Education handout provided: Previously provided ?Muscles treated: bil upper trap and levator  L> R . C-3 to C-6 bil cervical paraspinals ?Needle size and number: .25x74m x 8 ?Electrical stimulation performed: No ?Parameters: N/A ?Treatment  response/outcome: Twitch response elicited and Palpable decrease in muscle tension ?Post-treatment instructions: Patient instructed to expect possible mild to moderate muscle soreness later today and/or tomorrow. [name]

## 2021-10-05 ENCOUNTER — Encounter: Payer: Self-pay | Admitting: Physical Therapy

## 2021-10-05 ENCOUNTER — Ambulatory Visit: Payer: Medicare Other | Admitting: Physical Therapy

## 2021-10-05 DIAGNOSIS — R293 Abnormal posture: Secondary | ICD-10-CM

## 2021-10-05 DIAGNOSIS — M542 Cervicalgia: Secondary | ICD-10-CM | POA: Diagnosis not present

## 2021-10-05 DIAGNOSIS — M25662 Stiffness of left knee, not elsewhere classified: Secondary | ICD-10-CM

## 2021-10-05 DIAGNOSIS — M25661 Stiffness of right knee, not elsewhere classified: Secondary | ICD-10-CM

## 2021-10-05 DIAGNOSIS — M6281 Muscle weakness (generalized): Secondary | ICD-10-CM

## 2021-10-05 DIAGNOSIS — M25562 Pain in left knee: Secondary | ICD-10-CM | POA: Diagnosis not present

## 2021-10-05 DIAGNOSIS — M25561 Pain in right knee: Secondary | ICD-10-CM | POA: Diagnosis not present

## 2021-10-05 DIAGNOSIS — G8929 Other chronic pain: Secondary | ICD-10-CM

## 2021-10-05 DIAGNOSIS — R262 Difficulty in walking, not elsewhere classified: Secondary | ICD-10-CM

## 2021-10-05 NOTE — Patient Instructions (Signed)
? ?  Voncille Lo, PT, ATRIC ?Certified Exercise Expert for the Aging Adult  ?10/05/21 9:32 AM ?Phone: [phone] ?Fax: [phone]  ?

## 2021-10-07 ENCOUNTER — Ambulatory Visit: Payer: Medicare Other | Admitting: Physical Therapy

## 2021-10-07 ENCOUNTER — Encounter: Payer: Self-pay | Admitting: Physical Therapy

## 2021-10-07 DIAGNOSIS — M542 Cervicalgia: Secondary | ICD-10-CM | POA: Diagnosis not present

## 2021-10-07 DIAGNOSIS — G8929 Other chronic pain: Secondary | ICD-10-CM | POA: Diagnosis not present

## 2021-10-07 DIAGNOSIS — R293 Abnormal posture: Secondary | ICD-10-CM | POA: Diagnosis not present

## 2021-10-07 DIAGNOSIS — M25562 Pain in left knee: Secondary | ICD-10-CM | POA: Diagnosis not present

## 2021-10-07 DIAGNOSIS — M6281 Muscle weakness (generalized): Secondary | ICD-10-CM | POA: Diagnosis not present

## 2021-10-07 DIAGNOSIS — M25561 Pain in right knee: Secondary | ICD-10-CM | POA: Diagnosis not present

## 2021-10-07 NOTE — Therapy (Signed)
?OUTPATIENT PHYSICAL THERAPY TREATMENT NOTE ? ? ?Patient Name: [name] ?MRN: [id number] ?DOB:13-Oct-1940, 81 y.o., female ?Today's Date: 10/07/2021 ? ?PCP: Lucianne Lei, MD ?REFERRING PROVIDER: Lucianne Lei, MD ? ? PT End of Session - 10/07/21 1015   ? ? Visit Number 7   ? Number of Visits 12   ? Date for PT Re-Evaluation 10/21/21   ? Authorization Type MCR AARP 10th visit progress note 6th FOTO   ? PT Start Time [phone]   ? PT Stop Time 1015   ? PT Time Calculation (min) 39 min   ? Activity Tolerance Patient tolerated treatment well   ? Behavior During Therapy [facility] for tasks assessed/performed   ? ?  ?  ? ?  ? ? ? ? ? ? ? ?Past Medical History:  ?Diagnosis Date  ? Asthma   ? allergic induced  ? Cough   ? Hypertension   ? Rhinitis   ? ?Past Surgical History:  ?Procedure Laterality Date  ? ABDOMINAL HYSTERECTOMY    ? BACK SURGERY    ? CATARACT EXTRACTION    ? MASS EXCISION  01/05/2012  ? Procedure: MINOR EXCISION OF MASS;  Surgeon: Cammie Sickle., MD;  Location: [place];  Service: Orthopedics;  Laterality: Right;  excision mucoid cyst right long finger, debride DIP joint  ? ROTATOR CUFF REPAIR Bilateral   ? TOTAL KNEE ARTHROPLASTY Left   ? twice  ? ?Patient Active Problem List  ? Diagnosis Date Noted  ? Reactive airway disease 07/29/2020  ? Seasonal and perennial allergic rhinitis 07/29/2020  ? Gastroesophageal reflux disease 07/29/2020  ? Unilateral primary osteoarthritis, right knee 11/21/2017  ? Status post revision of total knee, left 11/21/2017  ? Chronic rhinitis 02/25/2016  ? Cough 02/25/2016  ? Hoarseness of voice 02/25/2016  ? Chronic leukopenia 12/15/2015  ? ? ?REFERRING DIAG: pain in neck radiating to both shoulders ? ?THERAPY DIAG:  ?Cervicalgia ? ?Abnormal posture ? ?Muscle weakness (generalized) ? ?PERTINENT HISTORY: HTN, asthma ? ?PRECAUTIONS: none ? ?SUBJECTIVE   ?I am a 1/2 or 1/10. My friend said she wouldn't want to work as hard as I do. ? ? ?PAIN:  ?Are you having pain? Are  you having pain? Yes: NPRS scale: 1/2 of 1/10 ?Pain location: neck dorso cervical fat pad radiates into scapula bil R>L ?Pain description: aching, sometimes sharp ?Aggravating factors: turning your neck ,driving and looking for blind spots , sleeping , prepping food in the kitchen ?Relieving factors: take motrin ?Pain at rest 4/10 but when turning neck pain is 5/10 ? ? ? ? ?OBJECTIVE:  ?  ?DIAGNOSTIC FINDINGS: 08-30-21 ?IMPRESSION: ?1. C4-5-C6-7 degenerative disc disease with associated uncovertebral ?hypertrophy and facet sclerosis. Facet sclerosis has progressed from ?04/15/2008. ?2. Multilevel bilateral neural foraminal narrowing. ?  ?PATIENT SURVEYS:  ?FOTO 52 % predicted 63% ?10-05-21 64% ?  ?  ?COGNITION: ?Overall cognitive status: Within functional limits for tasks assessed ?  ?  ?SENSATION: ?WFL ?  ?POSTURE:  ?Forward Head, dorsocervical fat pad ?  ?PALPATION: ?TTP over        ?  ?CERVICAL ROM:  ?  ?Active ROM A/PROM (deg) ?09/09/2021 AROM ?Post TPDN ?09-16-21 AROM ?09-23-21  ?Flexion 33  35  ?Extension 30 with pain  44  ?Right lateral flexion 15 with pain 20 29  ?Left lateral flexion 19with pain 20 24  ?Right rotation 40 45 54  ?Left rotation 49 50 59  ? (Blank rows = not tested) ?  ?UE ROM: ?  ?  Active ROM Right APROM ?09/09/2021 Left ?09/09/2021  ?Shoulder flexion 142/155 141/151  ?Shoulder extension      ?Shoulder abduction 131 135  ?Shoulder adduction      ?Shoulder extension      ?Shoulder internal rotation 42 53  ?Shoulder external rotation 85 90  ?Elbow flexion      ?Elbow extension      ? (Blank rows = not tested) ?  ?UE MMT: ?  ?MMT Right ?09/09/2021 Left ?09/09/2021   ?Shoulder flexion 4+ 4+   ?Shoulder extension 4+ 4+   ?Shoulder abduction 4 4   ?Shoulder adduction       ?Shoulder extension       ?Shoulder internal rotation 4 4   ?Shoulder external rotation 4- 4-   ?Middle trapezius       ?Lower trapezius       ?Elbow flexion       ?Elbow extension       ?Wrist supination       ?Grip strength 45.3 43   ?  (Blank rows = not tested) ? Grip strength 09-28-21  R   58lb    L 51.3 lb Left hand dominant ?CERVICAL SPECIAL TESTS:  ?Neck flexor muscle endurance test: Positive, Upper limb tension test (ULTT): Negative, Spurling's test: Negative, and Distraction test: Negative ?Supine flexion endurance test 19 sec  ( normal 35 SEc) ?  09-23-21  26 sec ?FUNCTIONAL TESTS:  ?5 times sit to stand: 34.61 ?Pt with left TKA limited flexion 50 degrees ?  ?PATIENT SURVEYS:  ?FOTO 52 % predicted 63% ?  ?TODAY'S TREATMENT:  ?[facility] Adult PT Treatment:                                                DATE: 10-07-21 ?Therapeutic Exercise: ?Hip hinge with dowel rod and performing box squat to insure proper form and self monitoring ?Lat pull with red power band on weight rack above head 2 x 10 R and L ?Sit to stand x 10 with UE support ascent and descent then with 10 lb in R hadn 1 x 10 x 10 ?Star pattern BTB 2 x 10 ? ?Therapeutic Activity: ?Lifting principles and demo with box and added wt, 5#, 15 #, 20# and  25 #  and 30# max about 5 x each with VC and TC ?Farmer's carry for 175 feet to steps up and down 4 steps one step at a time due to rigid L TKA at 50 degrees bony block and then additional 175 ft ? ? ?[facility] Adult PT Treatment:                                                DATE: 10-05-21 ? ?Therapeutic Exercise: ?20# DB over head x 5 without ability to straighten arms ?15# DB overhead x 5,rest and then 10 x  ?Sit to stand 1 x 10 at counter and into chair. VC and TC ? ?Therapeutic Activity: ?Floor to chair  and chair to floor transfers techniques with options for limited left knee flexion using steps/cushions and quadriped and lunge /1/2 kneeling to help pt independently lift body to and from floor.   ? ?Self Care: ?[employer], senior exercise videos for home use  to progress as needed  ? ?[facility] Adult PT Treatment:                                                DATE: 09-28-21 ? Grip strength 09-28-21  R   58lb    L 51.3 lb Left hand  dominant ?Therapeutic Exercise: ?Nustep 6 min Level 4  ?OH press with 10 # KB 3 x 10 with posterior capsule stretch between sets ?Snatch and press overhead 2 x 10 on R and then L 2 x 10 R and L with flexion/eR Stretch elbow overhead with self overpressure between sets ?Upper trap stretch R and L 2 x 30 sec stretch ?Farmers carry for 50 feet with R 15 # and L 10 # for 126 feet ?Cervical retraction 10 x  ?Shoulder retraction with scapula pull down 10 x ?Bil Shld extension BTB 3 x 10 ?Bil Shld row BTB  3 x 10 ?Bil ER with GTB 3 x 10 with towel under elbow to recruit more fibers ? ? ?[place] Adult PT Treatment:                                                DATE: 09-23-21 ?Therapeutic Exercise: ?Supine Deep Neck Flexor Training  5 x 10 sec hold ?Cervical retraction 10 x  ?Supine flexion and rotate 10 x to R and L ?Shoulder Diagonal Horizontal Abduction GTB - 3 x 10 Vc for straight elbows ?Shoulder Horizontal Abduction with GTB  3 x 10  ?Bil Shld extension GTB 3 x 10 ?Posterior capsule stretch ?Bil Shld row GTB  3 x 10 ?Bil ER with GTB 3 x 10 ?OH press with 10 lb bil hands 2 x 10 ?OH press with R and then L with 5 lb 1 x 10 each ?Upper trap stretch R and L 2 x 30 sec stretch ? ? ? ?[facility] Adult PT Treatment:                                                DATE: 09-21-21 ?Therapeutic Exercise: ?Supine Deep Neck Flexor Training  5 x 10 sec hold ?Supine flexion and rotate 10 x to R and L ?Shoulder Diagonal Horizontal Abduction GTB - 3 x 10 Vc for straight elbows ?Shoulder Horizontal Abduction with GTB  3 x 10  ?Seated Assisted Cervical Rotation with Towel  10 reps - 10-15 sec hold ?Manual Therapy: ?STW over bil upper trap and levator ?In prone, R UPA C-3 to C-6,  in supine  PA C-3 to C-7 with gentle overpressure for lateral glides and rotation ?Suboccipital release ?Trigger Point Dry Needling Treatment: ?Pre-treatment instruction: Patient instructed on dry needling rationale, procedures, and possible side effects including pain  during treatment (achy,cramping feeling), bruising, drop of blood, lightheadedness, nausea, sweating. ?Patient Consent Given: Yes ?Education handout provided: Previously provided ?Muscles treated: bil upper trap and l

## 2021-10-12 ENCOUNTER — Encounter: Payer: Medicare Other | Admitting: Physical Therapy

## 2021-10-12 NOTE — Therapy (Signed)
?OUTPATIENT PHYSICAL THERAPY TREATMENT NOTE ? ? ?Patient Name: [name] ?MRN: [id number] ?DOB:1940-12-01, 82 y.o., female ?Today's Date: 10/14/2021 ? ?PCP: Lucianne Lei, MD ?REFERRING PROVIDER: Lucianne Lei, MD ? ? PT End of Session - 10/14/21 0806   ? ? Visit Number 8   ? Number of Visits 12   ? Date for PT Re-Evaluation 10/21/21   ? Authorization Type MCR AARP 10th visit progress note 6th FOTO   ? PT Start Time 0802   ? PT Stop Time 0844   ? PT Time Calculation (min) 42 min   ? Activity Tolerance Patient tolerated treatment well   ? Behavior During Therapy [facility] for tasks assessed/performed   ? ?  ?  ? ?  ? ? ? ? ? ? ? ? ?Past Medical History:  ?Diagnosis Date  ? Asthma   ? allergic induced  ? Cough   ? Hypertension   ? Rhinitis   ? ?Past Surgical History:  ?Procedure Laterality Date  ? ABDOMINAL HYSTERECTOMY    ? BACK SURGERY    ? CATARACT EXTRACTION    ? MASS EXCISION  01/05/2012  ? Procedure: MINOR EXCISION OF MASS;  Surgeon: Cammie Sickle., MD;  Location: [place];  Service: Orthopedics;  Laterality: Right;  excision mucoid cyst right long finger, debride DIP joint  ? ROTATOR CUFF REPAIR Bilateral   ? TOTAL KNEE ARTHROPLASTY Left   ? twice  ? ?Patient Active Problem List  ? Diagnosis Date Noted  ? Reactive airway disease 07/29/2020  ? Seasonal and perennial allergic rhinitis 07/29/2020  ? Gastroesophageal reflux disease 07/29/2020  ? Unilateral primary osteoarthritis, right knee 11/21/2017  ? Status post revision of total knee, left 11/21/2017  ? Chronic rhinitis 02/25/2016  ? Cough 02/25/2016  ? Hoarseness of voice 02/25/2016  ? Chronic leukopenia 12/15/2015  ? ? ?REFERRING DIAG: pain in neck radiating to both shoulders ? ?THERAPY DIAG:  ?Cervicalgia ? ?Abnormal posture ? ?Muscle weakness (generalized) ? ?Chronic pain of left knee ? ?Chronic pain of right knee ? ?Stiffness of right knee, not elsewhere classified ? ?Stiffness of left knee, not elsewhere classified ? ?PERTINENT HISTORY:  HTN, asthma ? ?PRECAUTIONS: none ? ?SUBJECTIVE  This weekend will rain but if not I am going to be in my flower garden.  I have vincas and petunias. ?I am a 1/2 out of /10.  ? ? ?PAIN:  ?Are you having pain? Are you having pain? Yes: NPRS scale: 1/2 of 1/10 ?Pain location: neck dorso cervical fat pad radiates into scapula bil R>L ?Pain description: aching, sometimes sharp ?Aggravating factors: turning your neck ,driving and looking for blind spots , sleeping , prepping food in the kitchen ?Relieving factors: take motrin ?Pain at rest 4/10 but when turning neck pain is 5/10 ? ? ? ? ?OBJECTIVE:  ?  ?DIAGNOSTIC FINDINGS: 08-30-21 ?IMPRESSION: ?1. C4-5-C6-7 degenerative disc disease with associated uncovertebral ?hypertrophy and facet sclerosis. Facet sclerosis has progressed from ?04/15/2008. ?2. Multilevel bilateral neural foraminal narrowing. ?  ?PATIENT SURVEYS:  ?FOTO 52 % predicted 63% ?10-05-21 64% ?  ?  ?COGNITION: ?Overall cognitive status: Within functional limits for tasks assessed ?  ?  ?SENSATION: ?WFL ?  ?POSTURE:  ?Forward Head, dorsocervical fat pad ?  ?PALPATION: ?TTP over        ?  ?CERVICAL ROM:  ?  ?Active ROM A/PROM (deg) ?09/09/2021 AROM ?Post TPDN ?09-16-21 AROM ?09-23-21  ?Flexion 33  35  ?Extension 30 with pain  44  ?Right  lateral flexion 15 with pain 20 29  ?Left lateral flexion 19with pain 20 24  ?Right rotation 40 45 54  ?Left rotation 49 50 59  ? (Blank rows = not tested) ?  ?UE ROM: ?  ?Active ROM Right APROM ?09/09/2021 Left ?09/09/2021  ?Shoulder flexion 142/155 141/151  ?Shoulder extension      ?Shoulder abduction 131 135  ?Shoulder adduction      ?Shoulder extension      ?Shoulder internal rotation 42 53  ?Shoulder external rotation 85 90  ?Elbow flexion      ?Elbow extension      ? (Blank rows = not tested) ?  ?UE MMT: ?  ?MMT Right ?09/09/2021 Left ?09/09/2021 Right ?10-14-21 LEFT ?10-14-21  ?Shoulder flexion 4+ 4+ 5 5  ?Shoulder extension 4+ 4+ 5 5  ?Shoulder abduction '4 4 5 5  '$ ?Shoulder  adduction        ?Shoulder extension        ?Shoulder internal rotation '4 4 5 5  '$ ?Shoulder external rotation 4- 4- 5 5  ?Middle trapezius        ?Lower trapezius        ?Elbow flexion        ?Elbow extension        ?Wrist supination        ?Grip strength 45.3 43 55 48  ? (Blank rows = not tested) ? Grip strength 09-28-21  R   58lb    L 51.3 lb Left hand dominant ?CERVICAL SPECIAL TESTS:  ?Neck flexor muscle endurance test: Positive, Upper limb tension test (ULTT): Negative, Spurling's test: Negative, and Distraction test: Negative ?Supine flexion endurance test 19 sec  ( normal 35 SEc) ? 09-23-21  26 sec ?10-14-21 Supine flexion endurance test 40 sec  ( normal 35 SEc) ?FUNCTIONAL TESTS:  ?5 times sit to stand: 34.61 ?Pt with left TKA limited flexion 50 degrees ?10-14-21 5 x STS 21.65 sec Pt with left TKA limited flexion 50 degrees with elevated seat with there ex pad ?  ?PATIENT SURVEYS:  ?FOTO 52 % predicted 63% ?  ?TODAY'S TREATMENT:  ? ?[place] Adult PT Treatment:                                                DATE: 10-14-21 ?10-14-21 5 x STS 21.65 sec Pt with left TKA limited flexion 50 degrees with elevated seat with There ex pad ?10-14-21 Supine flexion endurance test 40 sec  ( normal 35 SEc) ?Therapeutic Exercise: ?Nu step level 4 5 min with UE/LE  RPE 4/5 ?OH  press bil UE with 15 # 1 x 10 20# 1 x 5 ?Upper  trap stretch and levator stretch after OH press set ?5 lb scaption bil 2 x 10 ?Star pattern BTB 2 x 10 ?Stand to sit with 10 lb  with UE support ascent and descent   2 x 10  ?Farmer's carry with 25 # KB for 200 feet ?Up and down steps with step to due to restricted knee motion on LLE but carrying 25 # KB and UE support ?Modalities- cervical hot pack ?Manual Therapy: ?STW over R upper trap and levator ?in supine  PA C-3 to C-7 with gentle overpressure for lateral glides and rotation ?Suboccipital release ?  Trigger Point Dry Needling Treatment: ?Pre-treatment instruction: Patient instructed on dry needling rationale,  procedures, and  possible side effects including pain during treatment (achy,cramping feeling), bruising, drop of blood, lightheadedness, nausea, sweating. ?Patient Consent Given: Yes ?Education handout provided: Previously provided ?Muscles treated: R upper trap and levator  ?Needle size and number: .30x72m x 4 ?Electrical stimulation performed: No ?Parameters: N/A ?Treatment response/outcome: Twitch response elicited and Palpable decrease in muscle tension ?Post-treatment instructions: Patient instructed to expect possible mild to moderate muscle soreness later today and/or tomorrow. Patient instructed in methods to reduce muscle soreness and to continue prescribed HEP. If patient was dry needled over the lung field, patient was instructed on signs and symptoms of pneumothorax and, however unlikely, to see immediate medical attention should they occur. Patient was also educated on signs and symptoms of infection and to seek medical attention should they occur. Patient verbalized understanding of these instructions and education.  [facility]                                                [facility] ?Therapeutic Exercise: ?Hip hinge with dowel rod and performing box squat to insure proper form and self monitoring ?Lat pull with red power band on weight rack above head 2 x 10 R and L ?Sit to stand x 10 with UE support ascent and descent then with 10 lb in R hadn 1 x 10 x 10 ?Star pattern BTB 2 x 10 ? ?Therapeutic Activity: ?Lifting principles and demo with box and added wt, 5#, 15 #, 20# and  25 #  and 30# max about 5 x each with VC and TC ?Farmer's carry for 175 feet to steps up and down 4 steps one step at a time due to rigid L TKA at 50 degrees bony block and then additional 175 ft ? ? [facility]                                                [facility]: 10[facility]21 ? ?Therapeutic Exercise: ?20# DB over head x 5 without ability to straighten arms ?15# DB overhead x 5,rest and then 10 x  ?Sit  to stand 1 x 10 at counter and into chair. VC and TC ? ?Therapeutic Activity: ?Floor to chair  and chair to floor transfers techniques with options for limited left knee flexion using steps/cushions and q

## 2021-10-14 ENCOUNTER — Encounter: Payer: Self-pay | Admitting: Physical Therapy

## 2021-10-14 ENCOUNTER — Ambulatory Visit: Payer: Medicare Other | Admitting: Physical Therapy

## 2021-10-14 DIAGNOSIS — M542 Cervicalgia: Secondary | ICD-10-CM

## 2021-10-14 DIAGNOSIS — G8929 Other chronic pain: Secondary | ICD-10-CM

## 2021-10-14 DIAGNOSIS — M6281 Muscle weakness (generalized): Secondary | ICD-10-CM | POA: Diagnosis not present

## 2021-10-14 DIAGNOSIS — M25661 Stiffness of right knee, not elsewhere classified: Secondary | ICD-10-CM

## 2021-10-14 DIAGNOSIS — M25662 Stiffness of left knee, not elsewhere classified: Secondary | ICD-10-CM

## 2021-10-14 DIAGNOSIS — M25561 Pain in right knee: Secondary | ICD-10-CM | POA: Diagnosis not present

## 2021-10-14 DIAGNOSIS — R293 Abnormal posture: Secondary | ICD-10-CM

## 2021-10-14 DIAGNOSIS — M25562 Pain in left knee: Secondary | ICD-10-CM | POA: Diagnosis not present

## 2021-10-14 NOTE — Therapy (Signed)
?OUTPATIENT PHYSICAL THERAPY TREATMENT NOTE/DISCHARGE NOTE ? ?PHYSICAL THERAPY DISCHARGE SUMMARY ? ?Visits from Start of Care: 9 ? ?Current functional level related to goals / functional outcomes: ?As indicated below ?  ?Remaining deficits: ?None except previous Left knee flexion post TKA limited at 50 Degrees which limits her sit to stand ability ?  ?Education / Equipment: ?HEP  ? ?Patient agrees to discharge. Patient goals were met. Patient is being discharged due to meeting the stated rehab goals. ?And being pleased with current level of function  ?Patient Name: [name] ?MRN: [id number] ?DOB:[birth year], 81 y.o., female ?Today's Date: 10/19/2021 ? ?PCP: Lucianne Lei, MD ?REFERRING PROVIDER: Lucianne Lei, MD ? ? PT End of Session - 10/19/21 1006   ? ? Visit Number 9   ? Number of Visits 12   ? Date for PT Re-Evaluation 10/21/21   ? Authorization Type MCR AARP 10th visit progress note 6th FOTO   ? PT Start Time 3419   ? PT Stop Time 1015   ? PT Time Calculation (min) 40 min   ? Activity Tolerance Patient tolerated treatment well   ? Behavior During Therapy [facility] for tasks assessed/performed   ? ?  ?  ? ?  ? ? ? ? ? ? ? ? ? ?Past Medical History:  ?Diagnosis Date  ? Asthma   ? allergic induced  ? Cough   ? Hypertension   ? Rhinitis   ? ?Past Surgical History:  ?Procedure Laterality Date  ? ABDOMINAL HYSTERECTOMY    ? BACK SURGERY    ? CATARACT EXTRACTION    ? MASS EXCISION  01/05/2012  ? Procedure: MINOR EXCISION OF MASS;  Surgeon: Cammie Sickle., MD;  Location: [place];  Service: Orthopedics;  Laterality: Right;  excision mucoid cyst right long finger, debride DIP joint  ? ROTATOR CUFF REPAIR Bilateral   ? TOTAL KNEE ARTHROPLASTY Left   ? twice  ? ?Patient Active Problem List  ? Diagnosis Date Noted  ? Reactive airway disease 07/29/2020  ? Seasonal and perennial allergic rhinitis 07/29/2020  ? Gastroesophageal reflux disease 07/29/2020  ? Unilateral primary osteoarthritis, right knee  11/21/2017  ? Status post revision of total knee, left 11/21/2017  ? Chronic rhinitis 02/25/2016  ? Cough 02/25/2016  ? Hoarseness of voice 02/25/2016  ? Chronic leukopenia 12/15/2015  ? ? ?REFERRING DIAG: pain in neck radiating to both shoulders ? ?THERAPY DIAG:  ?Cervicalgia ? ?Abnormal posture ? ?Muscle weakness (generalized) ? ?Chronic pain of left knee ? ?Chronic pain of right knee ? ?Stiffness of left knee, not elsewhere classified ? ?Difficulty in walking, not elsewhere classified ? ?PERTINENT HISTORY: HTN, asthma ? ?PRECAUTIONS: none ? ?SUBJECTIVE  This weekend will rain but if not I am going to be in my flower garden.  I have vincas and petunias. ?I am a 1/2 out of /10.  ? ? ?PAIN:  ?Are you having pain? Are you having pain? Yes: NPRS scale: 0/10 ?Pain location: neck dorso cervical fat pad radiates into scapula bil R>L ?Pain description: aching, sometimes sharp ?Aggravating factors: turning your neck ,driving and looking for blind spots , sleeping , prepping food in the kitchen ?Relieving factors: take motrin ?Pain at rest 4/10 but when turning neck pain is 5/10 ? ? ? ? ?OBJECTIVE:  ?  ?DIAGNOSTIC FINDINGS: 08-30-21 ?IMPRESSION: ?1. C4-5-C6-7 degenerative disc disease with associated uncovertebral ?hypertrophy and facet sclerosis. Facet sclerosis has progressed from ?04/15/2008. ?2. Multilevel bilateral neural foraminal narrowing. ?  ?PATIENT SURVEYS:  ?  FOTO 52 % predicted 63% ?10-05-21 64% ?  ?  ?COGNITION: ?Overall cognitive status: Within functional limits for tasks assessed ?  ?  ?SENSATION: ?WFL ?  ?POSTURE:  ?Forward Head, dorsocervical fat pad ?  ?PALPATION: ?TTP over        ?  ?CERVICAL ROM:  ?  ?Active ROM A/PROM (deg) ?09/09/2021 AROM ?Post TPDN ?09-16-21 AROM ?09-23-21 AROM ?10-19-21  ?Flexion 33  35 40  ?Extension 30 with pain  44 45  ?Right lateral flexion 15 with pain '20 29 29  ' ?Left lateral flexion 19with pain '20 24 25  ' ?Right rotation 40 45 54 60  ?Left rotation 49 50 59 64  ? (Blank rows = not  tested) ?  ?UE ROM: ?  ?Active ROM Right APROM ?09/09/2021 Left ?09/09/2021 Right ?10-19-21 Left ?10-19-21  ?Shoulder flexion 142/155 141/151 155/ 159 154/160  ?Shoulder extension        ?Shoulder abduction 131 135 140 146  ?Shoulder adduction        ?Shoulder extension        ?Shoulder internal rotation 42 53 55 62  ?Shoulder external rotation 85 90 90 90  ?Elbow flexion        ?Elbow extension        ? (Blank rows = not tested) ?  ?UE MMT: ?  ?MMT Right ?09/09/2021 Left ?09/09/2021 Right ?10-14-21 LEFT ?10-14-21  ?Shoulder flexion 4+ 4+ 5 5  ?Shoulder extension 4+ 4+ 5 5  ?Shoulder abduction '4 4 5 5  ' ?Shoulder adduction        ?Shoulder extension        ?Shoulder internal rotation '4 4 5 5  ' ?Shoulder external rotation 4- 4- 5 5  ?Middle trapezius        ?Lower trapezius        ?Elbow flexion        ?Elbow extension        ?Wrist supination        ?Grip strength 45.3 43 55 48  ? (Blank rows = not tested) ? Grip strength 09-28-21  R   58lb    L 51.3 lb Left hand dominant ?CERVICAL SPECIAL TESTS:  ?Neck flexor muscle endurance test: Positive, Upper limb tension test (ULTT): Negative, Spurling's test: Negative, and Distraction test: Negative ?Supine flexion endurance test 19 sec  ( normal 35 SEc) ? 09-23-21  26 sec ?10-14-21 Supine flexion endurance test 40 sec  ( normal 35 SEc) ?FUNCTIONAL TESTS:  ?5 times sit to stand: 34.61 ?Pt with left TKA limited flexion 50 degrees ?10-14-21 5 x STS 21.65 sec Pt with left TKA limited flexion 50 degrees with elevated seat with there ex pad ?  ?PATIENT SURVEYS:  ?FOTO 52 % predicted 63% ?  ?TODAY'S TREATMENT:  ?[facility] Adult PT Treatment:                                                DATE: 10-19-21 ?Therapeutic Exercise: ?AMRAP in  39mn ( As Many Repetitions as possible) 2 rounds ?Mini squat with 10 # in UE while holding onto  solid weight racke ?Latissimus pull on Right and on Left 1 x on R and L ?Step ups on step with Upper extremity support 10 x on R and L ?Overhead press with weight 10 # DB 8 x  max on R and L ? ? ?Review  HEP for home and final time then DC ?OH press 15 lb with bil UE press ?Nu step level 4 5 min with UE/LE  RPE 4/5 ? Self-care ?  Discussed community wellness options and gyms for continued strength including silver sneakers ? ?[facility] Adult PT Treatment:                                                DATE: 10-14-21 ?10-14-21 5 x STS 21.65 sec Pt with left TKA limited flexion 50 degrees with elevated seat with There ex pad ?10-14-21 Supine flexion endurance test 40 sec  ( normal 35 SEc) ?Therapeutic Exercise: ?Nu step level 4 5 min with UE/LE  RPE 4/5 ?OH  press bil UE with 15 # 1 x 10 20# 1 x 5 ?Upper  trap stretch and levator stretch after OH press set ?5 lb scaption bil 2 x 10 ?Star pattern BTB 2 x 10 ?Stand to sit with 10 lb  with UE support ascent and descent   2 x 10  ?Farmer's carry with 25 # KB for 200 feet ?Up and down steps with step to due to restricted knee motion on LLE but carrying 25 # KB and UE support ?Modalities- cervical hot pack ?Manual Therapy: ?STW over R upper trap and levator ?in supine  PA C-3 to C-7 with gentle overpressure for lateral glides and rotation ?Suboccipital release ?  Trigger Point Dry Needling Treatment: ?Pre-treatment instruction: Patient instructed on dry needling rationale, procedures, and possible side effects including pain during treatment (achy,cramping feeling), bruising, drop of blood, lightheadedness, nausea, sweating. ?Patient Consent Given: Yes ?Education handout provided: Previously provided ?Muscles treated: R upper trap and levator  ?Needle size and number: .30x35m x 4 ?Electrical stimulation performed: No ?Parameters: N/A ?Treatment response/outcome: Twitch response elicited and Palpable decrease in muscle tension ?Post-treatment instructions: Patient instructed to expect possible mild to moderate muscle soreness later today and/or tomorrow. Patient instructed in methods to reduce muscle soreness and to continue prescribed HEP. If patient was  dry needled over the lung field, patient was instructed on signs and symptoms of pneumothorax and, however unlikely, to see immediate medical attention should they occur. Patient was also educated on signs and

## 2021-10-19 ENCOUNTER — Ambulatory Visit: Payer: Medicare Other | Attending: Family Medicine | Admitting: Physical Therapy

## 2021-10-19 DIAGNOSIS — M25662 Stiffness of left knee, not elsewhere classified: Secondary | ICD-10-CM | POA: Diagnosis not present

## 2021-10-19 DIAGNOSIS — R293 Abnormal posture: Secondary | ICD-10-CM | POA: Diagnosis not present

## 2021-10-19 DIAGNOSIS — R262 Difficulty in walking, not elsewhere classified: Secondary | ICD-10-CM | POA: Insufficient documentation

## 2021-10-19 DIAGNOSIS — M6281 Muscle weakness (generalized): Secondary | ICD-10-CM | POA: Diagnosis not present

## 2021-10-19 DIAGNOSIS — M25562 Pain in left knee: Secondary | ICD-10-CM | POA: Insufficient documentation

## 2021-10-19 DIAGNOSIS — G8929 Other chronic pain: Secondary | ICD-10-CM | POA: Insufficient documentation

## 2021-10-19 DIAGNOSIS — M25561 Pain in right knee: Secondary | ICD-10-CM | POA: Insufficient documentation

## 2021-10-19 DIAGNOSIS — M542 Cervicalgia: Secondary | ICD-10-CM | POA: Insufficient documentation

## 2021-10-19 NOTE — Patient Instructions (Signed)
AMRAP in  45mn ( As Many Repetitions as possible) Do  2-3 rounds for exercise ?Mini squat with 10 # in UE while holding stable pole/ kitchen sink ?Latissimus pull on Right and on Left ?Step ups on step with Upper extremity support ?Overhead press with weight 10 # ? ?LVoncille Lo PT, ATRIC ?Certified Exercise Expert for the Aging Adult  ?10/19/21 10:07 AM ?Phone: [phone]?Fax: [phone] ?

## 2021-10-21 DIAGNOSIS — M62830 Muscle spasm of back: Secondary | ICD-10-CM | POA: Diagnosis not present

## 2021-10-21 DIAGNOSIS — E1169 Type 2 diabetes mellitus with other specified complication: Secondary | ICD-10-CM | POA: Diagnosis not present

## 2021-10-21 DIAGNOSIS — E119 Type 2 diabetes mellitus without complications: Secondary | ICD-10-CM | POA: Diagnosis not present

## 2021-10-27 ENCOUNTER — Telehealth: Payer: Self-pay | Admitting: Neurology

## 2021-10-27 ENCOUNTER — Other Ambulatory Visit: Payer: Self-pay | Admitting: Neurology

## 2021-10-27 DIAGNOSIS — G250 Essential tremor: Secondary | ICD-10-CM

## 2021-10-27 NOTE — Telephone Encounter (Signed)
Patient called and stated she is almost out of primidone.  She stated Dr told her to call so she could change how many she takes a day.  She uses the [facility] on [address]. ?

## 2021-10-28 ENCOUNTER — Other Ambulatory Visit: Payer: Self-pay

## 2021-10-28 NOTE — Telephone Encounter (Signed)
Prescription sent to [facility] and called patient to see if she had any questions on the change in dose. Patient understood and let me know she didn't have any questions   ?

## 2021-11-12 ENCOUNTER — Other Ambulatory Visit: Payer: Self-pay

## 2021-11-12 MED ORDER — MONTELUKAST SODIUM 10 MG PO TABS
10.0000 mg | ORAL_TABLET | Freq: Every day | ORAL | 1 refills | Status: DC
Start: 2021-11-12 — End: 2022-04-26

## 2021-11-16 NOTE — Patient Outreach (Signed)
Received a Health Coach referral notification for Ms. Tasker. I have assigned Johny Shock, RN to call for follow up and determine if there are any Case Management needs.    [facility], [place], [place] [facility] [phone]

## 2021-11-18 ENCOUNTER — Other Ambulatory Visit: Payer: Self-pay | Admitting: *Deleted

## 2021-11-19 NOTE — Patient Outreach (Signed)
[facility]) Care Management  11/19/2021  [name] 21-Nov-1940 [id number]   RN Health Coach telephone call to patient.  [employer] compliance verified. RN described the services of [facility] and the goal.s RN described the role of registered nurse, social workers and the pharmacist. Patient declined the services at this time, but has agreed to 6 month educational mailing.   Plan: RN sent stoke acronym sheet F.A.S.T. RN sent A matter of choice blood pressure control RN Health Coach will follow up with next 6 month educational mailing in December  Plan: [place] Management [phone]

## 2021-11-25 DIAGNOSIS — Z23 Encounter for immunization: Secondary | ICD-10-CM | POA: Diagnosis not present

## 2021-11-28 ENCOUNTER — Other Ambulatory Visit: Payer: Self-pay | Admitting: Allergy

## 2021-11-30 ENCOUNTER — Telehealth: Payer: Self-pay | Admitting: Allergy

## 2021-11-30 MED ORDER — CARBINOXAMINE MALEATE 6 MG PO TABS: ORAL_TABLET | ORAL | 2 refills | Status: DC

## 2021-11-30 NOTE — Telephone Encounter (Signed)
Patient called and would like for you to call [facility] into [facility] [phone]

## 2021-11-30 NOTE — Telephone Encounter (Signed)
Refill as requested. Patient have been notified.

## 2021-12-24 ENCOUNTER — Other Ambulatory Visit: Payer: Self-pay | Admitting: *Deleted

## 2021-12-24 NOTE — Patient Outreach (Signed)
[facility]) Care Management  12/24/2021  [name] 12-02-1940 [id number]   .RN Health Coach closed patient's case. A case closed letter sent patient.  [facility] has discontinued the educational six month mailing program.  Plan: Closure letter sent to patient  [place] Management [phone]

## 2022-01-20 ENCOUNTER — Telehealth: Payer: Self-pay | Admitting: Allergy

## 2022-01-20 MED ORDER — AZELASTINE HCL 0.1 % NA SOLN: 2.0000 | Freq: Two times a day (BID) | NASAL | 5 refills | Status: DC

## 2022-01-20 NOTE — Telephone Encounter (Signed)
[name] called in and states she needs refills on Azelastine nasal spray and would like it sent to [facility] on [address]

## 2022-01-20 NOTE — Telephone Encounter (Signed)
Rx sent to pharmacy as requested.

## 2022-02-02 ENCOUNTER — Ambulatory Visit (INDEPENDENT_AMBULATORY_CARE_PROVIDER_SITE_OTHER): Payer: Medicare Other | Admitting: Family

## 2022-02-02 ENCOUNTER — Encounter: Payer: Self-pay | Admitting: Family

## 2022-02-02 ENCOUNTER — Ambulatory Visit: Payer: Self-pay

## 2022-02-02 DIAGNOSIS — M25561 Pain in right knee: Secondary | ICD-10-CM | POA: Diagnosis not present

## 2022-02-02 DIAGNOSIS — M1711 Unilateral primary osteoarthritis, right knee: Secondary | ICD-10-CM

## 2022-02-02 DIAGNOSIS — M25562 Pain in left knee: Secondary | ICD-10-CM | POA: Diagnosis not present

## 2022-02-04 ENCOUNTER — Telehealth: Payer: Self-pay | Admitting: Family

## 2022-02-04 NOTE — Telephone Encounter (Signed)
[name] saw pt this week and would like to get her approved for gel injection for right knee.  Thank you!

## 2022-02-07 NOTE — Telephone Encounter (Signed)
VOB submitted for Monovisc, right knee. 

## 2022-02-08 ENCOUNTER — Other Ambulatory Visit: Payer: Self-pay

## 2022-02-08 DIAGNOSIS — M1711 Unilateral primary osteoarthritis, right knee: Secondary | ICD-10-CM

## 2022-02-09 DIAGNOSIS — M1711 Unilateral primary osteoarthritis, right knee: Secondary | ICD-10-CM

## 2022-02-09 MED ORDER — LIDOCAINE HCL 1 % IJ SOLN
5.0000 mL | INTRAMUSCULAR | Status: AC | PRN
  Administered 2022-02-09: 5 mL

## 2022-02-09 MED ORDER — METHYLPREDNISOLONE ACETATE 40 MG/ML IJ SUSP
40.0000 mg | INTRAMUSCULAR | Status: AC | PRN
  Administered 2022-02-09: 40 mg via INTRA_ARTICULAR

## 2022-02-09 NOTE — Progress Notes (Signed)
Office Visit Note   Patient: [name]           Date of Birth: [birth year]           MRN: [id number] Visit Date: 02/02/2022              Requested by: Lucianne Lei, [address] [place],  [place] [address] PCP: Lucianne Lei, MD  Chief Complaint  Patient presents with   Left Knee - Pain   Right Knee - Pain      HPI: The patient is a 81 year old woman who presents today concerned for significant weight and worse right knee pain she does have some chronic knee pain from osteoarthritis this has been gradually worsening she began Sunday having excruciating achy pain has not had an injury or fall however her knee has been catching and giving way  She has had previous left total knee arthroplasty and has had significant pain and loss of range of motion since surgery it has been many years she has not been able to fully extend the knee since.  [place] due to difficulty with the left she relies on the right  Previous cortisone injection of the right knee has not been useful in pain reduction.  Assessment & Plan: Visit Diagnoses:  1. Acute pain of both knees     Plan: Depo-Medrol injection of the right knee for osteoarthritis.  We will request authorization for supplemental injection of the right knee as well.  Follow-Up Instructions: No follow-ups on file.   Right Knee Exam   Muscle Strength  The patient has normal right knee strength.  Tenderness  The patient is experiencing tenderness in the medial joint line and lateral joint line.  Range of Motion  The patient has normal right knee ROM.  Tests  Varus: negative Valgus: negative  Other  Erythema: absent Swelling: mild Effusion: no effusion present      Patient is alert, oriented, no adenopathy, well-dressed, normal affect, normal respiratory effort.   Imaging: No results found. No images are attached to the encounter.  Labs: No results found for: "HGBA1C", "ESRSEDRATE",  "CRP", "LABURIC", "REPTSTATUS", "GRAMSTAIN", "CULT", "LABORGA"   No results found for: "ALBUMIN", "PREALBUMIN", "CBC"  No results found for: "MG" No results found for: "VD25OH"  No results found for: "PREALBUMIN"    Latest Ref Rng & Units 11/09/2005    1:23 PM  CBC EXTENDED  WBC 3.9 - 10.0 10e3/uL 2.5   RBC 3.70 - 5.32 10e6/uL 4.61   Hemoglobin 11.6 - 15.9 g/dL 14.1   HCT 34.8 - 46.6 % 41.8   Platelets 145 - 400 10e3/uL 174   NEUT# 1.5 - 6.5 10e3/uL 0.8   Lymph# 0.9 - 3.3 10e3/uL 1.4      There is no height or weight on file to calculate BMI.  Orders:  Orders Placed This Encounter  Procedures   XR Knee 1-2 Views Left   XR Knee 1-2 Views Right   No orders of the defined types were placed in this encounter.    Procedures: Large Joint Inj on 02/09/2022 10:38 AM Indications: pain Details: 18 G 1.5 in needle, anteromedial approach Medications: 5 mL lidocaine 1 %; 40 mg methylPREDNISolone acetate 40 MG/ML Consent was given by the patient.     Clinical Data: No additional findings.  ROS:  All other systems negative, except as noted in the HPI. Review of Systems  Objective: Vital Signs: There were no vitals  taken for this visit.  Specialty Comments:  No specialty comments available.  PMFS History: Patient Active Problem List   Diagnosis Date Noted   Reactive airway disease 07/29/2020   Seasonal and perennial allergic rhinitis 07/29/2020   Gastroesophageal reflux disease 07/29/2020   Unilateral primary osteoarthritis, right knee 11/21/2017   Status post revision of total knee, left 11/21/2017   Chronic rhinitis 02/25/2016   Cough 02/25/2016   Hoarseness of voice 02/25/2016   Chronic leukopenia 12/15/2015   Past Medical History:  Diagnosis Date   Asthma    allergic induced   Cough    Hypertension    Rhinitis     Family History  Problem Relation Age of Onset   Other Mother        died when patient was 19 months old   Hypertension Father     Cerebral aneurysm Sister    Breast cancer Sister    Lung cancer Sister    Healthy Son    Allergic rhinitis Neg Hx    Angioedema Neg Hx    Asthma Neg Hx    Eczema Neg Hx    Immunodeficiency Neg Hx    Urticaria Neg Hx     Past Surgical History:  Procedure Laterality Date   ABDOMINAL HYSTERECTOMY     BACK SURGERY     CATARACT EXTRACTION     MASS EXCISION  01/05/2012   Procedure: MINOR EXCISION OF MASS;  Surgeon: Cammie Sickle., MD;  Location: [place];  Service: Orthopedics;  Laterality: Right;  excision mucoid cyst right long finger, debride DIP joint   ROTATOR CUFF REPAIR Bilateral    TOTAL KNEE ARTHROPLASTY Left    twice   Social History   Occupational History   Occupation: retired    Comment: Warehouse manager  Tobacco Use   Smoking status: Never   Smokeless tobacco: Never  Vaping Use   Vaping Use: Never used  Substance and Sexual Activity   Alcohol use: No    Alcohol/week: 0.0 standard drinks of alcohol   Drug use: No   Sexual activity: Not on file    Comment: retired from [employer]. 1 son.

## 2022-02-22 DIAGNOSIS — I10 Essential (primary) hypertension: Secondary | ICD-10-CM | POA: Diagnosis not present

## 2022-02-22 DIAGNOSIS — R251 Tremor, unspecified: Secondary | ICD-10-CM | POA: Diagnosis not present

## 2022-02-22 DIAGNOSIS — Z0000 Encounter for general adult medical examination without abnormal findings: Secondary | ICD-10-CM | POA: Diagnosis not present

## 2022-02-22 DIAGNOSIS — Z23 Encounter for immunization: Secondary | ICD-10-CM | POA: Diagnosis not present

## 2022-02-22 DIAGNOSIS — D729 Disorder of white blood cells, unspecified: Secondary | ICD-10-CM | POA: Diagnosis not present

## 2022-02-24 ENCOUNTER — Ambulatory Visit (INDEPENDENT_AMBULATORY_CARE_PROVIDER_SITE_OTHER): Payer: Medicare Other | Admitting: Orthopedic Surgery

## 2022-02-24 DIAGNOSIS — M170 Bilateral primary osteoarthritis of knee: Secondary | ICD-10-CM | POA: Diagnosis not present

## 2022-02-24 DIAGNOSIS — G8929 Other chronic pain: Secondary | ICD-10-CM

## 2022-02-25 ENCOUNTER — Encounter: Payer: Self-pay | Admitting: Orthopedic Surgery

## 2022-02-25 ENCOUNTER — Ambulatory Visit: Payer: Medicare Other | Admitting: Allergy

## 2022-02-25 DIAGNOSIS — M25561 Pain in right knee: Secondary | ICD-10-CM

## 2022-02-25 DIAGNOSIS — G8929 Other chronic pain: Secondary | ICD-10-CM

## 2022-02-25 DIAGNOSIS — M25562 Pain in left knee: Secondary | ICD-10-CM

## 2022-02-25 MED ORDER — HYALURONAN 88 MG/4ML IX SOSY
88.0000 mg | PREFILLED_SYRINGE | INTRA_ARTICULAR | Status: AC | PRN
  Administered 2022-02-25: 88 mg via INTRA_ARTICULAR

## 2022-02-25 MED ORDER — LIDOCAINE HCL 1 % IJ SOLN
1.0000 mL | INTRAMUSCULAR | Status: AC | PRN
  Administered 2022-02-25: 1 mL

## 2022-02-25 NOTE — Progress Notes (Signed)
Office Visit Note   Patient: [name]           Date of Birth: 19-Nov-1940           MRN: [id number] Visit Date: 02/24/2022              Requested by: Lucianne Lei, MD [address] [place],  [place] [address] PCP: Lucianne Lei, MD  Chief Complaint  Patient presents with  . Right Knee - Follow-up      HPI: Patient is a 81 year old woman with osteoarthritis bilateral knees.  Patient has had temporary relief with previous steroid injections.  Patient presents with persistent bilateral knee pain.  Assessment & Plan: Visit Diagnoses:  1. Chronic pain of both knees     Plan: Both knees were injected with Monovisc.  She tolerated this well.  Follow-Up Instructions: Return if symptoms worsen or fail to improve.   Ortho Exam  Patient is alert, oriented, no adenopathy, well-dressed, normal affect, normal respiratory effort. Examination patient has no redness or cellulitis of either knee there is a mild effusion there is crepitation with range of motion collaterals and cruciates are stable.  Imaging: No results found. No images are attached to the encounter.  Labs: No results found for: "HGBA1C", "ESRSEDRATE", "CRP", "LABURIC", "REPTSTATUS", "GRAMSTAIN", "CULT", "LABORGA"   No results found for: "ALBUMIN", "PREALBUMIN", "CBC"  No results found for: "MG" No results found for: "VD25OH"  No results found for: "PREALBUMIN"    Latest Ref Rng & Units 11/09/2005    1:23 PM  CBC EXTENDED  WBC 3.9 - 10.0 10e3/uL 2.5   RBC 3.70 - 5.32 10e6/uL 4.61   Hemoglobin 11.6 - 15.9 g/dL 14.1   HCT 34.8 - 46.6 % 41.8   Platelets 145 - 400 10e3/uL 174   NEUT# 1.5 - 6.5 10e3/uL 0.8   Lymph# 0.9 - 3.3 10e3/uL 1.4      There is no height or weight on file to calculate BMI.  Orders:  No orders of the defined types were placed in this encounter.  No orders of the defined types were placed in this encounter.    Procedures: Large Joint Inj: bilateral knee on 02/25/2022 4:42  PM Indications: pain and diagnostic evaluation Details: 22 G 1.5 in needle, anteromedial approach  Arthrogram: No  Medications (Right): 88 mg Hyaluronan 88 MG/4ML; 1 mL lidocaine 1 % Medications (Left): 88 mg Hyaluronan 88 MG/4ML; 1 mL lidocaine 1 % Outcome: tolerated well, no immediate complications Procedure, treatment alternatives, risks and benefits explained, specific risks discussed. Consent was given by the patient. Immediately prior to procedure a time out was called to verify the correct patient, procedure, equipment, support staff and site/side marked as required. Patient was prepped and draped in the usual sterile fashion.     Clinical Data: No additional findings.  ROS:  All other systems negative, except as noted in the HPI. Review of Systems  Objective: Vital Signs: There were no vitals taken for this visit.  Specialty Comments:  No specialty comments available.  PMFS History: Patient Active Problem List   Diagnosis Date Noted  . Reactive airway disease 07/29/2020  . Seasonal and perennial allergic rhinitis 07/29/2020  . Gastroesophageal reflux disease 07/29/2020  . Unilateral primary osteoarthritis, right knee 11/21/2017  . Status post revision of total knee, left 11/21/2017  . Chronic rhinitis 02/25/2016  . Cough 02/25/2016  . Hoarseness of voice 02/25/2016  . Chronic leukopenia 12/15/2015   Past Medical History:  Diagnosis Date  .  Asthma    allergic induced  . Cough   . Hypertension   . Rhinitis     Family History  Problem Relation Age of Onset  . Other Mother        died when patient was 1 months old  . Hypertension Father   . Cerebral aneurysm Sister   . Breast cancer Sister   . Lung cancer Sister   . Healthy Son   . Allergic rhinitis Neg Hx   . Angioedema Neg Hx   . Asthma Neg Hx   . Eczema Neg Hx   . Immunodeficiency Neg Hx   . Urticaria Neg Hx     Past Surgical History:  Procedure Laterality Date  . ABDOMINAL HYSTERECTOMY    .  BACK SURGERY    . CATARACT EXTRACTION    . MASS EXCISION  01/05/2012   Procedure: MINOR EXCISION OF MASS;  Surgeon: Cammie Sickle., MD;  Location: [place];  Service: Orthopedics;  Laterality: Right;  excision mucoid cyst right long finger, debride DIP joint  . ROTATOR CUFF REPAIR Bilateral   . TOTAL KNEE ARTHROPLASTY Left    twice   Social History   Occupational History  . Occupation: retired    Comment: Warehouse manager  Tobacco Use  . Smoking status: Never  . Smokeless tobacco: Never  Vaping Use  . Vaping Use: Never used  Substance and Sexual Activity  . Alcohol use: No    Alcohol/week: 0.0 standard drinks of alcohol  . Drug use: No  . Sexual activity: Not on file    Comment: retired from [employer]. 1 son.

## 2022-03-07 ENCOUNTER — Other Ambulatory Visit: Payer: Self-pay | Admitting: Allergy

## 2022-03-10 DIAGNOSIS — H6123 Impacted cerumen, bilateral: Secondary | ICD-10-CM | POA: Diagnosis not present

## 2022-03-11 DIAGNOSIS — Z23 Encounter for immunization: Secondary | ICD-10-CM | POA: Diagnosis not present

## 2022-03-16 ENCOUNTER — Ambulatory Visit (INDEPENDENT_AMBULATORY_CARE_PROVIDER_SITE_OTHER): Payer: Medicare Other | Admitting: Allergy

## 2022-03-16 ENCOUNTER — Encounter: Payer: Self-pay | Admitting: Allergy

## 2022-03-16 ENCOUNTER — Other Ambulatory Visit: Payer: Self-pay

## 2022-03-16 VITALS — BP 106/64 | HR 76 | Temp 97.7°F | Resp 17

## 2022-03-16 DIAGNOSIS — J4540 Moderate persistent asthma, uncomplicated: Secondary | ICD-10-CM | POA: Diagnosis not present

## 2022-03-16 DIAGNOSIS — J3089 Other allergic rhinitis: Secondary | ICD-10-CM

## 2022-03-16 DIAGNOSIS — J302 Other seasonal allergic rhinitis: Secondary | ICD-10-CM

## 2022-03-16 MED ORDER — IPRATROPIUM BROMIDE 0.06 % NA SOLN: 2.0000 | Freq: Three times a day (TID) | NASAL | 5 refills | Status: DC | PRN

## 2022-03-16 MED ORDER — AZELASTINE HCL 0.1 % NA SOLN: 2.0000 | Freq: Two times a day (BID) | NASAL | 2 refills | Status: DC

## 2022-03-16 MED ORDER — SYMBICORT 160-4.5 MCG/ACT IN AERO: 2.0000 | INHALATION_SPRAY | Freq: Two times a day (BID) | RESPIRATORY_TRACT | 5 refills | Status: DC

## 2022-03-16 NOTE — Patient Instructions (Addendum)
Cough Continue montelukast 10 mg once a day to prevent cough or wheeze Continue Symbicort 160-2 puffs 1-2 times a day to prevent cough or wheeze Continue Xopenex 2 puffs once every 4-6 hours as needed for cough or wheeze For asthma flare, increase Symbicort 160 to 2 puffs twice a day with a spacer for 2 weeks or when cough and wheeze free then return to previous dosing  Allergic rhinitis Continue allergen avoidance measures Continue Flonase 1-2 sprays in each nostril daily for 1-2 weeks at a time before stopping once nasal congestion improves for maximum benefit Use Azelastine 2 sprays in each nostril twice a day now for nasal drainage control Use ipratropium 2 sprays in each nostril up to three times a day as needed for a runny nose In the right nostril, point the applicator out toward the right ear. In the left nostril, point the applicator out toward the left ear Continue saline nasal rinses as needed for nasal symptoms. Use this before any medicated nasal sprays for best result Use RyVent (carbinoxamine) 6-8 mg twice a day to control nasal symptoms.    Follow up in 6 months or sooner if needed.

## 2022-03-16 NOTE — Progress Notes (Signed)
Follow-up Note  RE: [name] MRN: [id number] DOB: 1941-01-12 Date of Office Visit: 03/16/2022   History of present illness: [name] is a 81 y.o. female presenting today for follow-up of allergic rhinitis and reactive airway.  She was last seen in the office on 08/25/2021 by myself.  She states she has been doing well since her last visit without any major health changes, surgeries or hospitalizations.  She states her breathing has been doing well.  She has not had any issues with shortness of breath.  She states she has been walking outside and when she can walk outside she goes up and down her stairs without issue.  She states she has needed to use Xopenex a handful of times since the last visit.  She is taking her Symbicort 162 puffs once a day in the mornings.  She does note however sometimes at night that she may have some cough symptoms.  However she has not needed to use her Xopenex.  She has not had any ED or urgent care visits or any systemic steroid needs. With her allergies she states that the medications she is using currently are keeping things pretty controlled however she does still have some drainage.  She does use Flonase mid day.  She uses azelastine 2 sprays twice a day.  She states the azelastine does help with her drainage control.  The ipratropium nasal spray does not sound familiar to her at this time but she is not using this.  She did get a patient without RyVent after the last visit but states she never got any further refills sent to her.  That she read out of the RyVent she had.  She currently is not taking any antihistamines.   Review of systems: Review of Systems  Constitutional: Negative.   HENT:  Positive for postnasal drip.   Eyes: Negative.   Respiratory: Negative.    Cardiovascular: Negative.   Gastrointestinal: Negative.   Musculoskeletal: Negative.   Skin: Negative.   Allergic/Immunologic: Negative.   Neurological: Negative.      All  other systems negative unless noted above in HPI  Past medical/social/surgical/family history have been reviewed and are unchanged unless specifically indicated below.  No changes  Medication List: Current Outpatient Medications  Medication Sig Dispense Refill   Ascorbic Acid (VITAMIN C) 250 MG CHEW Chew 250 mg by mouth.     azelastine (ASTELIN) 0.1 % nasal spray Place 2 sprays into both nostrils 2 (two) times daily. Use in each nostril as directed 90 mL 5   budesonide-formoterol (SYMBICORT) 160-4.5 MCG/ACT inhaler Inhale 2 puffs into the lungs 2 (two) times daily. 10.2 g 5   CALCIUM-MAGNESIUM-ZINC PO Take by mouth.     Carbinoxamine Maleate (RYVENT) 6 MG TABS Take 6-8 mg by mouth twice a day to control nasal symptoms 120 tablet 2   cholecalciferol (VITAMIN D3) 25 MCG (1000 UT) tablet Take 1,000 Units by mouth daily.     diclofenac sodium (VOLTAREN) 1 % GEL      Diclofenac Sodium 1.5 % SOLN Place onto the skin. Reported on 08/26/2015     docusate sodium (COLACE) 100 MG capsule Take 100 mg by mouth daily as needed for mild constipation.     fluticasone (FLONASE) 50 MCG/ACT nasal spray SHAKE LIQUID AND USE 2 SPRAYS IN EACH NOSTRIL DAILY 48 g 1   levalbuterol (XOPENEX HFA) 45 MCG/ACT inhaler Inhale 2 puffs into the lungs every 4 (four) hours as needed. 15 g  1   montelukast (SINGULAIR) 10 MG tablet Take 1 tablet (10 mg total) by mouth at bedtime. 90 tablet 1   multivitamin-iron-minerals-folic acid (CENTRUM) chewable tablet Chew 1 tablet by mouth daily.     NEXLETOL 180 MG TABS Take 1 tablet by mouth daily.     olmesartan-hydrochlorothiazide (BENICAR HCT) 20-12.5 MG per tablet Take 1 tablet by mouth daily.     primidone (MYSOLINE) 50 MG tablet TAKE 4 TABLETS BY MOUTH EVERY MORNING THEN TAKE 1 TABLET BY MOUTH EVERY NIGHT AT BEDTIME 450 tablet 1   betamethasone dipropionate (DIPROLENE) 0.05 % ointment  (Patient not taking: Reported on 09/09/2021)     No current facility-administered medications for  this visit.     Known medication allergies: Allergies  Allergen Reactions   Latex Shortness Of Breath and Other (See Comments)     Physical examination: Blood pressure 106/64, pulse 76, temperature 97.7 F (36.5 C), temperature source Temporal, resp. rate 17, SpO2 99 %.  General: Alert, interactive, in no acute distress. HEENT: PERRLA, TMs pearly gray, turbinates non-edematous without discharge, post-pharynx non erythematous. Neck: Supple without lymphadenopathy. Lungs: Clear to auscultation without wheezing, rhonchi or rales. {no increased work of breathing. CV: Normal S1, S2 without murmurs. Abdomen: Nondistended, nontender. Skin: Warm and dry, without lesions or rashes. Extremities:  No clubbing, cyanosis or edema. Neuro:   Grossly intact.  Diagnositics/Labs: None today  Assessment and plan: Cough, reactive airway Continue montelukast 10 mg once a day to prevent cough or wheeze Continue Symbicort 160-2 puffs 1-2 times a day to prevent cough or wheeze Continue Xopenex 2 puffs once every 4-6 hours as needed for cough or wheeze For asthma flare, increase Symbicort 160 to 2 puffs twice a day with a spacer for 2 weeks or when cough and wheeze free then return to previous dosing  Allergic rhinitis Continue allergen avoidance measures Continue Flonase 1-2 sprays in each nostril daily for 1-2 weeks at a time before stopping once nasal congestion improves for maximum benefit Use Azelastine 2 sprays in each nostril twice a day now for nasal drainage control Use ipratropium 2 sprays in each nostril up to three times a day as needed for a runny nose In the right nostril, point the applicator out toward the right ear. In the left nostril, point the applicator out toward the left ear Continue saline nasal rinses as needed for nasal symptoms. Use this before any medicated nasal sprays for best result Use RyVent (carbinoxamine) 6-8 mg twice a day to control nasal symptoms.    Follow up  in 6 months or sooner if needed.  I appreciate the opportunity to take part in [name]'s care. Please do not hesitate to contact me with questions.  Sincerely,   Prudy Feeler, MD Allergy/Immunology [facility]

## 2022-03-17 MED ORDER — CARBINOXAMINE MALEATE 6 MG PO TABS: ORAL_TABLET | ORAL | 2 refills | Status: DC

## 2022-03-21 ENCOUNTER — Encounter: Payer: Self-pay | Admitting: Physician Assistant

## 2022-03-21 ENCOUNTER — Ambulatory Visit (INDEPENDENT_AMBULATORY_CARE_PROVIDER_SITE_OTHER): Payer: Medicare Other

## 2022-03-21 ENCOUNTER — Ambulatory Visit (INDEPENDENT_AMBULATORY_CARE_PROVIDER_SITE_OTHER): Payer: Medicare Other | Admitting: Physician Assistant

## 2022-03-21 DIAGNOSIS — M25551 Pain in right hip: Secondary | ICD-10-CM | POA: Diagnosis not present

## 2022-03-21 DIAGNOSIS — M1611 Unilateral primary osteoarthritis, right hip: Secondary | ICD-10-CM

## 2022-03-21 MED ORDER — MELOXICAM 7.5 MG PO TABS: 7.5000 mg | ORAL_TABLET | Freq: Every day | ORAL | 0 refills | Status: DC

## 2022-03-21 NOTE — Progress Notes (Signed)
Office Visit Note   Patient: [name]           Date of Birth: June 06, 1941           MRN: [id number] Visit Date: 03/21/2022              Requested by: Sonya Lei, MD [address] [place],  [place] [address] PCP: Sonya Lei, MD   Assessment & Plan: Visit Diagnoses:  1. Pain in right hip   2. Arthritis of right hip     Plan: Pleasant 81 year old woman with a long history of arthritis in her knees and back.  Presents today with a 4-day history of right groin pain.  She denies any fall or other twisting motions.  She denies any nausea vomiting or abdominal pain.  She has no change or loss of bowel or back bladder control.  No flank pain.  No fever or chills.  She said the pain was significant enough that she could not bear weight.  She also has trouble lifting her leg to put on her pants.  Denies any radicular symptoms such as paresthesias or weakness.  She said it is slightly better today.  She is using a cane and certainly is having some difficulty walking.  X-rays were fairly benign.  Because of the acuteness of her symptoms I have recommended a CAT scan as soon as possible.  If she has no occult fracture no other abnormalities it may be worth trying an injection into her hip patient was given strict follow-up instructions if she does not follow-up with any other symptoms such as fever chills increasing pain nausea vomiting she is to go to the emergency room.  Have given her a small dose of meloxicam.  She understands not to take ibuprofen or other anti-inflammatories when she is taking this and should take it with food  Follow-Up Instructions: No follow-ups on file.   Orders:  Orders Placed This Encounter  Procedures   XR HIP UNILAT W OR W/O PELVIS 2-3 VIEWS RIGHT   CT HIP RIGHT WO CONTRAST   No orders of the defined types were placed in this encounter.     Procedures: No procedures performed   Clinical Data: No additional findings.   Subjective: Chief  Complaint  Patient presents with   Right Hip - Pain    HPI pleasant 81 year old woman with 4-day history of right hip pain.  No no head injury.  Pain was severe enough that she could not bear weight.  She is able to bear weight today but is using a cane  Review of Systems  All other systems reviewed and are negative.    Objective: Vital Signs: There were no vitals taken for this visit.  Physical Exam Constitutional:      Appearance: Normal appearance.  Pulmonary:     Effort: Pulmonary effort is normal.  Skin:    General: Skin is warm and dry.  Neurological:     Mental Status: She is alert.     Ortho Exam Examination of her right hip she is walking with an antalgic gait using a cane.  She is focally tender in the groin.  No palpable masses in her lower abdomen no tenderness to palpation over her abdomen.  No tenderness over the lateral hip.  Distal circulation on the right side is intact compartments are soft nontender negative Homans' sign.  Sensation is intact.  She has 5 out of 5 strength with dorsiflexion plantarflexion of  her ankle extension flexion of her knees.  She is able to sustain a straight leg raise.  She has some stiffness with internal/external rotation of her hip but not terribly painful.  She is painful when she flexes her hip Specialty Comments:  No specialty comments available.  Imaging: XR HIP UNILAT W OR W/O PELVIS 2-3 VIEWS RIGHT  Result Date: 03/21/2022 Radiographs of her right hip were obtained in multiple projections today.  Femoral head is well reduced in the acetabulum with overall good congruent spacing.  Some sclerotic changes at the superior acetabulum no evidence of any obvious fracture or dislocation or other osseous injuries.  By x-ray mild degenerative changes no abnormalities noted in the pelvis    PMFS History: Patient Active Problem List   Diagnosis Date Noted   Arthritis of right hip 03/21/2022   Reactive airway disease 07/29/2020    Seasonal and perennial allergic rhinitis 07/29/2020   Gastroesophageal reflux disease 07/29/2020   Unilateral primary osteoarthritis, right knee 11/21/2017   Status post revision of total knee, left 11/21/2017   Chronic rhinitis 02/25/2016   Cough 02/25/2016   Hoarseness of voice 02/25/2016   Chronic leukopenia 12/15/2015   Past Medical History:  Diagnosis Date   Asthma    allergic induced   Cough    Hypertension    Rhinitis     Family History  Problem Relation Age of Onset   Other Mother        died when patient was 8 months old   Hypertension Father    Cerebral aneurysm Sister    Breast cancer Sister    Lung cancer Sister    Healthy Son    Allergic rhinitis Neg Hx    Angioedema Neg Hx    Asthma Neg Hx    Eczema Neg Hx    Immunodeficiency Neg Hx    Urticaria Neg Hx     Past Surgical History:  Procedure Laterality Date   ABDOMINAL HYSTERECTOMY     BACK SURGERY     CATARACT EXTRACTION     MASS EXCISION  01/05/2012   Procedure: MINOR EXCISION OF MASS;  Surgeon: Cammie Sickle., MD;  Location: [facility];  Service: Orthopedics;  Laterality: Right;  excision mucoid cyst right long finger, debride DIP joint   ROTATOR CUFF REPAIR Bilateral    TOTAL KNEE ARTHROPLASTY Left    twice   Social History   Occupational History   Occupation: retired    Comment: Warehouse manager  Tobacco Use   Smoking status: Never   Smokeless tobacco: Never  Vaping Use   Vaping Use: Never used  Substance and Sexual Activity   Alcohol use: No    Alcohol/week: 0.0 standard drinks of alcohol   Drug use: No   Sexual activity: Not on file    Comment: retired from [employer]. 1 son.

## 2022-03-23 ENCOUNTER — Ambulatory Visit
Admission: RE | Admit: 2022-03-23 | Discharge: 2022-03-23 | Disposition: A | Discharge status: Home / Self Care | Payer: Medicare Other | Source: Ambulatory Visit | Attending: Physician Assistant | Admitting: Physician Assistant

## 2022-03-23 DIAGNOSIS — M25551 Pain in right hip: Secondary | ICD-10-CM

## 2022-03-24 ENCOUNTER — Telehealth: Payer: Self-pay | Admitting: Physician Assistant

## 2022-03-24 NOTE — Telephone Encounter (Signed)
I spoke with [name] regarding the results of her CT scan.  She is stating she is feeling a little bit better and is taking a low-dose of meloxicam.  Still has some pain in the groin but is getting around better.  She does have mild to moderate hip arthritis.  Also arthritis in the pubic symphysis and sacroiliac joints.  She also has severe L4-5 and L5-S1 degenerative disc and endplate changes.  I did tell her that we could try a diagnostic injection into her hip but I could not guarantee that this was the source of her pain.  Since she is getting better she just would like to continue to treat this conservatively with some meloxicam and see how she does.  She has any increase in pain or other concerns she will call me.  Her CT scan did not show any evidence of occult fracture

## 2022-04-26 ENCOUNTER — Other Ambulatory Visit: Payer: Self-pay | Admitting: Allergy & Immunology

## 2022-04-26 ENCOUNTER — Other Ambulatory Visit: Payer: Self-pay | Admitting: Neurology

## 2022-04-26 DIAGNOSIS — G250 Essential tremor: Secondary | ICD-10-CM

## 2022-05-20 ENCOUNTER — Ambulatory Visit: Payer: Self-pay | Admitting: *Deleted

## 2022-06-15 DIAGNOSIS — E782 Mixed hyperlipidemia: Secondary | ICD-10-CM | POA: Diagnosis not present

## 2022-06-15 DIAGNOSIS — I10 Essential (primary) hypertension: Secondary | ICD-10-CM | POA: Diagnosis not present

## 2022-06-15 DIAGNOSIS — E1169 Type 2 diabetes mellitus with other specified complication: Secondary | ICD-10-CM | POA: Diagnosis not present

## 2022-06-16 DIAGNOSIS — I10 Essential (primary) hypertension: Secondary | ICD-10-CM | POA: Diagnosis not present

## 2022-06-16 DIAGNOSIS — R251 Tremor, unspecified: Secondary | ICD-10-CM | POA: Diagnosis not present

## 2022-06-16 DIAGNOSIS — E782 Mixed hyperlipidemia: Secondary | ICD-10-CM | POA: Diagnosis not present

## 2022-06-27 ENCOUNTER — Ambulatory Visit: Payer: Self-pay

## 2022-06-27 ENCOUNTER — Ambulatory Visit (INDEPENDENT_AMBULATORY_CARE_PROVIDER_SITE_OTHER): Payer: Medicare Other | Admitting: Physician Assistant

## 2022-06-27 ENCOUNTER — Encounter: Payer: Self-pay | Admitting: Physician Assistant

## 2022-06-27 DIAGNOSIS — M545 Low back pain, unspecified: Secondary | ICD-10-CM

## 2022-06-27 MED ORDER — TRAMADOL HCL 50 MG PO TABS: 50.0000 mg | ORAL_TABLET | Freq: Two times a day (BID) | ORAL | 0 refills | Status: DC | PRN

## 2022-06-27 MED ORDER — MELOXICAM 7.5 MG PO TABS: 7.5000 mg | ORAL_TABLET | Freq: Every day | ORAL | 0 refills | Status: DC

## 2022-06-27 NOTE — Progress Notes (Signed)
Office Visit Note   Patient: [name]           Date of Birth: 1941-03-28           MRN: [id number] Visit Date: 06/27/2022              Requested by: Lucianne Lei, MD [address] [place],  [place] [address] PCP: Lucianne Lei, MD  Chief Complaint  Patient presents with   Lower Back - Pain      HPI: [name] is a pleasant 82 year old woman with a history of low back pain for about 10 days now.  She denies any injury though admits over the holidays she was doing quite a bit of work for caring for her son to come home.  She denies any fever chills any paresthesias in her arms or hands or loss of strength.  She has treated this with over-the-counter ibuprofen.  She has had this in the past as well as pain in both her cervical and lower back..  She says she has difficulty wearing her bra because where the bra strap hits against the back it hurts.  She said this is throughout her back not just 1 specific area  Assessment & Plan: Visit Diagnoses:  1. Low back pain, unspecified back pain laterality, unspecified chronicity, unspecified whether sciatica present     Plan: She has a significant amount of arthritis in her thoracic spine.  Compared to films of 2019 similar but may have progressed a bit more.  She is neurologically intact.  She is comfortable has no shortness of breath.  We talked about the natural history of this.  I will call her in some Mobic small amount and I told her I would call her in a very small amount of tramadol just to take when it gets severe.  Would like her to work with physical therapy she will follow-up in 1 month  Follow-Up Instructions: Return in about 1 month (around 07/28/2022).   Ortho Exam  Patient is alert, oriented, no adenopathy, well-dressed, normal affect, normal respiratory effort. Examination patient has no shortness of breath she appears comfortable sitting in the chair.  She has no warmth or tenderness no erythema about her back.  She is  focally tender throughout her lower back from her cervical spine to her lower back.  She has excellent upper and lower body strength.  No paresthesias are present.  Imaging: No results found. No images are attached to the encounter.  Labs: No results found for: "HGBA1C", "ESRSEDRATE", "CRP", "LABURIC", "REPTSTATUS", "GRAMSTAIN", "CULT", "LABORGA"   No results found for: "ALBUMIN", "PREALBUMIN", "CBC"  No results found for: "MG" No results found for: "VD25OH"  No results found for: "PREALBUMIN"    Latest Ref Rng & Units 11/09/2005    1:23 PM  CBC EXTENDED  WBC 3.9 - 10.0 10e3/uL 2.5   RBC 3.70 - 5.32 10e6/uL 4.61   Hemoglobin 11.6 - 15.9 g/dL 14.1   HCT 34.8 - 46.6 % 41.8   Platelets 145 - 400 10e3/uL 174   NEUT# 1.5 - 6.5 10e3/uL 0.8   Lymph# 0.9 - 3.3 10e3/uL 1.4      There is no height or weight on file to calculate BMI.  Orders:  Orders Placed This Encounter  Procedures   XR Thoracic Spine 2 View   Ambulatory referral to Physical Therapy   Meds ordered this encounter  Medications   meloxicam (MOBIC) 7.5 MG tablet    Sig: Take  1 tablet (7.5 mg total) by mouth daily.    Dispense:  20 tablet    Refill:  0     Procedures: No procedures performed  Clinical Data: No additional findings.  ROS:  All other systems negative, except as noted in the HPI. Review of Systems  Objective: Vital Signs: There were no vitals taken for this visit.  Specialty Comments:  No specialty comments available.  PMFS History: Patient Active Problem List   Diagnosis Date Noted   Arthritis of right hip 03/21/2022   Reactive airway disease 07/29/2020   Seasonal and perennial allergic rhinitis 07/29/2020   Gastroesophageal reflux disease 07/29/2020   Unilateral primary osteoarthritis, right knee 11/21/2017   Status post revision of total knee, left 11/21/2017   Chronic rhinitis 02/25/2016   Cough 02/25/2016   Hoarseness of voice 02/25/2016   Chronic leukopenia 12/15/2015    Past Medical History:  Diagnosis Date   Asthma    allergic induced   Cough    Hypertension    Rhinitis     Family History  Problem Relation Age of Onset   Other Mother        died when patient was 38 months old   Hypertension Father    Cerebral aneurysm Sister    Breast cancer Sister    Lung cancer Sister    Healthy Son    Allergic rhinitis Neg Hx    Angioedema Neg Hx    Asthma Neg Hx    Eczema Neg Hx    Immunodeficiency Neg Hx    Urticaria Neg Hx     Past Surgical History:  Procedure Laterality Date   ABDOMINAL HYSTERECTOMY     BACK SURGERY     CATARACT EXTRACTION     MASS EXCISION  01/05/2012   Procedure: MINOR EXCISION OF MASS;  Surgeon: Cammie Sickle., MD;  Location: [facility];  Service: Orthopedics;  Laterality: Right;  excision mucoid cyst right long finger, debride DIP joint   ROTATOR CUFF REPAIR Bilateral    TOTAL KNEE ARTHROPLASTY Left    twice   Social History   Occupational History   Occupation: retired    Comment: Warehouse manager  Tobacco Use   Smoking status: Never   Smokeless tobacco: Never  Vaping Use   Vaping Use: Never used  Substance and Sexual Activity   Alcohol use: No    Alcohol/week: 0.0 standard drinks of alcohol   Drug use: No   Sexual activity: Not on file    Comment: retired from [employer]. 1 son.

## 2022-07-05 ENCOUNTER — Encounter: Payer: Self-pay | Admitting: Physical Therapy

## 2022-07-05 ENCOUNTER — Ambulatory Visit (INDEPENDENT_AMBULATORY_CARE_PROVIDER_SITE_OTHER): Payer: Medicare Other | Admitting: Physical Therapy

## 2022-07-05 ENCOUNTER — Other Ambulatory Visit: Payer: Self-pay

## 2022-07-05 DIAGNOSIS — M546 Pain in thoracic spine: Secondary | ICD-10-CM | POA: Diagnosis not present

## 2022-07-05 DIAGNOSIS — M6281 Muscle weakness (generalized): Secondary | ICD-10-CM

## 2022-07-05 DIAGNOSIS — R293 Abnormal posture: Secondary | ICD-10-CM | POA: Diagnosis not present

## 2022-07-05 DIAGNOSIS — M5459 Other low back pain: Secondary | ICD-10-CM

## 2022-07-05 NOTE — Therapy (Signed)
OUTPATIENT PHYSICAL THERAPY THORACOLUMBAR EVALUATION   Patient Name: [name] MRN: [id number] DOB:[birth year], 82 y.o., female Today's Date: 07/05/2022  END OF SESSION:  PT End of Session - 07/05/22 1013     Visit Number 1    Number of Visits 12    Date for PT Re-Evaluation 08/16/22    Authorization Type Medicare/AARP    Progress Note Due on Visit 10    PT Start Time 0930    PT Stop Time 1001    PT Time Calculation (min) 31 min    Activity Tolerance Patient tolerated treatment well    Behavior During Therapy WFL for tasks assessed/performed             Past Medical History:  Diagnosis Date   Asthma    allergic induced   Cough    Hypertension    Rhinitis    Past Surgical History:  Procedure Laterality Date   ABDOMINAL HYSTERECTOMY     BACK SURGERY     CATARACT EXTRACTION     MASS EXCISION  01/05/2012   Procedure: MINOR EXCISION OF MASS;  Surgeon: Cammie Sickle., MD;  Location: [facility];  Service: Orthopedics;  Laterality: Right;  excision mucoid cyst right long finger, debride DIP joint   ROTATOR CUFF REPAIR Bilateral    TOTAL KNEE ARTHROPLASTY Left    twice   Patient Active Problem List   Diagnosis Date Noted   Arthritis of right hip 03/21/2022   Reactive airway disease 07/29/2020   Seasonal and perennial allergic rhinitis 07/29/2020   Gastroesophageal reflux disease 07/29/2020   Unilateral primary osteoarthritis, right knee 11/21/2017   Status post revision of total knee, left 11/21/2017   Chronic rhinitis 02/25/2016   Cough 02/25/2016   Hoarseness of voice 02/25/2016   Chronic leukopenia 12/15/2015    PCP: Lucianne Lei, MD  REFERRING PROVIDER: Persons, Bevely Palmer, PA  REFERRING DIAG: M54.50 (ICD-10-CM) - Low back pain, unspecified back pain laterality, unspecified chronicity, unspecified whether sciatica present  Rationale for Evaluation and Treatment: Rehabilitation  THERAPY DIAG:  Abnormal posture - Plan: PT plan of  care cert/re-cert  Muscle weakness (generalized) - Plan: PT plan of care cert/re-cert  Other low back pain - Plan: PT plan of care cert/re-cert  Pain in thoracic spine - Plan: PT plan of care cert/re-cert  ONSET DATE: 1-2 months  SUBJECTIVE:                                                                                                                                                                                           SUBJECTIVE STATEMENT: Pt reports acute exacerbation  of chronic back pain.  It has recently flared up and pain is "excruciating."  Prolonged standing seems to aggravate it, which she was doing more of over the holidays.  PERTINENT HISTORY:  HTN, OA, LT TKA  PAIN:  Are you having pain? Yes: NPRS scale: 5, up to 10; at best 5/10 Pain location: low back, radiates up to bra line Pain description: sharp, shooting, "fire" Aggravating factors: increased standing activities Relieving factors: medication (meloxicam and tramadol)  PRECAUTIONS: None  WEIGHT BEARING RESTRICTIONS: No  FALLS:  Has patient fallen in last 6 months? No  LIVING ENVIRONMENT: Lives with: lives alone Lives in: House/apartment Stairs: Yes: Internal: 32 (doesn't go upstairs often)  steps; on right going up and External: 1 steps; none  OCCUPATION: Retired - [place]: Independent and Leisure: limited currently  PATIENT GOALS: improve pain  NEXT MD VISIT: 07/28/22  OBJECTIVE:   DIAGNOSTIC FINDINGS:  arthritis in the pubic symphysis and sacroiliac joints. She also has severe L4-5 and L5-S1 degenerative disc and endplate changes; mild to moderate hip arthritis; She has a significant amount of arthritis in her thoracic spine  PATIENT SURVEYS:  07/05/22:FOTO 53 (predicted 65)  SCREENING FOR RED FLAGS: Bowel or bladder incontinence: No Spinal tumors: No Cauda equina syndrome: No Compression fracture: No Abdominal aneurysm: No  COGNITION: Overall cognitive  status: Within functional limits for tasks assessed     SENSATION: 07/05/22: WFL  POSTURE: rounded shoulders and forward head  PALPATION: 07/05/22: tenderness and hypomobility noted throughout mid/lower thoracic spine  LUMBAR ROM:   AROM eval  Flexion WNL  Extension Limited 25% with pain  Right Quadrant WNL with pain  Left Quadrant WNL with pain   (Blank rows = not tested)  LOWER EXTREMITY ROM:  Lt knee approx 0-45 deg of motion (not formally tested); Rt knee only able to flex to approx 90 deg  LOWER EXTREMITY MMT:    MMT Right eval Left eval  Hip flexion 3/5 3/5  Hip extension 3/5 3/5  Hip abduction    Hip adduction    Hip internal rotation    Hip external rotation    Knee flexion    Knee extension 5/5 5/5  Ankle dorsiflexion    Ankle plantarflexion    Ankle inversion    Ankle eversion     (Blank rows = not tested)  LUMBAR SPECIAL TESTS:  07/05/22: Slump test: Negative  GAIT: Distance walked: 100' Assistive device utilized: Single point cane Level of assistance: Modified independence Comments: decreased Lt hip/knee flexion  TODAY'S TREATMENT:                                                                                                                              DATE: 07/05/22    PATIENT EDUCATION:  Education details: HEP Person educated: Patient Education method: Consulting civil engineer, Media planner, and Handouts Education comprehension: verbalized understanding, returned demonstration, and needs further education  HOME EXERCISE PROGRAM: Access Code: [email or web address]  URL: [email or web address] Date: 07/05/2022 Prepared by: Faustino Congress  Exercises - Prone Hip Extension  - 1-2 x daily - 7 x weekly - 1-2 sets - 10 reps - 3 sec hold - Sidelying Thoracic Rotation with Open Book  - 1-2 x daily - 7 x weekly - 1-2 sets - 10 reps - 5-10 sec hold - Seated Flexion Stretch with Swiss Ball  - 1-2 x daily - 7 x weekly - 1 sets - 10 reps - 5-10 hold - Seated  Scapular Retraction  - 1-2 x daily - 7 x weekly - 1 sets - 10 reps - 5 sec hold  ASSESSMENT:  CLINICAL IMPRESSION: Patient is a 82 y.o. female who was seen today for physical therapy evaluation and treatment for mid back pain.  She demonstrates decreased strength and ROM as well as postural abnormalities affecting functional mobility..  She will benefit from PT to address deficits listed.   OBJECTIVE IMPAIRMENTS: Abnormal gait, decreased activity tolerance, decreased knowledge of use of DME, decreased mobility, decreased ROM, decreased strength, hypomobility, increased fascial restrictions, increased muscle spasms, postural dysfunction, and pain.   ACTIVITY LIMITATIONS: carrying, lifting, bending, sitting, standing, stairs, transfers, and locomotion level  PARTICIPATION LIMITATIONS: meal prep, cleaning, laundry, shopping, and community activity  PERSONAL FACTORS: 3+ comorbidities: HTN, OA, LT TKA  are also affecting patient's functional outcome.   REHAB POTENTIAL: Good  CLINICAL DECISION MAKING: Evolving/moderate complexity  EVALUATION COMPLEXITY: Moderate   GOALS: Goals reviewed with patient? Yes  SHORT TERM GOALS: Target date: 07/26/2022  Independent with initial HEP Goal status: INITIAL   LONG TERM GOALS: Target date: 08/16/2022  Independent with final HEP Goal status: INITIAL  2.  FOTO score improved to 65 Goal status: INITIAL  3.  Perform thoracolumbar AROM without increase in pain for improved function Goal status: INIITAL  4.  Report pain < 2/10 with standing > 30 min for improved function Goal status: INITIAL    PLAN:  PT FREQUENCY: 1-2x/week  PT DURATION: 6 weeks  PLANNED INTERVENTIONS: Therapeutic exercises, Therapeutic activity, Neuromuscular re-education, Balance training, Gait training, Patient/Family education, Self Care, Joint mobilization, Stair training, DME instructions, Aquatic Therapy, Dry Needling, Electrical stimulation, Spinal manipulation,  Spinal mobilization, Cryotherapy, Moist heat, Taping, Ultrasound, Ionotophoresis '4mg'$ /ml Dexamethasone, Manual therapy, and Re-evaluation.  PLAN FOR NEXT SESSION: review HEP, progress strengthening and flexibility as able   Laureen Abrahams, PT, DPT 07/05/22 10:17 AM

## 2022-07-12 ENCOUNTER — Ambulatory Visit (INDEPENDENT_AMBULATORY_CARE_PROVIDER_SITE_OTHER): Payer: Medicare Other | Admitting: Physical Therapy

## 2022-07-12 ENCOUNTER — Encounter: Payer: Self-pay | Admitting: Physical Therapy

## 2022-07-12 DIAGNOSIS — M546 Pain in thoracic spine: Secondary | ICD-10-CM

## 2022-07-12 DIAGNOSIS — R293 Abnormal posture: Secondary | ICD-10-CM | POA: Diagnosis not present

## 2022-07-12 DIAGNOSIS — M5459 Other low back pain: Secondary | ICD-10-CM

## 2022-07-12 DIAGNOSIS — M6281 Muscle weakness (generalized): Secondary | ICD-10-CM | POA: Diagnosis not present

## 2022-07-12 NOTE — Therapy (Signed)
OUTPATIENT PHYSICAL THERAPY TREATMENT NOTE   Patient Name: NYANA HAREN MRN: [id number] DOB:08-28-1940, 82 y.o., female Today's Date: 07/12/2022  END OF SESSION:   PT End of Session - 07/12/22 0915     Visit Number 2    Number of Visits 12    Date for PT Re-Evaluation 08/16/22    Authorization Type Medicare/AARP    Progress Note Due on Visit 10    PT Start Time 0847    PT Stop Time 0927    PT Time Calculation (min) 40 min    Activity Tolerance Patient tolerated treatment well    Behavior During Therapy WFL for tasks assessed/performed             Past Medical History:  Diagnosis Date   Asthma    allergic induced   Cough    Hypertension    Rhinitis    Past Surgical History:  Procedure Laterality Date   ABDOMINAL HYSTERECTOMY     BACK SURGERY     CATARACT EXTRACTION     MASS EXCISION  01/05/2012   Procedure: MINOR EXCISION OF MASS;  Surgeon: Cammie Sickle., MD;  Location: [facility];  Service: Orthopedics;  Laterality: Right;  excision mucoid cyst right long finger, debride DIP joint   ROTATOR CUFF REPAIR Bilateral    TOTAL KNEE ARTHROPLASTY Left    twice   Patient Active Problem List   Diagnosis Date Noted   Arthritis of right hip 03/21/2022   Reactive airway disease 07/29/2020   Seasonal and perennial allergic rhinitis 07/29/2020   Gastroesophageal reflux disease 07/29/2020   Unilateral primary osteoarthritis, right knee 11/21/2017   Status post revision of total knee, left 11/21/2017   Chronic rhinitis 02/25/2016   Cough 02/25/2016   Hoarseness of voice 02/25/2016   Chronic leukopenia 12/15/2015     THERAPY DIAG:  Abnormal posture  Muscle weakness (generalized)  Other low back pain  Pain in thoracic spine   PCP: Lucianne Lei, MD  REFERRING PROVIDER: Persons, Bevely Palmer, PA  REFERRING DIAG: M54.50 (ICD-10-CM) - Low back pain, unspecified back pain laterality, unspecified chronicity, unspecified whether sciatica  present  Rationale for Evaluation and Treatment: Rehabilitation  EVAL THERAPY DIAG:  Abnormal posture - Plan: PT plan of care cert/re-cert  Muscle weakness (generalized) - Plan: PT plan of care cert/re-cert  Other low back pain - Plan: PT plan of care cert/re-cert  Pain in thoracic spine - Plan: PT plan of care cert/re-cert  ONSET DATE: 1-2 months  SUBJECTIVE:  SUBJECTIVE STATEMENT: Back feels like it's slowly getting better.    PERTINENT HISTORY:  HTN, OA, LT TKA  PAIN:  Are you having pain? Yes: NPRS scale: 4, up to 6; at best 4/10 Pain location: low back, radiates up to bra line Pain description: sharp, shooting, "fire" Aggravating factors: increased standing activities Relieving factors: medication (meloxicam and tramadol)  PRECAUTIONS: None  WEIGHT BEARING RESTRICTIONS: No  FALLS:  Has patient fallen in last 6 months? No  LIVING ENVIRONMENT: Lives with: lives alone Lives in: House/apartment Stairs: Yes: Internal: 72 (doesn't go upstairs often)  steps; on right going up and External: 1 steps; none  OCCUPATION: Retired - [place]: Independent and Leisure: limited currently  PATIENT GOALS: improve pain  NEXT MD VISIT: 07/28/22  OBJECTIVE:   DIAGNOSTIC FINDINGS:  arthritis in the pubic symphysis and sacroiliac joints. She also has severe L4-5 and L5-S1 degenerative disc and endplate changes; mild to moderate hip arthritis; She has a significant amount of arthritis in her thoracic spine  PATIENT SURVEYS:  07/05/22:FOTO 53 (predicted 65)  SCREENING FOR RED FLAGS: Bowel or bladder incontinence: No Spinal tumors: No Cauda equina syndrome: No Compression fracture: No Abdominal aneurysm: No  COGNITION: Overall cognitive status: Within functional  limits for tasks assessed     SENSATION: 07/05/22: WFL  POSTURE: rounded shoulders and forward head  PALPATION: 07/05/22: tenderness and hypomobility noted throughout mid/lower thoracic spine  LUMBAR ROM:   AROM eval  Flexion WNL  Extension Limited 25% with pain  Right Quadrant WNL with pain  Left Quadrant WNL with pain   (Blank rows = not tested)  LOWER EXTREMITY ROM:  Lt knee approx 0-45 deg of motion (not formally tested); Rt knee only able to flex to approx 90 deg  LOWER EXTREMITY MMT:    MMT Right eval Left eval  Hip flexion 3/5 3/5  Hip extension 3/5 3/5  Hip abduction    Hip adduction    Hip internal rotation    Hip external rotation    Knee flexion    Knee extension 5/5 5/5  Ankle dorsiflexion    Ankle plantarflexion    Ankle inversion    Ankle eversion     (Blank rows = not tested)  LUMBAR SPECIAL TESTS:  07/05/22: Slump test: Negative  GAIT: Distance walked: 100' Assistive device utilized: Single point cane Level of assistance: Modified independence Comments: decreased Lt hip/knee flexion  TODAY'S TREATMENT:                                                                                                                              DATE: 07/05/22  TherEx Seated scapular retraction 10 reps; 5 sec hold Seated back flexion stretch with green pball Lt/mid/Rt 10 x 10 sec holds Sidelying book openers 10 x 5-10 sec hold Prone hip extension x10 reps bil; 3 sec hold Sit to/from stand x 10 reps with intermittent UE support x 10 reps  PATIENT EDUCATION:  Education details: HEP Person educated: Patient Education method: Consulting civil engineer, Media planner, and Handouts Education comprehension: verbalized understanding, returned demonstration, and needs further education  HOME EXERCISE PROGRAM: Access Code: [email or web address] URL: [email or web address] Date: 07/05/2022 Prepared by: Faustino Congress  Exercises - Prone Hip Extension  - 1-2 x daily - 7 x  weekly - 1-2 sets - 10 reps - 3 sec hold - Sidelying Thoracic Rotation with Open Book  - 1-2 x daily - 7 x weekly - 1-2 sets - 10 reps - 5-10 sec hold - Seated Flexion Stretch with Swiss Ball  - 1-2 x daily - 7 x weekly - 1 sets - 10 reps - 5-10 hold - Seated Scapular Retraction  - 1-2 x daily - 7 x weekly - 1 sets - 10 reps - 5 sec hold  ASSESSMENT:  CLINICAL IMPRESSION: Session today focused on review of HEP with pt demonstrating good understanding.  Pain improving overall so did not add additional exercises today.  Will continue to benefit from PT to maximize function.  OBJECTIVE IMPAIRMENTS: Abnormal gait, decreased activity tolerance, decreased knowledge of use of DME, decreased mobility, decreased ROM, decreased strength, hypomobility, increased fascial restrictions, increased muscle spasms, postural dysfunction, and pain.   ACTIVITY LIMITATIONS: carrying, lifting, bending, sitting, standing, stairs, transfers, and locomotion level  PARTICIPATION LIMITATIONS: meal prep, cleaning, laundry, shopping, and community activity  PERSONAL FACTORS: 3+ comorbidities: HTN, OA, LT TKA are also affecting patient's functional outcome.   REHAB POTENTIAL: Good  CLINICAL DECISION MAKING: Evolving/moderate complexity  EVALUATION COMPLEXITY: Moderate   GOALS: Goals reviewed with patient? Yes  SHORT TERM GOALS: Target date: 07/26/2022  Independent with initial HEP Goal status: INITIAL   LONG TERM GOALS: Target date: 08/16/2022  Independent with final HEP Goal status: INITIAL  2.  FOTO score improved to 65 Goal status: INITIAL  3.  Perform thoracolumbar AROM without increase in pain for improved function Goal status: INIITAL  4.  Report pain < 2/10 with standing > 30 min for improved function Goal status: INITIAL    PLAN:  PT FREQUENCY: 1-2x/week  PT DURATION: 6 weeks  PLANNED INTERVENTIONS: Therapeutic exercises, Therapeutic activity, Neuromuscular re-education, Balance  training, Gait training, Patient/Family education, Self Care, Joint mobilization, Stair training, DME instructions, Aquatic Therapy, Dry Needling, Electrical stimulation, Spinal manipulation, Spinal mobilization, Cryotherapy, Moist heat, Taping, Ultrasound, Ionotophoresis '4mg'$ /ml Dexamethasone, Manual therapy, and Re-evaluation.  PLAN FOR NEXT SESSION: review HEP PRN, progress strengthening and flexibility as able    Laureen Abrahams, PT, DPT 07/12/22 9:27 AM

## 2022-07-14 NOTE — Therapy (Signed)
OUTPATIENT PHYSICAL THERAPY TREATMENT NOTE   Patient Name: SONTEE DESENA MRN: [id number] DOB:1941-02-05, 82 y.o., female Today's Date: 07/15/2022  END OF SESSION:   PT End of Session - 07/15/22 0838     Visit Number 3    Number of Visits 12    Date for PT Re-Evaluation 08/16/22    Authorization Type Medicare/AARP    Progress Note Due on Visit 10    PT Start Time 0840    PT Stop Time 0921    PT Time Calculation (min) 41 min    Activity Tolerance Patient tolerated treatment well    Behavior During Therapy WFL for tasks assessed/performed              Past Medical History:  Diagnosis Date   Asthma    allergic induced   Cough    Hypertension    Rhinitis    Past Surgical History:  Procedure Laterality Date   ABDOMINAL HYSTERECTOMY     BACK SURGERY     CATARACT EXTRACTION     MASS EXCISION  01/05/2012   Procedure: MINOR EXCISION OF MASS;  Surgeon: Cammie Sickle., MD;  Location: [place];  Service: Orthopedics;  Laterality: Right;  excision mucoid cyst right long finger, debride DIP joint   ROTATOR CUFF REPAIR Bilateral    TOTAL KNEE ARTHROPLASTY Left    twice   Patient Active Problem List   Diagnosis Date Noted   Arthritis of right hip 03/21/2022   Reactive airway disease 07/29/2020   Seasonal and perennial allergic rhinitis 07/29/2020   Gastroesophageal reflux disease 07/29/2020   Unilateral primary osteoarthritis, right knee 11/21/2017   Status post revision of total knee, left 11/21/2017   Chronic rhinitis 02/25/2016   Cough 02/25/2016   Hoarseness of voice 02/25/2016   Chronic leukopenia 12/15/2015     THERAPY DIAG:  Abnormal posture  Muscle weakness (generalized)  Other low back pain  Pain in thoracic spine   PCP: Lucianne Lei, MD  REFERRING PROVIDER: Persons, Bevely Palmer, PA  REFERRING DIAG: M54.50 (ICD-10-CM) - Low back pain, unspecified back pain laterality, unspecified chronicity, unspecified whether sciatica  present  Rationale for Evaluation and Treatment: Rehabilitation  EVAL THERAPY DIAG:  Abnormal posture - Plan: PT plan of care cert/re-cert  Muscle weakness (generalized) - Plan: PT plan of care cert/re-cert  Other low back pain - Plan: PT plan of care cert/re-cert  Pain in thoracic spine - Plan: PT plan of care cert/re-cert  ONSET DATE: 1-2 months  SUBJECTIVE:  SUBJECTIVE STATEMENT: Back is doing "very well."  Standing for 10-15 min (washing dishes or prepping food) will start to flare back up  PERTINENT HISTORY:  HTN, OA, LT TKA  PAIN:  Are you having pain? Yes: NPRS scale: 4, up to 6; at best 4/10 Pain location: low back, radiates up to bra line Pain description: sharp, shooting, "fire" Aggravating factors: increased standing activities Relieving factors: medication (meloxicam and tramadol)  PRECAUTIONS: None  WEIGHT BEARING RESTRICTIONS: No  FALLS:  Has patient fallen in last 6 months? No  LIVING ENVIRONMENT: Lives with: lives alone Lives in: House/apartment Stairs: Yes: Internal: 51 (doesn't go upstairs often)  steps; on right going up and External: 1 steps; none  OCCUPATION: Retired - [place]: Independent and Leisure: limited currently  PATIENT GOALS: improve pain  NEXT MD VISIT: 07/28/22  OBJECTIVE:   DIAGNOSTIC FINDINGS:  arthritis in the pubic symphysis and sacroiliac joints. She also has severe L4-5 and L5-S1 degenerative disc and endplate changes; mild to moderate hip arthritis; She has a significant amount of arthritis in her thoracic spine  PATIENT SURVEYS:  07/05/22:FOTO 53 (predicted 65)  SCREENING FOR RED FLAGS: Bowel or bladder incontinence: No Spinal tumors: No Cauda equina syndrome: No Compression fracture: No Abdominal  aneurysm: No  COGNITION: Overall cognitive status: Within functional limits for tasks assessed     SENSATION: 07/05/22: WFL  POSTURE: rounded shoulders and forward head  PALPATION: 07/05/22: tenderness and hypomobility noted throughout mid/lower thoracic spine  LUMBAR ROM:   AROM eval  Flexion WNL  Extension Limited 25% with pain  Right Quadrant WNL with pain  Left Quadrant WNL with pain   (Blank rows = not tested)  LOWER EXTREMITY ROM:  Lt knee approx 0-45 deg of motion (not formally tested); Rt knee only able to flex to approx 90 deg  LOWER EXTREMITY MMT:    MMT Right eval Left eval  Hip flexion 3/5 3/5  Hip extension 3/5 3/5  Hip abduction    Hip adduction    Hip internal rotation    Hip external rotation    Knee flexion    Knee extension 5/5 5/5  Ankle dorsiflexion    Ankle plantarflexion    Ankle inversion    Ankle eversion     (Blank rows = not tested)  LUMBAR SPECIAL TESTS:  07/05/22: Slump test: Negative  GAIT: Distance walked: 100' Assistive device utilized: Single point cane Level of assistance: Modified independence Comments: decreased Lt hip/knee flexion  TODAY'S TREATMENT:                                                                                                                              DATE:  07/15/22 TherEx Standing hip abduction x 20 reps bil Standing hip extension x 20 reps bil Bent over row 5# bil; 2x10 Bent over flys 3# bil; 2x10 Squats 2x10  TherAct Discussed ways to modify yardwork due to limited knee flexion  on Lt and to minimize risk of injury to back.  07/05/22  TherEx Seated scapular retraction 10 reps; 5 sec hold Seated back flexion stretch with green pball Lt/mid/Rt 10 x 10 sec holds Sidelying book openers 10 x 5-10 sec hold Prone hip extension x10 reps bil; 3 sec hold Sit to/from stand x 10 reps with intermittent UE support x 10 reps   PATIENT EDUCATION:  Education details: HEP Person educated:  Patient Education method: Consulting civil engineer, Media planner, and Handouts Education comprehension: verbalized understanding, returned demonstration, and needs further education  HOME EXERCISE PROGRAM: Access Code: [email or web address] URL: [email or web address] Date: 07/15/2022 Prepared by: Faustino Congress  Exercises - Prone Hip Extension  - 1-2 x daily - 7 x weekly - 1-2 sets - 10 reps - 3 sec hold - Sidelying Thoracic Rotation with Open Book  - 1-2 x daily - 7 x weekly - 1-2 sets - 10 reps - 5-10 sec hold - Seated Flexion Stretch with Swiss Ball  - 1-2 x daily - 7 x weekly - 1 sets - 10 reps - 5-10 hold - Seated Scapular Retraction  - 1-2 x daily - 7 x weekly - 1 sets - 10 reps - 5 sec hold - Standing Hip Abduction with Counter Support  - 1 x daily - 7 x weekly - 1 sets - 20 reps - Standing Hip Extension with Counter Support  - 1 x daily - 7 x weekly - 1 sets - 20 reps - Standing Bent Over Bilateral Shoulder Row with Dumbbells  - 1 x daily - 7 x weekly - 2 sets - 10 reps - Single Arm Bent Over Shoulder Horizontal Abduction with Dumbbell - Palm Down  - 1 x daily - 7 x weekly - 2 sets - 10 reps - Mini Squat with Counter Support  - 1 x daily - 7 x weekly - 2 sets - 10 reps  ASSESSMENT:  CLINICAL IMPRESSION: Pt tolerated session well today adding standing strengthening exercises to HEP today.  Will continue to benefit from PT to maximize function, anticipate if she continues to do well will be able to d/c early.  OBJECTIVE IMPAIRMENTS: Abnormal gait, decreased activity tolerance, decreased knowledge of use of DME, decreased mobility, decreased ROM, decreased strength, hypomobility, increased fascial restrictions, increased muscle spasms, postural dysfunction, and pain.   ACTIVITY LIMITATIONS: carrying, lifting, bending, sitting, standing, stairs, transfers, and locomotion level  PARTICIPATION LIMITATIONS: meal prep, cleaning, laundry, shopping, and community activity  PERSONAL FACTORS: 3+  comorbidities: HTN, OA, LT TKA are also affecting patient's functional outcome.   REHAB POTENTIAL: Good  CLINICAL DECISION MAKING: Evolving/moderate complexity  EVALUATION COMPLEXITY: Moderate   GOALS: Goals reviewed with patient? Yes  SHORT TERM GOALS: Target date: 07/26/2022  Independent with initial HEP Goal status: INITIAL   LONG TERM GOALS: Target date: 08/16/2022  Independent with final HEP Goal status: INITIAL  2.  FOTO score improved to 65 Goal status: INITIAL  3.  Perform thoracolumbar AROM without increase in pain for improved function Goal status: INIITAL  4.  Report pain < 2/10 with standing > 30 min for improved function Goal status: INITIAL    PLAN:  PT FREQUENCY: 1-2x/week  PT DURATION: 6 weeks  PLANNED INTERVENTIONS: Therapeutic exercises, Therapeutic activity, Neuromuscular re-education, Balance training, Gait training, Patient/Family education, Self Care, Joint mobilization, Stair training, DME instructions, Aquatic Therapy, Dry Needling, Electrical stimulation, Spinal manipulation, Spinal mobilization, Cryotherapy, Moist heat, Taping, Ultrasound, Ionotophoresis '4mg'$ /ml Dexamethasone, Manual therapy, and Re-evaluation.  PLAN FOR NEXT SESSION: review updated  HEP PRN, progress strengthening and flexibility as able; work on ways to safely perform yardwork    Laureen Abrahams, PT, DPT 07/15/22 9:24 AM

## 2022-07-15 ENCOUNTER — Ambulatory Visit (INDEPENDENT_AMBULATORY_CARE_PROVIDER_SITE_OTHER): Payer: Medicare Other | Admitting: Physical Therapy

## 2022-07-15 ENCOUNTER — Encounter: Payer: Self-pay | Admitting: Physical Therapy

## 2022-07-15 DIAGNOSIS — M6281 Muscle weakness (generalized): Secondary | ICD-10-CM | POA: Diagnosis not present

## 2022-07-15 DIAGNOSIS — M5459 Other low back pain: Secondary | ICD-10-CM

## 2022-07-15 DIAGNOSIS — R293 Abnormal posture: Secondary | ICD-10-CM

## 2022-07-15 DIAGNOSIS — M546 Pain in thoracic spine: Secondary | ICD-10-CM

## 2022-07-18 ENCOUNTER — Encounter: Payer: Self-pay | Admitting: Physical Therapy

## 2022-07-18 ENCOUNTER — Ambulatory Visit (INDEPENDENT_AMBULATORY_CARE_PROVIDER_SITE_OTHER): Payer: Medicare Other | Admitting: Physical Therapy

## 2022-07-18 DIAGNOSIS — R293 Abnormal posture: Secondary | ICD-10-CM | POA: Diagnosis not present

## 2022-07-18 DIAGNOSIS — M546 Pain in thoracic spine: Secondary | ICD-10-CM

## 2022-07-18 DIAGNOSIS — M6281 Muscle weakness (generalized): Secondary | ICD-10-CM | POA: Diagnosis not present

## 2022-07-18 DIAGNOSIS — M5459 Other low back pain: Secondary | ICD-10-CM | POA: Diagnosis not present

## 2022-07-18 NOTE — Therapy (Addendum)
OUTPATIENT PHYSICAL THERAPY TREATMENT NOTE DISCHARGE SUMMARY   Patient Name: [name] MRN: [id number] DOB:1941-04-10, 82 y.o., female Today's Date: 07/18/2022  END OF SESSION:   PT End of Session - 07/18/22 0933     Visit Number 4    Number of Visits 12    Date for PT Re-Evaluation 08/16/22    Authorization Type Medicare/AARP    Progress Note Due on Visit 10    PT Start Time 0930    PT Stop Time 1010    PT Time Calculation (min) 40 min    Activity Tolerance Patient tolerated treatment well    Behavior During Therapy WFL for tasks assessed/performed               Past Medical History:  Diagnosis Date   Asthma    allergic induced   Cough    Hypertension    Rhinitis    Past Surgical History:  Procedure Laterality Date   ABDOMINAL HYSTERECTOMY     BACK SURGERY     CATARACT EXTRACTION     MASS EXCISION  01/05/2012   Procedure: MINOR EXCISION OF MASS;  Surgeon: Cammie Sickle., MD;  Location: [place];  Service: Orthopedics;  Laterality: Right;  excision mucoid cyst right long finger, debride DIP joint   ROTATOR CUFF REPAIR Bilateral    TOTAL KNEE ARTHROPLASTY Left    twice   Patient Active Problem List   Diagnosis Date Noted   Arthritis of right hip 03/21/2022   Reactive airway disease 07/29/2020   Seasonal and perennial allergic rhinitis 07/29/2020   Gastroesophageal reflux disease 07/29/2020   Unilateral primary osteoarthritis, right knee 11/21/2017   Status post revision of total knee, left 11/21/2017   Chronic rhinitis 02/25/2016   Cough 02/25/2016   Hoarseness of voice 02/25/2016   Chronic leukopenia 12/15/2015     THERAPY DIAG:  Abnormal posture  Muscle weakness (generalized)  Other low back pain  Pain in thoracic spine   PCP: Lucianne Lei, MD  REFERRING PROVIDER: Persons, Bevely Palmer, PA  REFERRING DIAG: M54.50 (ICD-10-CM) - Low back pain, unspecified back pain laterality, unspecified chronicity, unspecified  whether sciatica present  Rationale for Evaluation and Treatment: Rehabilitation  EVAL THERAPY DIAG:  Abnormal posture - Plan: PT plan of care cert/re-cert  Muscle weakness (generalized) - Plan: PT plan of care cert/re-cert  Other low back pain - Plan: PT plan of care cert/re-cert  Pain in thoracic spine - Plan: PT plan of care cert/re-cert  ONSET DATE: 1-2 months  SUBJECTIVE:  SUBJECTIVE STATEMENT: Back is doing well, pain is minimal  PERTINENT HISTORY:  HTN, OA, LT TKA  PAIN:  Are you having pain? Yes: NPRS scale: 4, up to 6; at best 4/10 Pain location: low back, radiates up to bra line Pain description: sharp, shooting, "fire" Aggravating factors: increased standing activities Relieving factors: medication (meloxicam and tramadol)  PRECAUTIONS: None  WEIGHT BEARING RESTRICTIONS: No  FALLS:  Has patient fallen in last 6 months? No  LIVING ENVIRONMENT: Lives with: lives alone Lives in: House/apartment Stairs: Yes: Internal: 42 (doesn't go upstairs often)  steps; on right going up and External: 1 steps; none  OCCUPATION: Retired - [place]: Independent and Leisure: limited currently  PATIENT GOALS: improve pain  NEXT MD VISIT: 07/28/22  OBJECTIVE:   DIAGNOSTIC FINDINGS:  arthritis in the pubic symphysis and sacroiliac joints. She also has severe L4-5 and L5-S1 degenerative disc and endplate changes; mild to moderate hip arthritis; She has a significant amount of arthritis in her thoracic spine  PATIENT SURVEYS:  07/05/22:FOTO 53 (predicted 65) 07/18/22: FOTO 53  SCREENING FOR RED FLAGS: Bowel or bladder incontinence: No Spinal tumors: No Cauda equina syndrome: No Compression fracture: No Abdominal aneurysm: No  COGNITION: Overall cognitive  status: Within functional limits for tasks assessed     SENSATION: 07/05/22: WFL  POSTURE: rounded shoulders and forward head  PALPATION: 07/05/22: tenderness and hypomobility noted throughout mid/lower thoracic spine  LUMBAR ROM:   AROM eval 07/18/22  Flexion WNL WNL  Extension Limited 25% with pain WNL  Right Quadrant WNL with pain WNL  Left Quadrant WNL with pain WNL (rt side pulling)   (Blank rows = not tested)  LOWER EXTREMITY ROM:  Lt knee approx 0-45 deg of motion (not formally tested); Rt knee only able to flex to approx 90 deg  LOWER EXTREMITY MMT:    MMT Right eval Left eval  Hip flexion 3/5 3/5  Hip extension 3/5 3/5  Hip abduction    Hip adduction    Hip internal rotation    Hip external rotation    Knee flexion    Knee extension 5/5 5/5  Ankle dorsiflexion    Ankle plantarflexion    Ankle inversion    Ankle eversion     (Blank rows = not tested)  LUMBAR SPECIAL TESTS:  07/05/22: Slump test: Negative  GAIT: Distance walked: 100' Assistive device utilized: Single point cane Level of assistance: Modified independence Comments: decreased Lt hip/knee flexion  TODAY'S TREATMENT:                                                                                                                              DATE:  TherEx Standing hip abduction x 20 reps bil Standing hip extension x 20 reps bil Bent over row 5# bil; 2x10 Bent over flys 3# bil; 2x10 Squats 2x10 ROM measurements - see above Discussed continued strengthening exercise for endurance and strengthening.  Pt verbalized  understanding.  07/15/22 TherEx Standing hip abduction x 20 reps bil Standing hip extension x 20 reps bil Bent over row 5# bil; 2x10 Bent over flys 3# bil; 2x10 Squats 2x10  TherAct Discussed ways to modify yardwork due to limited knee flexion on Lt and to minimize risk of injury to back.  07/05/22  TherEx Seated scapular retraction 10 reps; 5 sec hold Seated back flexion  stretch with green pball Lt/mid/Rt 10 x 10 sec holds Sidelying book openers 10 x 5-10 sec hold Prone hip extension x10 reps bil; 3 sec hold Sit to/from stand x 10 reps with intermittent UE support x 10 reps   PATIENT EDUCATION:  Education details: HEP Person educated: Patient Education method: Consulting civil engineer, Media planner, and Handouts Education comprehension: verbalized understanding, returned demonstration, and needs further education  HOME EXERCISE PROGRAM: Access Code: [email or web address] URL: [email or web address] Date: 07/15/2022 Prepared by: Faustino Congress  Exercises - Prone Hip Extension  - 1-2 x daily - 7 x weekly - 1-2 sets - 10 reps - 3 sec hold - Sidelying Thoracic Rotation with Open Book  - 1-2 x daily - 7 x weekly - 1-2 sets - 10 reps - 5-10 sec hold - Seated Flexion Stretch with Swiss Ball  - 1-2 x daily - 7 x weekly - 1 sets - 10 reps - 5-10 hold - Seated Scapular Retraction  - 1-2 x daily - 7 x weekly - 1 sets - 10 reps - 5 sec hold - Standing Hip Abduction with Counter Support  - 1 x daily - 7 x weekly - 1 sets - 20 reps - Standing Hip Extension with Counter Support  - 1 x daily - 7 x weekly - 1 sets - 20 reps - Standing Bent Over Bilateral Shoulder Row with Dumbbells  - 1 x daily - 7 x weekly - 2 sets - 10 reps - Single Arm Bent Over Shoulder Horizontal Abduction with Dumbbell - Palm Down  - 1 x daily - 7 x weekly - 2 sets - 10 reps - Mini Squat with Counter Support  - 1 x daily - 7 x weekly - 2 sets - 10 reps  ASSESSMENT:  CLINICAL IMPRESSION: Pt has met 3/4 LTGs at this time and overall has demonstrated improved functional mobility and decreased pain.  FOTO score unchanged, but likely due to back as well as knee.  Will hold PT today.  OBJECTIVE IMPAIRMENTS: Abnormal gait, decreased activity tolerance, decreased knowledge of use of DME, decreased mobility, decreased ROM, decreased strength, hypomobility, increased fascial restrictions, increased muscle  spasms, postural dysfunction, and pain.   ACTIVITY LIMITATIONS: carrying, lifting, bending, sitting, standing, stairs, transfers, and locomotion level  PARTICIPATION LIMITATIONS: meal prep, cleaning, laundry, shopping, and community activity  PERSONAL FACTORS: 3+ comorbidities: HTN, OA, LT TKA are also affecting patient's functional outcome.   REHAB POTENTIAL: Good  CLINICAL DECISION MAKING: Evolving/moderate complexity  EVALUATION COMPLEXITY: Moderate   GOALS: Goals reviewed with patient? Yes  SHORT TERM GOALS: Target date: 07/26/2022  Independent with initial HEP Goal status:  MET 07/18/22   LONG TERM GOALS: Target date: 08/16/2022  Independent with final HEP Goal status:  MET 07/18/22  2.  FOTO score improved to 65 Goal status: ONGOING 07/18/22  3.  Perform thoracolumbar AROM without increase in pain for improved function Goal status: MET 07/18/22  4.  Report pain < 2/10 with standing > 30 min for improved function Goal status:  MET 07/18/22    PLAN:  PT FREQUENCY: 1-2x/week  PT DURATION: 6  weeks  PLANNED INTERVENTIONS: Therapeutic exercises, Therapeutic activity, Neuromuscular re-education, Balance training, Gait training, Patient/Family education, Self Care, Joint mobilization, Stair training, DME instructions, Aquatic Therapy, Dry Needling, Electrical stimulation, Spinal manipulation, Spinal mobilization, Cryotherapy, Moist heat, Taping, Ultrasound, Ionotophoresis '4mg'$ /ml Dexamethasone, Manual therapy, and Re-evaluation.  PLAN FOR NEXT SESSION: hold PT x 30 days, d/c or recert based on if pt returns; review updated HEP PRN, progress strengthening and flexibility as able; work on ways to safely perform yardwork    Laureen Abrahams, PT, DPT 07/18/22 10:14 AM      PHYSICAL THERAPY DISCHARGE SUMMARY  Visits from Start of Care: 4  Current functional level related to goals / functional outcomes: SEE ABOVE   Remaining deficits: See above   Education /  Equipment: HEP   Patient agrees to discharge. Patient goals were partially met. Patient is being discharged due to being pleased with the current functional level.  Laureen Abrahams, PT, DPT 08/23/22 1:57 PM  [facility] [address] [place], [place], [address] Phone: [phone]   Fax:  [phone]

## 2022-07-19 IMAGING — CR DG CERVICAL SPINE COMPLETE 4+V
5 series · 5 of 5 positions shown · non-contrast
Comparison: 04/15/2008.

CLINICAL DATA: Neck pain that radiates to the shoulders, slightly
worse on the right. No reported injury.

EXAM:
CERVICAL SPINE - COMPLETE 4+ VIEW

[w cervical spine lat]
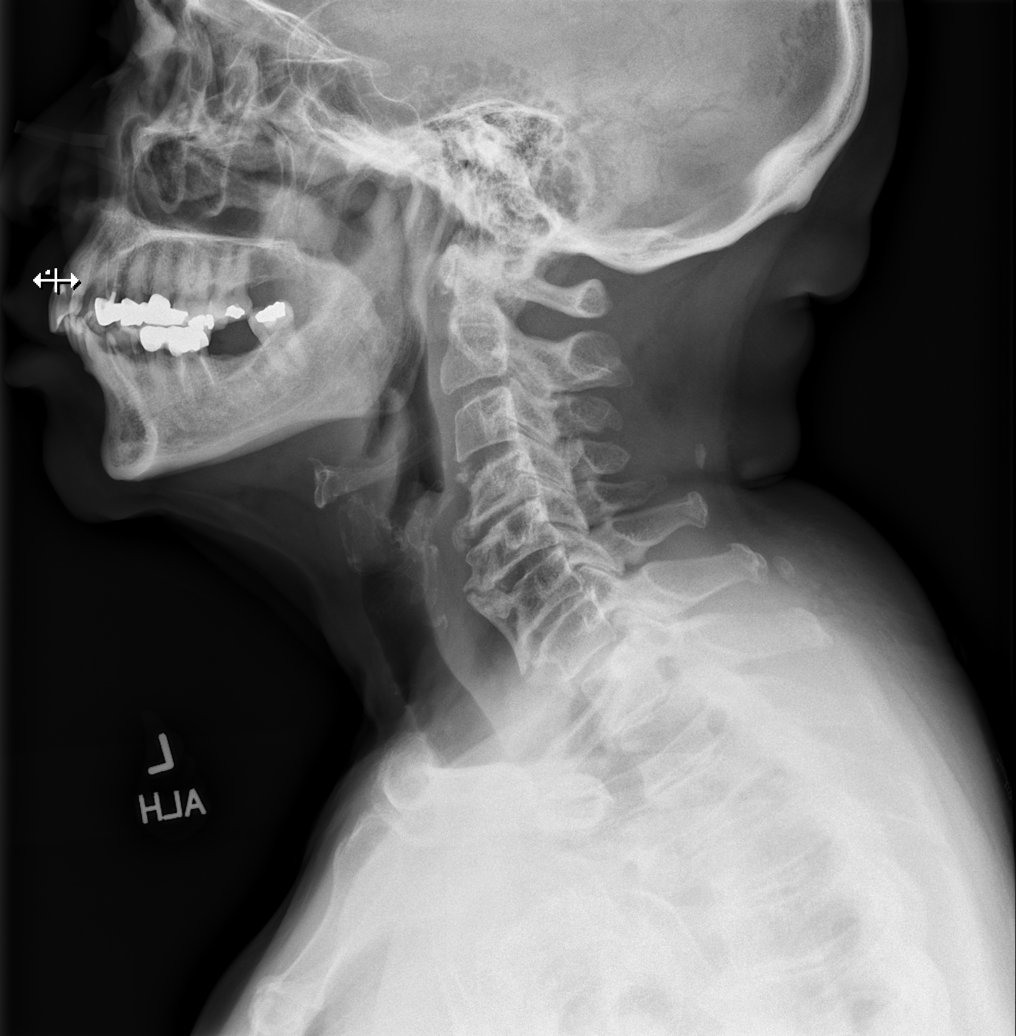

[w cervical spine ap_obl (1 of 2)]
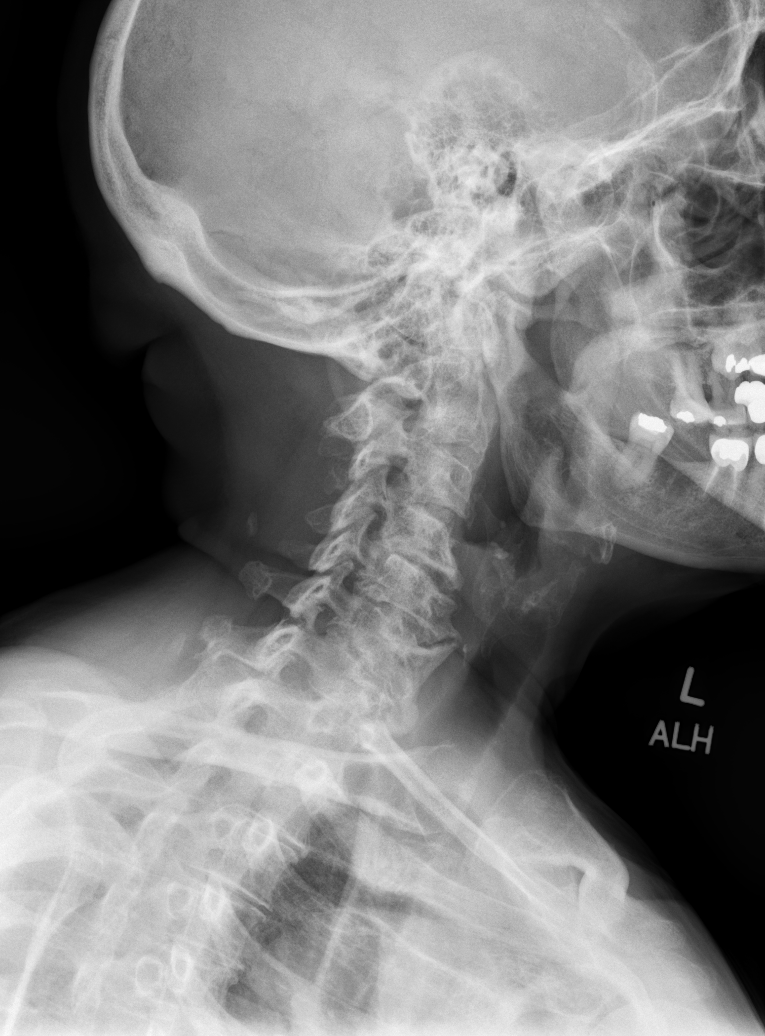

[w cervical spine ap_obl (2 of 2)]
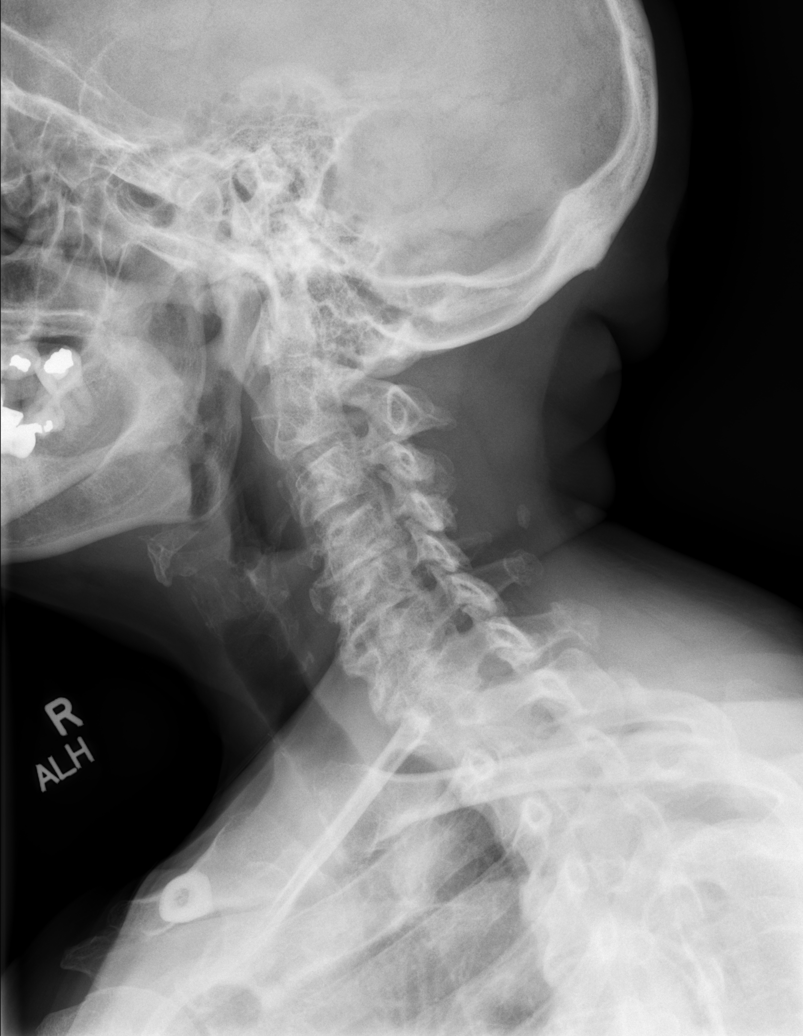

[w cervical spine ap]
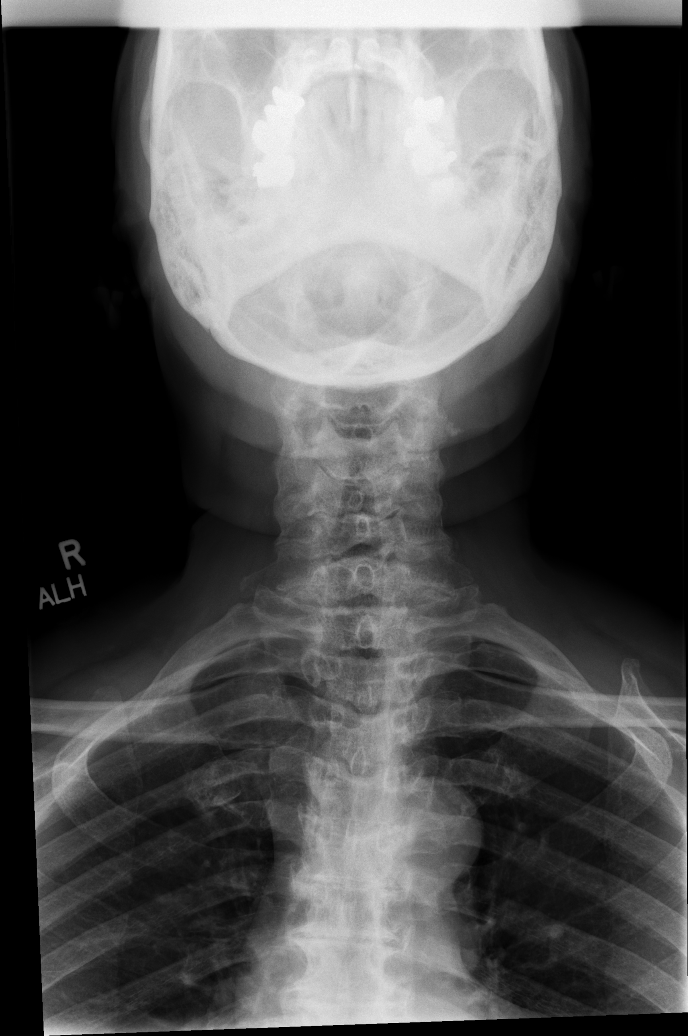

[w cervical spine odontoid]
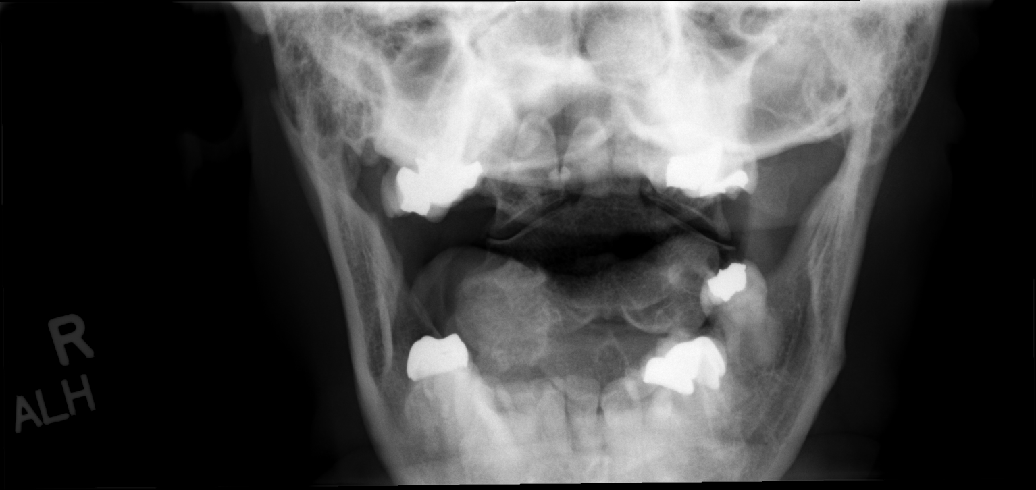

[5 of 5 positions shown; findings below may reference images not displayed]

FINDINGS: The cervical spine is visualized from the occiput to the
cervicothoracic junction. Alignment is anatomic. Vertebral body
height is maintained. Prevertebral soft tissues are within normal
limits.

Endplate degenerative changes are seen from C4-5 to C6-7, with
associated loss of disc space height, uncovertebral hypertrophy and
facet sclerosis. Facet sclerosis has progressed from 04/15/2008.
Multilevel bilateral neural foraminal narrowing. Visualized lung
apices are clear.
IMPRESSION: 1. C[DATE]-C6-7 degenerative disc disease with associated uncovertebral
hypertrophy and facet sclerosis. Facet sclerosis has progressed from
04/15/2008.
2. Multilevel bilateral neural foraminal narrowing.

## 2022-07-20 ENCOUNTER — Encounter: Payer: Medicare Other | Admitting: Physical Therapy

## 2022-07-25 ENCOUNTER — Other Ambulatory Visit: Payer: Self-pay | Admitting: Neurology

## 2022-07-25 ENCOUNTER — Encounter: Payer: Medicare Other | Admitting: Physical Therapy

## 2022-07-25 DIAGNOSIS — G250 Essential tremor: Secondary | ICD-10-CM

## 2022-07-27 ENCOUNTER — Encounter: Payer: Medicare Other | Admitting: Physical Therapy

## 2022-07-28 ENCOUNTER — Encounter: Payer: Self-pay | Admitting: Physician Assistant

## 2022-07-28 ENCOUNTER — Ambulatory Visit (INDEPENDENT_AMBULATORY_CARE_PROVIDER_SITE_OTHER): Payer: Medicare Other | Admitting: Physician Assistant

## 2022-07-28 DIAGNOSIS — M545 Low back pain, unspecified: Secondary | ICD-10-CM

## 2022-07-28 NOTE — Progress Notes (Signed)
Office Visit Note   Patient: [name]           Date of Birth: [birth year]           MRN: [id number] Visit Date: 07/28/2022              Requested by: Lucianne Lei, MD [address] [place],  [place] [id number] PCP: Lucianne Lei, MD  Chief Complaint  Patient presents with   Lower Back - Follow-up      HPI: [name] comes in today for reevaluation of her thoracic low back pain.  At her last visit she was referred to physical therapy and she reports she is doing "much better ".  Over the last couple days she has been doing the exercises on her own and when she does not exercises turning and twisting to the right it reproduces some pain along her posterior rib cage that radiates around to her anterior rib cage.  Denies any chest pain shortness of breath cough fever or chills.  Only has the pain when she turns a certain way with her exercises.  She says it does hurt to palpation.  She noted has not had any bruising any injuries or any falls  Assessment & Plan: Visit Diagnoses: Findings seem muscular in nature.  Is reproduced with certain exercises but does not have it at rest.  She has no neuropathic or radicular findings.  She is tender along the rib cage but no bruising no swelling no redness.  Findings consistent with muscle strain or little costochondritis we talked about that she could add a little Voltaren topically to this area we will recheck with me in 2 weeks   Follow-Up Instructions: Return in about 2 weeks (around 08/11/2022).   Ortho Exam  Patient is alert, oriented, no adenopathy, well-dressed, normal affect, normal respiratory effort. Patient appears well is not short of breath.  Can take deep breaths without any difficulty or pain.  She has good grip strength sensation is at her baseline.  She has no pain with movement except when she twists her body to the right which recreates the pulling sensation in her left rib cage.  No bruising but she has some tenderness to  palpation along the rib as it goes posteriorly from the thoracic spine to anteriorly.  Imaging: No results found. No images are attached to the encounter.  Labs: No results found for: "HGBA1C", "ESRSEDRATE", "CRP", "LABURIC", "REPTSTATUS", "GRAMSTAIN", "CULT", "LABORGA"   No results found for: "ALBUMIN", "PREALBUMIN", "CBC"  No results found for: "MG" No results found for: "VD25OH"  No results found for: "PREALBUMIN"    Latest Ref Rng & Units 11/09/2005    1:23 PM  CBC EXTENDED  WBC 3.9 - 10.0 10e3/uL 2.5   RBC 3.70 - 5.32 10e6/uL 4.61   Hemoglobin 11.6 - 15.9 g/dL 14.1   HCT 34.8 - 46.6 % 41.8   Platelets 145 - 400 10e3/uL 174   NEUT# 1.5 - 6.5 10e3/uL 0.8   Lymph# 0.9 - 3.3 10e3/uL 1.4      There is no height or weight on file to calculate BMI.  Orders:  No orders of the defined types were placed in this encounter.  No orders of the defined types were placed in this encounter.    Procedures: No procedures performed  Clinical Data: No additional findings.  ROS:  All other systems negative, except as noted in the HPI. Review of Systems  Objective: Vital Signs: There were no  vitals taken for this visit.  Specialty Comments:  No specialty comments available.  PMFS History: Patient Active Problem List   Diagnosis Date Noted   Arthritis of right hip 03/21/2022   Reactive airway disease 07/29/2020   Seasonal and perennial allergic rhinitis 07/29/2020   Gastroesophageal reflux disease 07/29/2020   Unilateral primary osteoarthritis, right knee 11/21/2017   Status post revision of total knee, left 11/21/2017   Chronic rhinitis 02/25/2016   Cough 02/25/2016   Hoarseness of voice 02/25/2016   Chronic leukopenia 12/15/2015   Past Medical History:  Diagnosis Date   Asthma    allergic induced   Cough    Hypertension    Rhinitis     Family History  Problem Relation Age of Onset   Other Mother        died when patient was 73 months old   Hypertension  Father    Cerebral aneurysm Sister    Breast cancer Sister    Lung cancer Sister    Healthy Son    Allergic rhinitis Neg Hx    Angioedema Neg Hx    Asthma Neg Hx    Eczema Neg Hx    Immunodeficiency Neg Hx    Urticaria Neg Hx     Past Surgical History:  Procedure Laterality Date   ABDOMINAL HYSTERECTOMY     BACK SURGERY     CATARACT EXTRACTION     MASS EXCISION  01/05/2012   Procedure: MINOR EXCISION OF MASS;  Surgeon: Cammie Sickle., MD;  Location: [facility];  Service: Orthopedics;  Laterality: Right;  excision mucoid cyst right long finger, debride DIP joint   ROTATOR CUFF REPAIR Bilateral    TOTAL KNEE ARTHROPLASTY Left    twice   Social History   Occupational History   Occupation: retired    Comment: Warehouse manager  Tobacco Use   Smoking status: Never   Smokeless tobacco: Never  Vaping Use   Vaping Use: Never used  Substance and Sexual Activity   Alcohol use: No    Alcohol/week: 0.0 standard drinks of alcohol   Drug use: No   Sexual activity: Not on file    Comment: retired from [employer]. 1 son.

## 2022-08-01 ENCOUNTER — Encounter: Payer: Medicare Other | Admitting: Physical Therapy

## 2022-08-03 ENCOUNTER — Encounter: Payer: Medicare Other | Admitting: Physical Therapy

## 2022-08-11 ENCOUNTER — Ambulatory Visit (INDEPENDENT_AMBULATORY_CARE_PROVIDER_SITE_OTHER): Payer: Medicare Other | Admitting: Physician Assistant

## 2022-08-11 ENCOUNTER — Encounter: Payer: Self-pay | Admitting: Physician Assistant

## 2022-08-11 DIAGNOSIS — M5441 Lumbago with sciatica, right side: Secondary | ICD-10-CM

## 2022-08-11 NOTE — Progress Notes (Signed)
Office Visit Note   Patient: [name]           Date of Birth: 06-04-1941           MRN: [id number] Visit Date: 08/11/2022              Requested by: Lucianne Lei, MD [address] [place],  [place] [address] PCP: Lucianne Lei, MD  Chief Complaint  Patient presents with   Lower Back - Pain, Follow-up      HPI: [name] comes in today for reevaluation of her right thoracic and low back pain.  She reports she is doing much better.  She still has a little pain when she turns in a particular direction and it seems to be affected by weather.  She does have exercises which she says she does a little bit  Assessment & Plan: Visit Diagnoses: Back pain  Plan: She says she is doing well for now does not want to pursue any further treatment at this time.  We could get an MRI she does have a lot of known arthritis in her thoracic spine she may follow-up as needed  Follow-Up Instructions: No follow-ups on file.   Ortho Exam  Patient is alert, oriented, no adenopathy, well-dressed, normal affect, normal respiratory effort. Examination she has some mild tenderness to the lower thoracic upper lower back where the musculature.  She is able to take deep breaths without difficulty.  She is neurovascularly intact  Imaging: No results found. No images are attached to the encounter.  Labs: No results found for: "HGBA1C", "ESRSEDRATE", "CRP", "LABURIC", "REPTSTATUS", "GRAMSTAIN", "CULT", "LABORGA"   No results found for: "ALBUMIN", "PREALBUMIN", "CBC"  No results found for: "MG" No results found for: "VD25OH"  No results found for: "PREALBUMIN"    Latest Ref Rng & Units 11/09/2005    1:23 PM  CBC EXTENDED  WBC 3.9 - 10.0 10e3/uL 2.5   RBC 3.70 - 5.32 10e6/uL 4.61   Hemoglobin 11.6 - 15.9 g/dL 14.1   HCT 34.8 - 46.6 % 41.8   Platelets 145 - 400 10e3/uL 174   NEUT# 1.5 - 6.5 10e3/uL 0.8   Lymph# 0.9 - 3.3 10e3/uL 1.4      There is no height or weight on file to calculate  BMI.  Orders:  No orders of the defined types were placed in this encounter.  No orders of the defined types were placed in this encounter.    Procedures: No procedures performed  Clinical Data: No additional findings.  ROS:  All other systems negative, except as noted in the HPI. Review of Systems  Objective: Vital Signs: There were no vitals taken for this visit.  Specialty Comments:  No specialty comments available.  PMFS History: Patient Active Problem List   Diagnosis Date Noted   Arthritis of right hip 03/21/2022   Reactive airway disease 07/29/2020   Seasonal and perennial allergic rhinitis 07/29/2020   Gastroesophageal reflux disease 07/29/2020   Unilateral primary osteoarthritis, right knee 11/21/2017   Status post revision of total knee, left 11/21/2017   Chronic rhinitis 02/25/2016   Cough 02/25/2016   Hoarseness of voice 02/25/2016   Chronic leukopenia 12/15/2015   Past Medical History:  Diagnosis Date   Asthma    allergic induced   Cough    Hypertension    Rhinitis     Family History  Problem Relation Age of Onset   Other Mother        died when  patient was 107 months old   Hypertension Father    Cerebral aneurysm Sister    Breast cancer Sister    Lung cancer Sister    Healthy Son    Allergic rhinitis Neg Hx    Angioedema Neg Hx    Asthma Neg Hx    Eczema Neg Hx    Immunodeficiency Neg Hx    Urticaria Neg Hx     Past Surgical History:  Procedure Laterality Date   ABDOMINAL HYSTERECTOMY     BACK SURGERY     CATARACT EXTRACTION     MASS EXCISION  01/05/2012   Procedure: MINOR EXCISION OF MASS;  Surgeon: Cammie Sickle., MD;  Location: [facility];  Service: Orthopedics;  Laterality: Right;  excision mucoid cyst right long finger, debride DIP joint   ROTATOR CUFF REPAIR Bilateral    TOTAL KNEE ARTHROPLASTY Left    twice   Social History   Occupational History   Occupation: retired    Comment: Warehouse manager   Tobacco Use   Smoking status: Never   Smokeless tobacco: Never  Vaping Use   Vaping Use: Never used  Substance and Sexual Activity   Alcohol use: No    Alcohol/week: 0.0 standard drinks of alcohol   Drug use: No   Sexual activity: Not on file    Comment: retired from [employer]. 1 son.

## 2022-08-12 NOTE — Progress Notes (Unsigned)
Assessment/Plan:    1.  Essential Tremor  -continue primidone, 50 mg, 4 tablets in the morning and 1 tablets at night.    2.  Leukopenia  -Follows with hematology.  Has had bone marrow biopsy.  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  Reports has f/u in few weeks  3.  F/u 1 year  Subjective:   [name] was seen today in follow up for essential tremor.  Records reviewed prior to today's visit..  Primidone increased last visit but still with some tremor.   Has been following with orthopedics for back and knee pain.  Current prescribed movement disorder medications: primidone 50 mg, 4 tablets in the morning, 1 tablet at night (increase)    ALLERGIES:   Allergies  Allergen Reactions   Latex Shortness Of Breath and Other (See Comments)    CURRENT MEDICATIONS:  Outpatient Encounter Medications as of 08/15/2022  Medication Sig   Ascorbic Acid (VITAMIN C) 250 MG CHEW Chew 250 mg by mouth.   azelastine (ASTELIN) 0.1 % nasal spray Place 2 sprays into both nostrils 2 (two) times daily. Use in each nostril as directed   CALCIUM-MAGNESIUM-ZINC PO Take by mouth.   Carbinoxamine Maleate (RYVENT) 6 MG TABS Take 6 mg by mouth twice a day   cholecalciferol (VITAMIN D3) 25 MCG (1000 UT) tablet Take 1,000 Units by mouth daily.   diclofenac sodium (VOLTAREN) 1 % GEL    Diclofenac Sodium 1.5 % SOLN Place onto the skin. Reported on 08/26/2015   docusate sodium (COLACE) 100 MG capsule Take 100 mg by mouth daily as needed for mild constipation.   fluticasone (FLONASE) 50 MCG/ACT nasal spray SHAKE LIQUID AND USE 2 SPRAYS IN EACH NOSTRIL DAILY   ipratropium (ATROVENT) 0.06 % nasal spray Place 2 sprays into both nostrils 3 (three) times daily as needed (runny nose).   levalbuterol (XOPENEX HFA) 45 MCG/ACT inhaler Inhale 2 puffs into the lungs every 4 (four) hours as needed.   meloxicam (MOBIC) 7.5 MG tablet Take 1 tablet (7.5 mg total) by mouth daily.    multivitamin-iron-minerals-folic acid (CENTRUM) chewable tablet Chew 1 tablet by mouth daily.   NEXLETOL 180 MG TABS Take 1 tablet by mouth daily.   olmesartan-hydrochlorothiazide (BENICAR HCT) 20-12.5 MG per tablet Take 1 tablet by mouth daily.   primidone (MYSOLINE) 50 MG tablet TAKE 4 TABLETS BY MOUTH EVERY MORNING AND 1 EVERY NIGHT AT BEDTIME.   SYMBICORT 160-4.5 MCG/ACT inhaler Inhale 2 puffs into the lungs 2 (two) times daily.   traMADol (ULTRAM) 50 MG tablet Take 1 tablet (50 mg total) by mouth every 12 (twelve) hours as needed.   [DISCONTINUED] betamethasone dipropionate (DIPROLENE) 0.05 % ointment  (Patient not taking: Reported on 08/15/2022)   [DISCONTINUED] montelukast (SINGULAIR) 10 MG tablet TAKE 1 TABLET(10 MG) BY MOUTH AT BEDTIME (Patient not taking: Reported on 08/15/2022)   No facility-administered encounter medications on file as of 08/15/2022.     Objective:    PHYSICAL EXAMINATION:    VITALS:   Vitals:   08/15/22 1117  BP: (!) 150/81  Pulse: 79  SpO2: 99%  Weight: 178 lb (80.7 kg)  Height: 5' 5.5" (1.664 m)   GEN:  The patient appears stated age and is in NAD. HEENT:  Normocephalic, atraumatic.    Neurological examination:  Orientation: The patient is alert and oriented x3. Cranial nerves: There is good facial symmetry. The speech is fluent and clear. Soft palate rises symmetrically and there is no tongue  deviation. Hearing is intact to conversational tone. Sensation: Sensation is intact to light touch throughout Motor: Strength is at least antigravity x4.  Movement examination: Tone: There is normal tone in the UE/LE Abnormal movements: there is no rest tremor.  No postural tremor.  No significant intention tremor but does have trouble with archimedes spirals on the L Coordination:  There is no decremation with RAM's Gait and Station: The patient has an antalgic gait.  She drags the left leg.  This is same as last visit    Cc:  Lucianne Lei, MD

## 2022-08-15 ENCOUNTER — Ambulatory Visit (INDEPENDENT_AMBULATORY_CARE_PROVIDER_SITE_OTHER): Payer: Medicare Other | Admitting: Neurology

## 2022-08-15 ENCOUNTER — Encounter: Payer: Self-pay | Admitting: Neurology

## 2022-08-15 DIAGNOSIS — G250 Essential tremor: Secondary | ICD-10-CM | POA: Diagnosis not present

## 2022-08-15 MED ORDER — PRIMIDONE 50 MG PO TABS: ORAL_TABLET | ORAL | 2 refills | Status: DC

## 2022-08-15 NOTE — Patient Instructions (Signed)
Good to see you today!  The physicians and staff at [facility] are committed to providing excellent care. You may receive a survey requesting feedback about your experience at our office. We strive to receive "very good" responses to the survey questions. If you feel that your experience would prevent you from giving the office a "very good " response, please contact our office to try to remedy the situation. We may be reached at [phone]. Thank you for taking the time out of your busy day to complete the survey.

## 2022-08-23 ENCOUNTER — Ambulatory Visit: Payer: Self-pay

## 2022-08-23 ENCOUNTER — Ambulatory Visit (INDEPENDENT_AMBULATORY_CARE_PROVIDER_SITE_OTHER): Payer: Medicare Other | Admitting: Family

## 2022-08-23 DIAGNOSIS — M549 Dorsalgia, unspecified: Secondary | ICD-10-CM | POA: Diagnosis not present

## 2022-08-23 MED ORDER — TRAMADOL HCL 50 MG PO TABS
50.0000 mg | ORAL_TABLET | Freq: Four times a day (QID) | ORAL | 0 refills | Status: AC | PRN
Start: 2022-08-23 — End: ?

## 2022-08-23 NOTE — Progress Notes (Signed)
Office Visit Note   Patient: [name]           Date of Birth: 1941/03/02           MRN: [id number] Visit Date: 08/23/2022              Requested by: Lucianne Lei, [address] [place],  [place] [address] PCP: Lucianne Lei, MD  Chief Complaint  Patient presents with   Lower Back - Follow-up      HPI: The patient is a 82 year old woman who is seen for concern of intense new back pain to her mid back this is about the level of her bra and it radiates around her left rib cage she is complaining of shooting stabbing constant pain this is tender to the touch.  She states if she holds very still the pain does abate some.  Has been constant since Saturday not had any recent injuries.  She does know that she has a history of significant arthritis in her thoracic and lumbar spine.  She is having also some lumbar spine pain without any radicular symptoms into the lower extremities  Denies any rashes States has had the shingles vaccine  Assessment & Plan: Visit Diagnoses:  1. Mid-back pain, acute     Plan: Concern for thoracic spine radiculopathy and pain versus possible concern for impending shingles.  Discussed case with physiatry.  Will proceed with MRI of the thoracic spine in hopes of getting her set up for a T-spine ESI Referral to physiatry  Follow-Up Instructions: No follow-ups on file.   Back Exam   Tenderness  The patient is experiencing tenderness in the thoracic.  Range of Motion  The patient has normal back ROM.  Other  Gait: abnormal   Comments:  Left flank tenderness in single nerve root distribution      Patient is alert, oriented, no adenopathy, well-dressed, normal affect, normal respiratory effort. On examination of the mid and upper back.  There is no rash no erythema no warmth no skin abnormality Imaging: No results found. No images are attached to the encounter.  Labs: No results found for: "HGBA1C", "ESRSEDRATE", "CRP",  "LABURIC", "REPTSTATUS", "GRAMSTAIN", "CULT", "LABORGA"   No results found for: "ALBUMIN", "PREALBUMIN", "CBC"  No results found for: "MG" No results found for: "VD25OH"  No results found for: "PREALBUMIN"    Latest Ref Rng & Units 11/09/2005    1:23 PM  CBC EXTENDED  WBC 3.9 - 10.0 10e3/uL 2.5   RBC 3.70 - 5.32 10e6/uL 4.61   Hemoglobin 11.6 - 15.9 g/dL 14.1   HCT 34.8 - 46.6 % 41.8   Platelets 145 - 400 10e3/uL 174   NEUT# 1.5 - 6.5 10e3/uL 0.8   Lymph# 0.9 - 3.3 10e3/uL 1.4      There is no height or weight on file to calculate BMI.  Orders:  Orders Placed This Encounter  Procedures   XR Thoracic Spine 2 View   No orders of the defined types were placed in this encounter.    Procedures: No procedures performed  Clinical Data: No additional findings.  ROS:  All other systems negative, except as noted in the HPI. Review of Systems  Constitutional: Negative.   Musculoskeletal:  Positive for back pain and myalgias.  Neurological:  Negative for weakness and numbness.    Objective: Vital Signs: There were no vitals taken for this visit.  Specialty Comments:  No specialty comments available.  PMFS History: Patient Active  Problem List   Diagnosis Date Noted   Arthritis of right hip 03/21/2022   Reactive airway disease 07/29/2020   Seasonal and perennial allergic rhinitis 07/29/2020   Gastroesophageal reflux disease 07/29/2020   Unilateral primary osteoarthritis, right knee 11/21/2017   Status post revision of total knee, left 11/21/2017   Chronic rhinitis 02/25/2016   Cough 02/25/2016   Hoarseness of voice 02/25/2016   Chronic leukopenia 12/15/2015   Past Medical History:  Diagnosis Date   Asthma    allergic induced   Cough    Hypertension    Rhinitis     Family History  Problem Relation Age of Onset   Other Mother        died when patient was 31 months old   Hypertension Father    Cerebral aneurysm Sister    Breast cancer Sister    Lung  cancer Sister    Healthy Son    Allergic rhinitis Neg Hx    Angioedema Neg Hx    Asthma Neg Hx    Eczema Neg Hx    Immunodeficiency Neg Hx    Urticaria Neg Hx     Past Surgical History:  Procedure Laterality Date   ABDOMINAL HYSTERECTOMY     BACK SURGERY     CATARACT EXTRACTION     MASS EXCISION  01/05/2012   Procedure: MINOR EXCISION OF MASS;  Surgeon: Cammie Sickle., MD;  Location: [facility];  Service: Orthopedics;  Laterality: Right;  excision mucoid cyst right long finger, debride DIP joint   ROTATOR CUFF REPAIR Bilateral    TOTAL KNEE ARTHROPLASTY Left    twice   Social History   Occupational History   Occupation: retired    Comment: Warehouse manager  Tobacco Use   Smoking status: Never   Smokeless tobacco: Never  Vaping Use   Vaping Use: Never used  Substance and Sexual Activity   Alcohol use: No    Alcohol/week: 0.0 standard drinks of alcohol   Drug use: No   Sexual activity: Not on file    Comment: retired from [employer]. 1 son.

## 2022-08-24 ENCOUNTER — Encounter: Payer: Self-pay | Admitting: Family

## 2022-08-25 DIAGNOSIS — H35033 Hypertensive retinopathy, bilateral: Secondary | ICD-10-CM | POA: Diagnosis not present

## 2022-08-25 DIAGNOSIS — H524 Presbyopia: Secondary | ICD-10-CM | POA: Diagnosis not present

## 2022-08-25 DIAGNOSIS — H40019 Open angle with borderline findings, low risk, unspecified eye: Secondary | ICD-10-CM | POA: Diagnosis not present

## 2022-09-01 ENCOUNTER — Other Ambulatory Visit: Payer: Self-pay | Admitting: Allergy

## 2022-09-05 ENCOUNTER — Ambulatory Visit
Admission: RE | Admit: 2022-09-05 | Discharge: 2022-09-05 | Disposition: A | Discharge status: Home / Self Care | Payer: Medicare Other | Source: Ambulatory Visit | Attending: Family | Admitting: Family

## 2022-09-05 DIAGNOSIS — M549 Dorsalgia, unspecified: Secondary | ICD-10-CM

## 2022-09-05 DIAGNOSIS — M546 Pain in thoracic spine: Secondary | ICD-10-CM | POA: Diagnosis not present

## 2022-09-09 ENCOUNTER — Ambulatory Visit (INDEPENDENT_AMBULATORY_CARE_PROVIDER_SITE_OTHER): Payer: Medicare Other | Admitting: Physical Medicine and Rehabilitation

## 2022-09-09 ENCOUNTER — Encounter: Payer: Self-pay | Admitting: Physical Medicine and Rehabilitation

## 2022-09-09 DIAGNOSIS — G8929 Other chronic pain: Secondary | ICD-10-CM

## 2022-09-09 DIAGNOSIS — M48062 Spinal stenosis, lumbar region with neurogenic claudication: Secondary | ICD-10-CM | POA: Diagnosis not present

## 2022-09-09 DIAGNOSIS — M5416 Radiculopathy, lumbar region: Secondary | ICD-10-CM | POA: Diagnosis not present

## 2022-09-09 DIAGNOSIS — M546 Pain in thoracic spine: Secondary | ICD-10-CM

## 2022-09-09 DIAGNOSIS — M47816 Spondylosis without myelopathy or radiculopathy, lumbar region: Secondary | ICD-10-CM | POA: Diagnosis not present

## 2022-09-09 MED ORDER — DIAZEPAM 5 MG PO TABS: ORAL_TABLET | ORAL | 0 refills | Status: DC

## 2022-09-09 NOTE — Progress Notes (Unsigned)
[name] - 82 y.o. female MRN [id number]  Date of birth: Sep 09, 1940  Office Visit Note: Visit Date: 09/09/2022 PCP: Lucianne Lei, MD Referred by: Lucianne Lei, MD  Subjective: Chief Complaint  Patient presents with   Middle Back - Pain   Lower Back - Pain   HPI: [name] is a 82 y.o. female who comes in today per the request of Dondra Prader, NP for evaluation of bilateral lower back pain radiating to lateral hips, upper lumbar pain and bilateral thoracic back pain radiating around to ribs. Upper lumbar pain seems to be most severe and worsens with standing. Pain intensifies when she is standing for long periods to perform household chores such as washing dishes and sweeping. She describes pain as sore sensation, currently rates as 8 out of 10. Some relief of pain with home exercise regimen, rest and use of medications. She does take Tramadol intermittently that helps to alleviate her pain. Lumbar MRI imaging from 2020 exhibits severe multifactorial spinal canal stenosis at L2-L3, prior left hemilaminectomy.at L4-L5 and L5-S1. History of lumbar surgery with Dr. Rodell Perna in 2006. Thoracic pain seems to be more at level of bra line and does extend around to bilateral ribs. Diffuse tenderness noted to entire back upon palpation today. Recent thoracic MRI imaging exhibits multi level spondylosis, no high grade spinal canal stenosis, without high-grade spinal stenosis or definite nerve root impingement. History of bilateral L2 transforaminal epidural steroid injection in our office in 2020, she reports significant relief of pain with this procedure. Patient denies focal weakness, numbness and tingling. No recent trauma or falls.     Review of Systems  Musculoskeletal:  Positive for back pain and myalgias.  Neurological:  Negative for tingling, sensory change, focal weakness and weakness.  All other systems reviewed and are negative.  Otherwise per HPI.  Assessment & Plan: Visit  Diagnoses:    ICD-10-CM   1. Lumbar radiculopathy  M54.16 Ambulatory referral to Physical Medicine Rehab    2. Spinal stenosis of lumbar region with neurogenic claudication  M48.062 Ambulatory referral to Physical Medicine Rehab    3. Facet arthropathy, lumbar  M47.816 Ambulatory referral to Physical Medicine Rehab    4. Chronic bilateral thoracic back pain  M54.6    G89.29        Plan: Findings:  1. Chronic, worsening and severe lower back pain radiating to lateral hips and upper lumbar region. Patient continues to have severe pain despite good conservative therapies such as formal physical therapy, home exercise regimen, rest and use of medications. Patients clinical presentation and exam are consistent with neurogenic claudication as a result of spinal canal stenosis. Her pain does become severe with prolonged standing. Significant relief from previous lumbar epidural steroid injection in 2020. Next step is to perform diagnostic and hopefully therapeutic bilateral L2 transforaminal epidural steroid injection under fluoroscopic guidance. I did call in pre-procedure Valium today as she does voice anxiety related to procedures. If good relief of pain with injection we can repeat this procedure infrequently as needed. If her pain persists we would consider facet joint blocks. No red flag symptoms noted upon exam today.  2. Chronic, worsening and severe bilateral thoracic back pain radiating around to ribs. She continues to have pain despite recent formal physical therapy. Patients clinical presentation and exam are consistent with myofascial pain syndrome. She is diffusely tender to palpation to bilateral thoracic paraspinal regions today. I do feel if her pain persists she would benefit from myofascial  trigger point injections, we could perform in our office. I also think re-grouping with physical therapy for dry needling treatments would be beneficial. Her pain could be referred pain from lower back.  Could also be a type of central sensitization syndrome such as fibromyalgia. We will see how she does with lumbar epidural steroid injection and will reassess thoracic pain at later date.     Meds & Orders:  Meds ordered this encounter  Medications   diazepam (VALIUM) 5 MG tablet    Sig: Take one tablet by mouth with food one hour prior to procedure. May repeat 30 minutes prior if needed.    Dispense:  2 tablet    Refill:  0    Orders Placed This Encounter  Procedures   Ambulatory referral to Physical Medicine Rehab    Follow-up: Return for Bilateral L2 transforaminal epidural steroid injection.   Procedures: No procedures performed      Clinical History: No specialty comments available.   She reports that she has never smoked. She has never used smokeless tobacco. No results for input(s): "HGBA1C", "LABURIC" in the last 8760 hours.  Objective:  VS:  HT:    WT:   BMI:     BP:   HR: bpm  TEMP: ( )  RESP:  Physical Exam Vitals and nursing note reviewed.  HENT:     Head: Normocephalic and atraumatic.     Right Ear: External ear normal.     Left Ear: External ear normal.     Nose: Nose normal.     Mouth/Throat:     Mouth: Mucous membranes are moist.  Eyes:     Extraocular Movements: Extraocular movements intact.  Cardiovascular:     Rate and Rhythm: Normal rate.     Pulses: Normal pulses.  Pulmonary:     Effort: Pulmonary effort is normal.  Abdominal:     General: Abdomen is flat. There is no distension.  Musculoskeletal:        General: Tenderness present.     Cervical back: Normal range of motion.     Comments: Patient rises from seated position to standing without difficulty. Good lumbar range of motion. No pain noted with facet loading. 5/5 strength noted with bilateral hip flexion, knee flexion/extension, ankle dorsiflexion/plantarflexion and EHL. No clonus noted bilaterally. No pain upon palpation of greater trochanters. No pain with internal/external rotation  of bilateral hips. Sensation intact bilaterally. Tenderness noted to bilateral thoracic paraspinal region. Ambulates without aid, gait steady.     Skin:    General: Skin is warm and dry.     Capillary Refill: Capillary refill takes less than 2 seconds.  Neurological:     General: No focal deficit present.     Mental Status: She is alert and oriented to person, place, and time.  Psychiatric:        Mood and Affect: Mood normal.        Behavior: Behavior normal.     Ortho Exam  Imaging: No results found.  Past Medical/Family/Surgical/Social History: Medications & Allergies reviewed per EMR, new medications updated. Patient Active Problem List   Diagnosis Date Noted   Arthritis of right hip 03/21/2022   Reactive airway disease 07/29/2020   Seasonal and perennial allergic rhinitis 07/29/2020   Gastroesophageal reflux disease 07/29/2020   Unilateral primary osteoarthritis, right knee 11/21/2017   Status post revision of total knee, left 11/21/2017   Chronic rhinitis 02/25/2016   Cough 02/25/2016   Hoarseness of voice 02/25/2016  Chronic leukopenia 12/15/2015   Past Medical History:  Diagnosis Date   Asthma    allergic induced   Cough    Hypertension    Rhinitis    Family History  Problem Relation Age of Onset   Other Mother        died when patient was 7 months old   Hypertension Father    Cerebral aneurysm Sister    Breast cancer Sister    Lung cancer Sister    Healthy Son    Allergic rhinitis Neg Hx    Angioedema Neg Hx    Asthma Neg Hx    Eczema Neg Hx    Immunodeficiency Neg Hx    Urticaria Neg Hx    Past Surgical History:  Procedure Laterality Date   ABDOMINAL HYSTERECTOMY     BACK SURGERY     CATARACT EXTRACTION     MASS EXCISION  01/05/2012   Procedure: MINOR EXCISION OF MASS;  Surgeon: Cammie Sickle., MD;  Location: [facility];  Service: Orthopedics;  Laterality: Right;  excision mucoid cyst right long finger, debride DIP joint    ROTATOR CUFF REPAIR Bilateral    TOTAL KNEE ARTHROPLASTY Left    twice   Social History   Occupational History   Occupation: retired    Comment: Warehouse manager  Tobacco Use   Smoking status: Never   Smokeless tobacco: Never  Vaping Use   Vaping Use: Never used  Substance and Sexual Activity   Alcohol use: No    Alcohol/week: 0.0 standard drinks of alcohol   Drug use: No   Sexual activity: Not on file    Comment: retired from [employer]. 1 son.

## 2022-09-09 NOTE — Progress Notes (Unsigned)
Functional Pain Scale - descriptive words and definitions  Distracting (5)    Aware of pain/able to complete some ADL's but limited by pain/sleep is affected and active distractions are only slightly useful. Moderate range order  Average Pain 4  Middle and lower back that radiates into the rib cage and to the buttocks

## 2022-09-14 ENCOUNTER — Ambulatory Visit (INDEPENDENT_AMBULATORY_CARE_PROVIDER_SITE_OTHER): Payer: Medicare Other | Admitting: Allergy

## 2022-09-14 ENCOUNTER — Encounter: Payer: Self-pay | Admitting: Allergy

## 2022-09-14 VITALS — BP 112/72 | HR 74 | Temp 98.1°F | Resp 16

## 2022-09-14 DIAGNOSIS — J3089 Other allergic rhinitis: Secondary | ICD-10-CM

## 2022-09-14 DIAGNOSIS — J302 Other seasonal allergic rhinitis: Secondary | ICD-10-CM

## 2022-09-14 DIAGNOSIS — J4540 Moderate persistent asthma, uncomplicated: Secondary | ICD-10-CM

## 2022-09-14 MED ORDER — IPRATROPIUM BROMIDE 0.06 % NA SOLN: 2.0000 | Freq: Three times a day (TID) | NASAL | 5 refills | Status: DC | PRN

## 2022-09-14 NOTE — Progress Notes (Signed)
Follow-up Note  RE: [name] MRN: [id number] DOB: 20-Nov-1940 Date of Office Visit: 09/14/2022   History of present illness: [name] is a 82 y.o. female presenting today for follow-up of reactive airway/cough, allergic rhinitis.  She was last seen in the office on 03/16/22 by myself.  She has not had any major health changes, surgeries or hospitalizations.  She does report for last several weeks she has had more runny nose "galore" as well as hoarseness.  She states the cough is not as bad currently but the drainage is what is bothersome.  She states she is using the azelastine twice a day and not seeing much benefit and using the ipratropium less.  She does note the ipratropium seems to be more helpful for the drainage control.  She does need it refilled.  She has carbinoxamine as antihistamine.   She does report that she will use xopenex 1-2 a week for cough and it does help resolve cough at the time.  She does continue on singulair and uses symbicort 2 puffs twice a day.  She has not had any Ed/UC visits or systemic steroids for asthma symptoms. She states she did use the ipratropium prior to this visit and thus nose isnt running right now.   Review of systems: Review of Systems  Constitutional: Negative.   HENT:         See HPI  Eyes: Negative.   Respiratory: Negative.    Cardiovascular: Negative.   Gastrointestinal: Negative.   Musculoskeletal: Negative.   Skin: Negative.   Allergic/Immunologic: Negative.   Neurological: Negative.      All other systems negative unless noted above in HPI  Past medical/social/surgical/family history have been reviewed and are unchanged unless specifically indicated below.  No changes  Medication List: Current Outpatient Medications  Medication Sig Dispense Refill   Ascorbic Acid (VITAMIN C) 250 MG CHEW Chew 250 mg by mouth.     azelastine (ASTELIN) 0.1 % nasal spray Place 2 sprays into both nostrils 2 (two) times daily. Use  in each nostril as directed 90 mL 2   CALCIUM-MAGNESIUM-ZINC PO Take by mouth.     Carbinoxamine Maleate (RYVENT) 6 MG TABS Take 6 mg by mouth twice a day 120 tablet 2   cholecalciferol (VITAMIN D3) 25 MCG (1000 UT) tablet Take 1,000 Units by mouth daily.     diazepam (VALIUM) 5 MG tablet Take one tablet by mouth with food one hour prior to procedure. May repeat 30 minutes prior if needed. 2 tablet 0   diclofenac sodium (VOLTAREN) 1 % GEL      Diclofenac Sodium 1.5 % SOLN Place onto the skin. Reported on 08/26/2015     docusate sodium (COLACE) 100 MG capsule Take 100 mg by mouth daily as needed for mild constipation.     fluticasone (FLONASE) 50 MCG/ACT nasal spray SHAKE LIQUID AND USE 2 SPRAYS IN EACH NOSTRIL DAILY 48 g 1   levalbuterol (XOPENEX HFA) 45 MCG/ACT inhaler Inhale 2 puffs into the lungs every 4 (four) hours as needed. 15 g 1   meloxicam (MOBIC) 7.5 MG tablet Take 1 tablet (7.5 mg total) by mouth daily. 20 tablet 0   multivitamin-iron-minerals-folic acid (CENTRUM) chewable tablet Chew 1 tablet by mouth daily.     NEXLETOL 180 MG TABS Take 1 tablet by mouth daily.     olmesartan-hydrochlorothiazide (BENICAR HCT) 20-12.5 MG per tablet Take 1 tablet by mouth daily.     primidone (MYSOLINE) 50 MG tablet  4 in the AM, 1 po q hs 450 tablet 2   SYMBICORT 160-4.5 MCG/ACT inhaler Inhale 2 puffs into the lungs 2 (two) times daily. 10.2 g 5   traMADol (ULTRAM) 50 MG tablet Take 1 tablet (50 mg total) by mouth every 6 (six) hours as needed. 30 tablet 0   ipratropium (ATROVENT) 0.06 % nasal spray Place 2 sprays into both nostrils 3 (three) times daily as needed (runny nose). 15 mL 5   No current facility-administered medications for this visit.     Known medication allergies: Allergies  Allergen Reactions   Latex Shortness Of Breath and Other (See Comments)     Physical examination: Blood pressure 112/72, pulse 74, temperature 98.1 F (36.7 C), temperature source Temporal, resp. rate 16,  SpO2 96 %.  General: Alert, interactive, in no acute distress. HEENT: PERRLA, TMs pearly gray, turbinates minimally edematous without discharge, post-pharynx non erythematous. Neck: Supple without lymphadenopathy. Lungs: Clear to auscultation without wheezing, rhonchi or rales. {no increased work of breathing. CV: Normal S1, S2 without murmurs. Abdomen: Nondistended, nontender. Skin: Warm and dry, without lesions or rashes. Extremities:  No clubbing, cyanosis or edema. Neuro:   Grossly intact.  Diagnositics/Labs: None today  Assessment and plan: Cough Continue montelukast 10 mg once a day to prevent cough or wheeze Continue Symbicort 160-2 puffs 1-2 times a day to prevent cough or wheeze Continue Xopenex 2 puffs once every 4-6 hours as needed for cough or wheeze For asthma flare, increase Symbicort 160 to 2 puffs twice a day with a spacer for 2 weeks or when cough and wheeze free then return to previous dosing  Allergic rhinitis Continue allergen avoidance measures Continue Flonase 1-2 sprays in each nostril daily for 1-2 weeks at a time before stopping once nasal congestion improves for maximum benefit Can stop Azelastine as does not seem effective at this time Use more Ipratropium 2 sprays twice a day and can use additional 1-2 times in the day if needed. Max use of 4 times a day as needed In the right nostril, point the applicator out toward the right ear. In the left nostril, point the applicator out toward the left ear Continue saline nasal rinses as needed for nasal symptoms. Use this before any medicated nasal sprays for best result Use RyVent (carbinoxamine) 6-8 mg twice a day to control nasal symptoms.    At your next ENT visit ask them about Cryofix, a nasal procedure that can help chronic nasal drainage symptoms.    Follow up in 6 months or sooner if needed.  I appreciate the opportunity to take part in Zonie's care. Please do not hesitate to contact me with  questions.  Sincerely,   Prudy Feeler, MD Allergy/Immunology [facility]

## 2022-09-14 NOTE — Patient Instructions (Addendum)
Cough Continue montelukast 10 mg once a day to prevent cough or wheeze Continue Symbicort 160-2 puffs 1-2 times a day to prevent cough or wheeze Continue Xopenex 2 puffs once every 4-6 hours as needed for cough or wheeze For asthma flare, increase Symbicort 160 to 2 puffs twice a day with a spacer for 2 weeks or when cough and wheeze free then return to previous dosing  Allergic rhinitis Continue allergen avoidance measures Continue Flonase 1-2 sprays in each nostril daily for 1-2 weeks at a time before stopping once nasal congestion improves for maximum benefit Can stop Azelastine as does not seem effective at this time Use more Ipratropium 2 sprays twice a day and can use additional 1-2 times in the day if needed. Max use of 4 times a day as needed In the right nostril, point the applicator out toward the right ear. In the left nostril, point the applicator out toward the left ear Continue saline nasal rinses as needed for nasal symptoms. Use this before any medicated nasal sprays for best result Use RyVent (carbinoxamine) 6-8 mg twice a day to control nasal symptoms.    At your next ENT visit ask them about Cryofix, a nasal procedure that can help chronic nasal drainage symptoms.    Follow up in 6 months or sooner if needed.

## 2022-09-19 DIAGNOSIS — Z1231 Encounter for screening mammogram for malignant neoplasm of breast: Secondary | ICD-10-CM | POA: Diagnosis not present

## 2022-09-26 ENCOUNTER — Ambulatory Visit (INDEPENDENT_AMBULATORY_CARE_PROVIDER_SITE_OTHER): Payer: Medicare Other | Admitting: Physical Medicine and Rehabilitation

## 2022-09-26 ENCOUNTER — Other Ambulatory Visit: Payer: Self-pay

## 2022-09-26 VITALS — BP 145/77 | HR 74

## 2022-09-26 DIAGNOSIS — M5416 Radiculopathy, lumbar region: Secondary | ICD-10-CM

## 2022-09-26 MED ORDER — DEXAMETHASONE SODIUM PHOSPHATE 10 MG/ML IJ SOLN
15.0000 mg | Freq: Once | INTRAMUSCULAR | Status: AC
  Administered 2022-09-26: 15 mg

## 2022-09-26 NOTE — Patient Instructions (Signed)

## 2022-09-26 NOTE — Progress Notes (Signed)
Functional Pain Scale - descriptive words and definitions  Distracting (5)    Aware of pain/able to complete some ADL's but limited by pain/sleep is affected and active distractions are only slightly useful. Moderate range order  Average Pain 3-4   +Driver, -BT, -Dye Allergies.  Lower back pain on both sides with no radiation

## 2022-10-03 DIAGNOSIS — D704 Cyclic neutropenia: Secondary | ICD-10-CM | POA: Diagnosis not present

## 2022-10-05 ENCOUNTER — Other Ambulatory Visit: Payer: Self-pay | Admitting: Allergy

## 2022-10-05 DIAGNOSIS — H6123 Impacted cerumen, bilateral: Secondary | ICD-10-CM | POA: Diagnosis not present

## 2022-10-14 NOTE — Progress Notes (Signed)
[name] - 82 y.o. female MRN [id number]  Date of birth: 1941/06/04  Office Visit Note: Visit Date: 09/26/2022 PCP: Renaye Rakers, MD Referred by: Renaye Rakers, MD  Subjective: Chief Complaint  Patient presents with   Lower Back - Pain   HPI:  [name] is a 82 y.o. female who comes in today at the request of Ellin Goodie, FNP for planned Bilateral L4-5 Lumbar transforaminal epidural steroid injection with fluoroscopic guidance.  The patient has failed conservative care including home exercise, medications, time and activity modification.  This injection will be diagnostic and hopefully therapeutic.  Please see requesting physician notes for further details and justification.   ROS Otherwise per HPI.  Assessment & Plan: Visit Diagnoses:    ICD-10-CM   1. Lumbar radiculopathy  M54.16 XR C-ARM NO REPORT    Epidural Steroid injection    dexamethasone (DECADRON) injection 15 mg      Plan: No additional findings.   Meds & Orders:  Meds ordered this encounter  Medications   dexamethasone (DECADRON) injection 15 mg    Orders Placed This Encounter  Procedures   XR C-ARM NO REPORT   Epidural Steroid injection    Follow-up: Return for visit to requesting provider as needed.   Procedures: No procedures performed  Lumbosacral Transforaminal Epidural Steroid Injection - Sub-Pedicular Approach with Fluoroscopic Guidance  Patient: [name]      Date of Birth: 1941/03/22 MRN: [id number] PCP: Renaye Rakers, MD      Visit Date: 09/26/2022   Universal Protocol:    Date/Time: 09/26/2022  Consent Given By: the patient  Position: PRONE  Additional Comments: Vital signs were monitored before and after the procedure. Patient was prepped and draped in the usual sterile fashion. The correct patient, procedure, and site was verified.   Injection Procedure Details:   Procedure diagnoses: Lumbar radiculopathy [M54.16]    Meds Administered:  Meds ordered  this encounter  Medications   dexamethasone (DECADRON) injection 15 mg    Laterality: Bilateral  Location/Site: L4  Needle:5.0 in., 22 ga.  Short bevel or Quincke spinal needle  Needle Placement: Transforaminal  Findings:    -Comments: Excellent flow of contrast along the nerve, nerve root and into the epidural space.  Procedure Details: After squaring off the end-plates to get a true AP view, the C-arm was positioned so that an oblique view of the foramen as noted above was visualized. The target area is just inferior to the "nose of the scotty dog" or sub pedicular. The soft tissues overlying this structure were infiltrated with 2-3 ml. of 1% Lidocaine without Epinephrine.  The spinal needle was inserted toward the target using a "trajectory" view along the fluoroscope beam.  Under AP and lateral visualization, the needle was advanced so it did not puncture dura and was located close the 6 O'Clock position of the pedical in AP tracterory. Biplanar projections were used to confirm position. Aspiration was confirmed to be negative for CSF and/or blood. A 1-2 ml. volume of Isovue-250 was injected and flow of contrast was noted at each level. Radiographs were obtained for documentation purposes.   After attaining the desired flow of contrast documented above, a 0.5 to 1.0 ml test dose of 0.25% Marcaine was injected into each respective transforaminal space.  The patient was observed for 90 seconds post injection.  After no sensory deficits were reported, and normal lower extremity motor function was noted,   the above injectate was administered so that equal amounts  of the injectate were placed at each foramen (level) into the transforaminal epidural space.   Additional Comments:  No complications occurred Dressing: 2 x 2 sterile gauze and Band-Aid    Post-procedure details: Patient was observed during the procedure. Post-procedure instructions were reviewed.  Patient left the clinic  in stable condition.    Clinical History: No specialty comments available.     Objective:  VS:  HT:    WT:   BMI:     BP:(!) 145/77  HR:74bpm  TEMP: ( )  RESP:  Physical Exam Vitals and nursing note reviewed.  Constitutional:      General: She is not in acute distress.    Appearance: Normal appearance. She is not ill-appearing.  HENT:     Head: Normocephalic and atraumatic.     Right Ear: External ear normal.     Left Ear: External ear normal.  Eyes:     Extraocular Movements: Extraocular movements intact.  Cardiovascular:     Rate and Rhythm: Normal rate.     Pulses: Normal pulses.  Pulmonary:     Effort: Pulmonary effort is normal. No respiratory distress.  Abdominal:     General: There is no distension.     Palpations: Abdomen is soft.  Musculoskeletal:        General: Tenderness present.     Cervical back: Neck supple.     Right lower leg: No edema.     Left lower leg: No edema.     Comments: Patient has good distal strength with no pain over the greater trochanters.  No clonus or focal weakness.  Skin:    Findings: No erythema, lesion or rash.  Neurological:     General: No focal deficit present.     Mental Status: She is alert and oriented to person, place, and time.     Sensory: No sensory deficit.     Motor: No weakness or abnormal muscle tone.     Coordination: Coordination normal.  Psychiatric:        Mood and Affect: Mood normal.        Behavior: Behavior normal.      Imaging: No results found.

## 2022-10-14 NOTE — Procedures (Signed)
Lumbosacral Transforaminal Epidural Steroid Injection - Sub-Pedicular Approach with Fluoroscopic Guidance  Patient: [name]      Date of Birth: [birth year] MRN: [id number] PCP: Renaye Rakers, MD      Visit Date: 09/26/2022   Universal Protocol:    Date/Time: 09/26/2022  Consent Given By: the patient  Position: PRONE  Additional Comments: Vital signs were monitored before and after the procedure. Patient was prepped and draped in the usual sterile fashion. The correct patient, procedure, and site was verified.   Injection Procedure Details:   Procedure diagnoses: Lumbar radiculopathy [M54.16]    Meds Administered:  Meds ordered this encounter  Medications   dexamethasone (DECADRON) injection 15 mg    Laterality: Bilateral  Location/Site: L4  Needle:5.0 in., 22 ga.  Short bevel or Quincke spinal needle  Needle Placement: Transforaminal  Findings:    -Comments: Excellent flow of contrast along the nerve, nerve root and into the epidural space.  Procedure Details: After squaring off the end-plates to get a true AP view, the C-arm was positioned so that an oblique view of the foramen as noted above was visualized. The target area is just inferior to the "nose of the scotty dog" or sub pedicular. The soft tissues overlying this structure were infiltrated with 2-3 ml. of 1% Lidocaine without Epinephrine.  The spinal needle was inserted toward the target using a "trajectory" view along the fluoroscope beam.  Under AP and lateral visualization, the needle was advanced so it did not puncture dura and was located close the 6 O'Clock position of the pedical in AP tracterory. Biplanar projections were used to confirm position. Aspiration was confirmed to be negative for CSF and/or blood. A 1-2 ml. volume of Isovue-250 was injected and flow of contrast was noted at each level. Radiographs were obtained for documentation purposes.   After attaining the desired flow of contrast  documented above, a 0.5 to 1.0 ml test dose of 0.25% Marcaine was injected into each respective transforaminal space.  The patient was observed for 90 seconds post injection.  After no sensory deficits were reported, and normal lower extremity motor function was noted,   the above injectate was administered so that equal amounts of the injectate were placed at each foramen (level) into the transforaminal epidural space.   Additional Comments:  No complications occurred Dressing: 2 x 2 sterile gauze and Band-Aid    Post-procedure details: Patient was observed during the procedure. Post-procedure instructions were reviewed.  Patient left the clinic in stable condition.

## 2022-10-23 ENCOUNTER — Other Ambulatory Visit: Payer: Self-pay | Admitting: Allergy & Immunology

## 2022-10-26 ENCOUNTER — Telehealth: Payer: Self-pay | Admitting: Anesthesiology

## 2022-10-26 ENCOUNTER — Other Ambulatory Visit: Payer: Self-pay

## 2022-10-26 DIAGNOSIS — G250 Essential tremor: Secondary | ICD-10-CM

## 2022-10-26 MED ORDER — PRIMIDONE 50 MG PO TABS: ORAL_TABLET | ORAL | 0 refills | Status: DC

## 2022-10-26 NOTE — Telephone Encounter (Signed)
Prescription has been sent.

## 2022-10-26 NOTE — Telephone Encounter (Signed)
Pt called requesting a refill on her medication  1. Which medications need refilled? Primidone 50 mg  2. Which pharmacy/location is medication to be sent to? [facility] on [address]   3. Do they need a 30 day or 90 day supply? 90 day supply

## 2022-11-10 DIAGNOSIS — D704 Cyclic neutropenia: Secondary | ICD-10-CM | POA: Diagnosis not present

## 2022-11-11 DIAGNOSIS — E785 Hyperlipidemia, unspecified: Secondary | ICD-10-CM | POA: Diagnosis not present

## 2022-11-15 DIAGNOSIS — E782 Mixed hyperlipidemia: Secondary | ICD-10-CM | POA: Diagnosis not present

## 2022-11-15 DIAGNOSIS — M62830 Muscle spasm of back: Secondary | ICD-10-CM | POA: Diagnosis not present

## 2022-11-15 DIAGNOSIS — D729 Disorder of white blood cells, unspecified: Secondary | ICD-10-CM | POA: Diagnosis not present

## 2022-11-22 DIAGNOSIS — Z961 Presence of intraocular lens: Secondary | ICD-10-CM | POA: Diagnosis not present

## 2022-11-22 DIAGNOSIS — H18413 Arcus senilis, bilateral: Secondary | ICD-10-CM | POA: Diagnosis not present

## 2022-11-22 DIAGNOSIS — H02831 Dermatochalasis of right upper eyelid: Secondary | ICD-10-CM | POA: Diagnosis not present

## 2022-11-22 DIAGNOSIS — H26491 Other secondary cataract, right eye: Secondary | ICD-10-CM | POA: Diagnosis not present

## 2022-11-24 DIAGNOSIS — Z0389 Encounter for observation for other suspected diseases and conditions ruled out: Secondary | ICD-10-CM | POA: Diagnosis not present

## 2022-11-24 DIAGNOSIS — D72819 Decreased white blood cell count, unspecified: Secondary | ICD-10-CM | POA: Diagnosis not present

## 2022-11-24 DIAGNOSIS — D704 Cyclic neutropenia: Secondary | ICD-10-CM | POA: Diagnosis not present

## 2022-11-25 DIAGNOSIS — K7689 Other specified diseases of liver: Secondary | ICD-10-CM | POA: Diagnosis not present

## 2022-11-25 DIAGNOSIS — D704 Cyclic neutropenia: Secondary | ICD-10-CM | POA: Diagnosis not present

## 2022-12-16 DIAGNOSIS — K769 Liver disease, unspecified: Secondary | ICD-10-CM | POA: Diagnosis not present

## 2022-12-16 DIAGNOSIS — K7689 Other specified diseases of liver: Secondary | ICD-10-CM | POA: Diagnosis not present

## 2022-12-16 DIAGNOSIS — I700 Atherosclerosis of aorta: Secondary | ICD-10-CM | POA: Diagnosis not present

## 2022-12-16 DIAGNOSIS — R9389 Abnormal findings on diagnostic imaging of other specified body structures: Secondary | ICD-10-CM | POA: Diagnosis not present

## 2022-12-16 DIAGNOSIS — K449 Diaphragmatic hernia without obstruction or gangrene: Secondary | ICD-10-CM | POA: Diagnosis not present

## 2022-12-16 DIAGNOSIS — R160 Hepatomegaly, not elsewhere classified: Secondary | ICD-10-CM | POA: Diagnosis not present

## 2023-01-11 DIAGNOSIS — D704 Cyclic neutropenia: Secondary | ICD-10-CM | POA: Diagnosis not present

## 2023-02-15 DIAGNOSIS — I10 Essential (primary) hypertension: Secondary | ICD-10-CM | POA: Diagnosis not present

## 2023-02-15 DIAGNOSIS — E1169 Type 2 diabetes mellitus with other specified complication: Secondary | ICD-10-CM | POA: Diagnosis not present

## 2023-02-15 DIAGNOSIS — E782 Mixed hyperlipidemia: Secondary | ICD-10-CM | POA: Diagnosis not present

## 2023-02-16 DIAGNOSIS — G252 Other specified forms of tremor: Secondary | ICD-10-CM | POA: Diagnosis not present

## 2023-02-16 DIAGNOSIS — I10 Essential (primary) hypertension: Secondary | ICD-10-CM | POA: Diagnosis not present

## 2023-02-16 DIAGNOSIS — E1169 Type 2 diabetes mellitus with other specified complication: Secondary | ICD-10-CM | POA: Diagnosis not present

## 2023-02-16 DIAGNOSIS — E785 Hyperlipidemia, unspecified: Secondary | ICD-10-CM | POA: Diagnosis not present

## 2023-02-24 DIAGNOSIS — Z23 Encounter for immunization: Secondary | ICD-10-CM | POA: Diagnosis not present

## 2023-03-09 ENCOUNTER — Other Ambulatory Visit: Payer: Self-pay | Admitting: Allergy

## 2023-03-17 ENCOUNTER — Other Ambulatory Visit: Payer: Self-pay

## 2023-03-17 ENCOUNTER — Ambulatory Visit (INDEPENDENT_AMBULATORY_CARE_PROVIDER_SITE_OTHER): Payer: Medicare Other | Admitting: Allergy

## 2023-03-17 ENCOUNTER — Encounter: Payer: Self-pay | Admitting: Allergy

## 2023-03-17 ENCOUNTER — Telehealth: Payer: Self-pay

## 2023-03-17 VITALS — BP 138/80 | HR 77 | Temp 98.4°F | Resp 16 | Ht 63.75 in | Wt 169.6 lb

## 2023-03-17 DIAGNOSIS — J302 Other seasonal allergic rhinitis: Secondary | ICD-10-CM

## 2023-03-17 DIAGNOSIS — J3089 Other allergic rhinitis: Secondary | ICD-10-CM | POA: Diagnosis not present

## 2023-03-17 DIAGNOSIS — J4540 Moderate persistent asthma, uncomplicated: Secondary | ICD-10-CM

## 2023-03-17 MED ORDER — ALBUTEROL SULFATE HFA 108 (90 BASE) MCG/ACT IN AERS: 2.0000 | INHALATION_SPRAY | Freq: Four times a day (QID) | RESPIRATORY_TRACT | 1 refills | Status: DC | PRN

## 2023-03-17 MED ORDER — FLUTICASONE PROPIONATE 50 MCG/ACT NA SUSP: NASAL | 5 refills | Status: DC

## 2023-03-17 MED ORDER — SYMBICORT 160-4.5 MCG/ACT IN AERO: INHALATION_SPRAY | RESPIRATORY_TRACT | 5 refills | Status: DC

## 2023-03-17 MED ORDER — CARBINOXAMINE MALEATE 6 MG PO TABS: 1.0000 | ORAL_TABLET | Freq: Two times a day (BID) | ORAL | 2 refills | Status: DC

## 2023-03-17 MED ORDER — MONTELUKAST SODIUM 10 MG PO TABS: 10.0000 mg | ORAL_TABLET | Freq: Every day | ORAL | 1 refills | Status: DC

## 2023-03-17 MED ORDER — IPRATROPIUM BROMIDE 0.06 % NA SOLN: NASAL | 5 refills | Status: DC

## 2023-03-17 MED ORDER — LEVALBUTEROL TARTRATE 45 MCG/ACT IN AERO: 2.0000 | INHALATION_SPRAY | RESPIRATORY_TRACT | 1 refills | Status: DC | PRN

## 2023-03-17 NOTE — Addendum Note (Signed)
Addended by: Rolland Bimler D on: 03/17/2023 04:15 PM   Modules accepted: Orders

## 2023-03-17 NOTE — Telephone Encounter (Signed)
Received fax from [facility]/[address] - DOB verified - advising the following:  Drug not covered by patient plan.  The preferred alternative:  Albuterol Sulfate HFA Ventolin HFA   Forwarding message to provider as update.

## 2023-03-17 NOTE — Telephone Encounter (Signed)
Called patient and informed her of the change in medication. Patient stated that she understood and would keep track of any symptoms if any. Pharmacy confirmed and medication sent.

## 2023-03-17 NOTE — Progress Notes (Signed)
Follow-up Note  RE: [name] MRN: [id number] DOB: February 24, 1941 Date of Office Visit: 03/17/2023   History of present illness: [name] is a 82 y.o. female presenting today for follow-up of cough, allergic rhinitis with last visit on 09/14/22 by myself.    Discussed the use of AI scribe software for clinical note transcription with the patient, who gave verbal consent to proceed. She reports a significant improvement in her cough since the last visit in spring. The patient uses Xopenex, a rescue inhaler, sparingly, approximately a couple of times a month, and finds it effective in relieving symptoms. She continues to use Symbicort 2 puffs twice a day and Singulair daily for asthma control.  She has not had any ED/UC visits or systemic steroids since last visit.   She is undergoing work-up for low WBC count (with luekopenia and neutropenia) and following with heme/onc.  She has had bone marrow evaluation.  She underwent imaging, which she states revealed a small hernia in the upper esophagus and a cyst in the liver. The patient denies any swallowing issues related to the esophageal hernia, provided she takes her time while eating.   The patient continues to experience voice changes and throat clearing. She has been using ipratropium as needed, azelastine more consistently, and fluticasone nasal sprays for these symptoms.  She does feel the ipratropium provides better control than the azelastine.      Review of systems: 10pt ROS negative unless noted above in HPI Past medical/social/surgical/family history have been reviewed and are unchanged unless specifically indicated below.  No changes  Medication List: Current Outpatient Medications  Medication Sig Dispense Refill   CALCIUM-MAGNESIUM-ZINC PO Take by mouth.     cholecalciferol (VITAMIN D3) 25 MCG (1000 UT) tablet Take 1,000 Units by mouth daily.     diclofenac sodium (VOLTAREN) 1 % GEL      Diclofenac Sodium 1.5 % SOLN  Place onto the skin. Reported on 08/26/2015     docusate sodium (COLACE) 100 MG capsule Take 100 mg by mouth daily as needed for mild constipation.     multivitamin-iron-minerals-folic acid (CENTRUM) chewable tablet Chew 1 tablet by mouth daily.     NEXLETOL 180 MG TABS Take 1 tablet by mouth daily.     olmesartan-hydrochlorothiazide (BENICAR HCT) 20-12.5 MG per tablet Take 1 tablet by mouth daily.     primidone (MYSOLINE) 50 MG tablet 4 in the AM, 1 po q hs 450 tablet 0   Carbinoxamine Maleate (RYVENT) 6 MG TABS Take 1 tablet (6 mg total) by mouth 2 (two) times daily. 120 tablet 2   fluticasone (FLONASE) 50 MCG/ACT nasal spray 1-2 sprays each nostril daily for 1-2 weeks at a time before stopping once nasal congestion improves for maximum benefit. 16 g 5   ipratropium (ATROVENT) 0.06 % nasal spray 2 sprays up to 3-4 times a day if needed for nasal drainage/throat clearing control. 15 mL 5   levalbuterol (XOPENEX HFA) 45 MCG/ACT inhaler Inhale 2 puffs into the lungs every 4 (four) hours as needed. 15 g 1   montelukast (SINGULAIR) 10 MG tablet Take 1 tablet (10 mg total) by mouth at bedtime. 90 tablet 1   SYMBICORT 160-4.5 MCG/ACT inhaler 1-2 puffs daily to prevent cough and wheeze. Increase to 2 puffs twice daily for 2 weeks during flares. 10.2 g 5   No current facility-administered medications for this visit.     Known medication allergies: Allergies  Allergen Reactions   Latex Shortness Of  Breath and Other (See Comments)     Physical examination: Blood pressure 138/80, pulse 77, temperature 98.4 F (36.9 C), temperature source Temporal, resp. rate 16, height 5' 3.75" (1.619 m), weight 169 lb 9.6 oz (76.9 kg), SpO2 99%.  General: Alert, interactive, in no acute distress. HEENT: PERRLA, TMs pearly gray, turbinates non-edematous without discharge, post-pharynx non erythematous. Neck: Supple without lymphadenopathy. Lungs: Clear to auscultation without wheezing, rhonchi or rales. {no  increased work of breathing. CV: Normal S1, S2 without murmurs. Abdomen: Nondistended, nontender. Skin: Warm and dry, without lesions or rashes. Extremities:  No clubbing, cyanosis or edema. Neuro:   Grossly intact.  Diagnositics/Labs: None today  Assessment and plan:   Cough/mod persistent asthma - improved Continue montelukast 10 mg once a day to prevent cough or wheeze Continue Symbicort 160-2 puffs 1-2 times a day to prevent cough or wheeze.  Rinse mouth after use.  Continue Xopenex 2 puffs once every 4-6 hours as needed for cough or wheeze For asthma flare, increase Symbicort 160 to 2 puffs twice a day with a spacer for 2 weeks or when cough and wheeze free then return to previous dosing  Allergic rhinitis Continue allergen avoidance measures Continue Flonase 1-2 sprays in each nostril daily for 1-2 weeks at a time before stopping once nasal congestion improves for maximum benefit Use Ipratropium 2 sprays up to 3-4 times a day if needed for nasal drainage/throat clearing control. Max use of 4 times a day as needed In the right nostril, point the applicator out toward the right ear. In the left nostril, point the applicator out toward the left ear Continue saline nasal rinses as needed for nasal symptoms. Use this before any medicated nasal sprays for best result Use RyVent (carbinoxamine) 6-8 mg twice a day to control nasal symptoms.     Follow up in 6 months or sooner if needed.  I appreciate the opportunity to take part in Jeanita's care. Please do not hesitate to contact me with questions.  Sincerely,   Margo Aye, MD Allergy/Immunology [facility]

## 2023-03-17 NOTE — Patient Instructions (Addendum)
Cough - improved Continue montelukast 10 mg once a day to prevent cough or wheeze Continue Symbicort 160-2 puffs 1-2 times a day to prevent cough or wheeze.  Rinse mouth after use.  Continue Xopenex 2 puffs once every 4-6 hours as needed for cough or wheeze For asthma flare, increase Symbicort 160 to 2 puffs twice a day with a spacer for 2 weeks or when cough and wheeze free then return to previous dosing  Allergic rhinitis Continue allergen avoidance measures Continue Flonase 1-2 sprays in each nostril daily for 1-2 weeks at a time before stopping once nasal congestion improves for maximum benefit Use Ipratropium 2 sprays up to 3-4 times a day if needed for nasal drainage/throat clearing control. Max use of 4 times a day as needed In the right nostril, point the applicator out toward the right ear. In the left nostril, point the applicator out toward the left ear Continue saline nasal rinses as needed for nasal symptoms. Use this before any medicated nasal sprays for best result Use RyVent (carbinoxamine) 6-8 mg twice a day to control nasal symptoms.     Follow up in 6 months or sooner if needed.

## 2023-03-20 ENCOUNTER — Encounter: Payer: Self-pay | Admitting: Orthopedic Surgery

## 2023-03-20 ENCOUNTER — Ambulatory Visit (INDEPENDENT_AMBULATORY_CARE_PROVIDER_SITE_OTHER): Payer: Medicare Other | Admitting: Orthopedic Surgery

## 2023-03-20 DIAGNOSIS — M1711 Unilateral primary osteoarthritis, right knee: Secondary | ICD-10-CM | POA: Diagnosis not present

## 2023-03-20 DIAGNOSIS — G8929 Other chronic pain: Secondary | ICD-10-CM

## 2023-03-20 DIAGNOSIS — M25562 Pain in left knee: Secondary | ICD-10-CM | POA: Diagnosis not present

## 2023-03-20 DIAGNOSIS — M5416 Radiculopathy, lumbar region: Secondary | ICD-10-CM

## 2023-03-20 DIAGNOSIS — M25561 Pain in right knee: Secondary | ICD-10-CM | POA: Diagnosis not present

## 2023-03-20 MED ORDER — METHYLPREDNISOLONE ACETATE 40 MG/ML IJ SUSP
40.0000 mg | INTRAMUSCULAR | Status: AC | PRN
Start: 2023-03-20 — End: 2023-03-20
  Administered 2023-03-20: 40 mg via INTRA_ARTICULAR

## 2023-03-20 MED ORDER — LIDOCAINE HCL (PF) 1 % IJ SOLN
5.0000 mL | INTRAMUSCULAR | Status: AC | PRN
Start: 2023-03-20 — End: 2023-03-20
  Administered 2023-03-20: 5 mL

## 2023-03-20 NOTE — Progress Notes (Signed)
Office Visit Note   Patient: [name]           Date of Birth: [birth year]           MRN: [id number] Visit Date: 03/20/2023              Requested by: Renaye Rakers, MD [address] [place],  [place] [address] PCP: Renaye Rakers, MD  Chief Complaint  Patient presents with   Left Hip - Pain   Right Knee - Pain      HPI: Patient is an 82 year old woman who presents with osteoarthritis both knees right worse than left.  Patient states she had about a years worth of relief with a gel injection for the right knee.  She states that this has flared up again.  Patient is also being followed with Dr. Alvester Morin for right-sided lumbar spine radicular pain.  She has also had a CT scan of the right hip with radicular right hip and groin pain.  Assessment & Plan: Visit Diagnoses:  1. Lumbar radiculopathy   2. Chronic pain of both knees     Plan: Right knee was injected with steroid.  If this provides temporary relief we would request authorization for a gel injection for the right knee.  Patient will continue with conservative treatment for the lumbar radicular pain and arthritis of her right hip.  Follow-Up Instructions: No follow-ups on file.   Ortho Exam  Patient is alert, oriented, no adenopathy, well-dressed, normal affect, normal respiratory effort. Examination patient ambulates with a cane.  She does not have pain with passive range of motion of the right hip but the CT scan does show some subchondral cyst with joint space narrowing with mild arthritic changes.  Examination the right knee she has a mild effusion collaterals and cruciates are stable there is tenderness to palpation of the medial and lateral joint line.  Imaging: No results found. No images are attached to the encounter.  Labs: No results found for: "HGBA1C", "ESRSEDRATE", "CRP", "LABURIC", "REPTSTATUS", "GRAMSTAIN", "CULT", "LABORGA"   No results found for: "ALBUMIN", "PREALBUMIN", "CBC"  No results  found for: "MG" No results found for: "VD25OH"  No results found for: "PREALBUMIN"    Latest Ref Rng & Units 11/09/2005    1:23 PM  CBC EXTENDED  WBC 3.9 - 10.0 10e3/uL 2.5   RBC 3.70 - 5.32 10e6/uL 4.61   Hemoglobin 11.6 - 15.9 g/dL 66.4   HCT 40.3 - 47.4 % 41.8   Platelets 145 - 400 10e3/uL 174   NEUT# 1.5 - 6.5 10e3/uL 0.8   Lymph# 0.9 - 3.3 10e3/uL 1.4      There is no height or weight on file to calculate BMI.  Orders:  No orders of the defined types were placed in this encounter.  No orders of the defined types were placed in this encounter.    Procedures: Large Joint Inj: R knee on 03/20/2023 11:58 AM Indications: pain and diagnostic evaluation Details: 22 G 1.5 in needle, anteromedial approach  Arthrogram: No  Medications: 5 mL lidocaine (PF) 1 %; 40 mg methylPREDNISolone acetate 40 MG/ML Outcome: tolerated well, no immediate complications Procedure, treatment alternatives, risks and benefits explained, specific risks discussed. Consent was given by the patient. Immediately prior to procedure a time out was called to verify the correct patient, procedure, equipment, support staff and site/side marked as required. Patient was prepped and draped in the usual sterile fashion.      Clinical Data: No  additional findings.  ROS:  All other systems negative, except as noted in the HPI. Review of Systems  Objective: Vital Signs: There were no vitals taken for this visit.  Specialty Comments:  No specialty comments available.  PMFS History: Patient Active Problem List   Diagnosis Date Noted   Arthritis of right hip 03/21/2022   Reactive airway disease 07/29/2020   Seasonal and perennial allergic rhinitis 07/29/2020   Gastroesophageal reflux disease 07/29/2020   Unilateral primary osteoarthritis, right knee 11/21/2017   Status post revision of total knee, left 11/21/2017   Chronic rhinitis 02/25/2016   Cough 02/25/2016   Hoarseness of voice 02/25/2016    Chronic leukopenia 12/15/2015   Past Medical History:  Diagnosis Date   Asthma    allergic induced   Cough    Hypertension    Rhinitis     Family History  Problem Relation Age of Onset   Other Mother        died when patient was 71 months old   Hypertension Father    Cerebral aneurysm Sister    Breast cancer Sister    Lung cancer Sister    Healthy Son    Allergic rhinitis Neg Hx    Angioedema Neg Hx    Asthma Neg Hx    Eczema Neg Hx    Immunodeficiency Neg Hx    Urticaria Neg Hx     Past Surgical History:  Procedure Laterality Date   ABDOMINAL HYSTERECTOMY     BACK SURGERY     CATARACT EXTRACTION     MASS EXCISION  01/05/2012   Procedure: MINOR EXCISION OF MASS;  Surgeon: Wyn Forster., MD;  Location: [facility];  Service: Orthopedics;  Laterality: Right;  excision mucoid cyst right long finger, debride DIP joint   ROTATOR CUFF REPAIR Bilateral    TOTAL KNEE ARTHROPLASTY Left    twice   Social History   Occupational History   Occupation: retired    Comment: Occupational hygienist  Tobacco Use   Smoking status: Never    Passive exposure: Never   Smokeless tobacco: Never  Vaping Use   Vaping status: Never Used  Substance and Sexual Activity   Alcohol use: No    Alcohol/week: 0.0 standard drinks of alcohol   Drug use: No   Sexual activity: Not on file    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

## 2023-04-13 DIAGNOSIS — D704 Cyclic neutropenia: Secondary | ICD-10-CM | POA: Diagnosis not present

## 2023-04-17 DIAGNOSIS — H6123 Impacted cerumen, bilateral: Secondary | ICD-10-CM | POA: Diagnosis not present

## 2023-04-17 DIAGNOSIS — H900 Conductive hearing loss, bilateral: Secondary | ICD-10-CM | POA: Diagnosis not present

## 2023-04-24 ENCOUNTER — Telehealth: Payer: Self-pay | Admitting: Allergy

## 2023-04-24 MED ORDER — CARBINOXAMINE MALEATE 6 MG PO TABS: 1.0000 | ORAL_TABLET | Freq: Two times a day (BID) | ORAL | 2 refills | Status: DC

## 2023-04-24 NOTE — Telephone Encounter (Signed)
Called and informed pt I'm sending Car[facility]amine into the [facility]

## 2023-04-24 NOTE — Telephone Encounter (Signed)
Patient calling asking for a refill on medication Carbinoxamine Maleate (RYVENT) 6 MG TABS [[id number]] please advise

## 2023-04-28 ENCOUNTER — Other Ambulatory Visit: Payer: Self-pay | Admitting: Allergy

## 2023-06-19 ENCOUNTER — Other Ambulatory Visit (INDEPENDENT_AMBULATORY_CARE_PROVIDER_SITE_OTHER): Payer: Self-pay

## 2023-06-19 ENCOUNTER — Ambulatory Visit (INDEPENDENT_AMBULATORY_CARE_PROVIDER_SITE_OTHER): Payer: Medicare Other | Admitting: Orthopedic Surgery

## 2023-06-19 DIAGNOSIS — M79642 Pain in left hand: Secondary | ICD-10-CM | POA: Diagnosis not present

## 2023-06-19 DIAGNOSIS — M25531 Pain in right wrist: Secondary | ICD-10-CM

## 2023-06-19 NOTE — Progress Notes (Signed)
[name] - 82 y.o. female MRN [id number]  Date of birth: Oct 20, 1940  Office Visit Note: Visit Date: 06/19/2023 PCP: Sonya Rakers, MD Referred by: Sonya Rakers, MD  Subjective: No chief complaint on file.  HPI: [name] is a pleasant 82 y.o. female who presents today for evaluation of ongoing right wrist pain that is diffuse in nature as well as left index DIP pain and deformity.  Symptoms on both sides are chronic in nature, have been present for multiple years.  She states that the pain has worsened in nature more recently particularly at the right wrist.  Pertinent ROS were reviewed with the patient and found to be negative unless otherwise specified above in HPI.   Visit Reason: left index DIP pain, right wrist pain Duration of symptoms:years Hand dominance: left Occupation: retired Diabetic: No Smoking: No Heart/Lung History:none Blood Thinners: none  Prior Testing/EMG:none Injections (Date): none Treatments:none Prior Surgery:none   Assessment & Plan: Visit Diagnoses:  1. Pain in right wrist   2. Pain in left hand     Plan: Extensive discussion was had with the patient today regarding her bilateral hand complaints.  For the right wrist, we reviewed her clinical and radiographic workup, she does have some degenerative change at the radiocarpal articulation with ulnar positivity and signs of impaction.  As for the left index finger, there is notable DIP arthritis seen both clinically and radiographically.  We discussed treatment measures ranging from conservative to surgical.  From a conservative standpoint, we discussed activity modification, bracing, cortisone injections, anti-inflammatory medication.  From a surgical standpoint, for the right wrist, we discussed in particular the ulnar positivity and the possible indication for ulnar shortening osteotomy in the future should her impaction syndrome grow more symptomatic.  As for the left index finger  DIP, we discussed the significant arthritis within the DIP joint and the potential need for DIP fusion in the future for pain control.  Risk and benefits of both operations were discussed in detail today.  Risk include but not limited to infection, bleeding, scarring, stiffness, nerve injury, vascular, tendon injury, hardware failure, risk of recurrence and need for subsequent operation.  For the time being, she would like to continue with conservative treat modalities given that she has not tried any formalized conservative treatment yet.  We will begin with a wrist brace to be to last as needed for the right side.  As for the left index finger, we can begin with compressive wrapping with Coban initially.  Should her symptoms remain refractory to conservative care, we could discuss potential cortisone injection particular for the right wrist in the future.  She expressed full understanding, will return as needed moving forward.  Greater than 30 minutes was spent reviewing previous documentation, imaging as well as in discussion and examination with the patient today.  Follow-up: No follow-ups on file.   Meds & Orders: No orders of the defined types were placed in this encounter.   Orders Placed This Encounter  Procedures   XR Hand Complete Left   XR Wrist Complete Right     Procedures: No procedures performed      Clinical History: No specialty comments available.  She reports that she has never smoked. She has never been exposed to tobacco smoke. She has never used smokeless tobacco. No results for input(s): "HGBA1C", "LABURIC" in the last 8760 hours.  Objective:   Vital Signs: There were no vitals taken for this visit.  Physical Exam  Gen: Well-appearing, in no acute distress; non-toxic CV: Regular Rate. Well-perfused. Warm.  Resp: Breathing unlabored on room air; no wheezing. Psych: Fluid speech in conversation; appropriate affect; normal thought process  Ortho Exam PHYSICAL  EXAM:  General: Patient is well appearing and in no distress. Cervical spine mobility is full in all directions:  Skin and Muscle: No significant skin changes are apparent to upper extremities.      Range of Motion and Palpation Tests: Mobility is full about the elbows with flexion and extension.  Forearm supination and pronation are 70/70 bilaterally.  Wrist flexion/extension is 60/55 bilaterally.  Digital flexion and extension are full.  Thumb opposition is full to the base of the small fingers bilaterally.    No triggering is appreciated with composite fist bilaterally.  Notable DIP nodules are seen particularly at the left index DIP..    Mild tenderness over the thumb CMC articulations is observed.  Scaphoid shift test is negative bilaterally.  Finklestein test is negative bilaterally.  Ulnar impingement test is moderately positive left side.  No evidence of radiocarpal, midcarpal or intercarpal joint instability with provocation .  Neurologic, Vascular, Motor: Sensation is intact to light touch in the median/radial/ulnar distributions.   Fingers pink and well perfused.  Capillary refill is brisk.      No results found for: "HGBA1C"   Imaging: XR Hand Complete Left Result Date: 06/19/2023 X-rays of the left hand show diffuse small joint arthritic changes, most notable at the index DIP and thumb IP joint with asymmetric joint space narrowing, osteophyte formation and subchondral sclerosis.  XR Wrist Complete Right Result Date: 06/19/2023 X-rays of the right wrist, multiple views were obtained today X-rays demonstrate degenerative changes at the radiocarpal interval with joint space narrowing.  There is notable ulnar positivity with some mild sclerotic changes at the lunate indicating potential impaction.   Past Medical/Family/Surgical/Social History: Medications & Allergies reviewed per EMR, new medications updated. Patient Active Problem List   Diagnosis Date Noted    Arthritis of right hip 03/21/2022   Reactive airway disease 07/29/2020   Seasonal and perennial allergic rhinitis 07/29/2020   Gastroesophageal reflux disease 07/29/2020   Unilateral primary osteoarthritis, right knee 11/21/2017   Status post revision of total knee, left 11/21/2017   Chronic rhinitis 02/25/2016   Cough 02/25/2016   Hoarseness of voice 02/25/2016   Chronic leukopenia 12/15/2015   Past Medical History:  Diagnosis Date   Asthma    allergic induced   Cough    Hypertension    Rhinitis    Family History  Problem Relation Age of Onset   Other Mother        died when patient was 66 months old   Hypertension Father    Cerebral aneurysm Sister    Breast cancer Sister    Lung cancer Sister    Healthy Son    Allergic rhinitis Neg Hx    Angioedema Neg Hx    Asthma Neg Hx    Eczema Neg Hx    Immunodeficiency Neg Hx    Urticaria Neg Hx    Past Surgical History:  Procedure Laterality Date   ABDOMINAL HYSTERECTOMY     BACK SURGERY     CATARACT EXTRACTION     MASS EXCISION  01/05/2012   Procedure: MINOR EXCISION OF MASS;  Surgeon: Wyn Forster., MD;  Location: [facility];  Service: Orthopedics;  Laterality: Right;  excision mucoid cyst right long finger, debride DIP joint   ROTATOR CUFF  REPAIR Bilateral    TOTAL KNEE ARTHROPLASTY Left    twice   Social History   Occupational History   Occupation: retired    Comment: Occupational hygienist  Tobacco Use   Smoking status: Never    Passive exposure: Never   Smokeless tobacco: Never  Vaping Use   Vaping status: Never Used  Substance and Sexual Activity   Alcohol use: No    Alcohol/week: 0.0 standard drinks of alcohol   Drug use: No   Sexual activity: Not on file    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    Karon Cotterill Trevor Mace, M.D. [facility] 7:48 PM

## 2023-06-20 DIAGNOSIS — I10 Essential (primary) hypertension: Secondary | ICD-10-CM | POA: Diagnosis not present

## 2023-06-20 DIAGNOSIS — E1169 Type 2 diabetes mellitus with other specified complication: Secondary | ICD-10-CM | POA: Diagnosis not present

## 2023-06-20 DIAGNOSIS — E785 Hyperlipidemia, unspecified: Secondary | ICD-10-CM | POA: Diagnosis not present

## 2023-06-23 DIAGNOSIS — E785 Hyperlipidemia, unspecified: Secondary | ICD-10-CM | POA: Diagnosis not present

## 2023-06-23 DIAGNOSIS — B358 Other dermatophytoses: Secondary | ICD-10-CM | POA: Diagnosis not present

## 2023-06-23 DIAGNOSIS — I10 Essential (primary) hypertension: Secondary | ICD-10-CM | POA: Diagnosis not present

## 2023-06-23 DIAGNOSIS — E1169 Type 2 diabetes mellitus with other specified complication: Secondary | ICD-10-CM | POA: Diagnosis not present

## 2023-06-23 DIAGNOSIS — M130 Polyarthritis, unspecified: Secondary | ICD-10-CM | POA: Diagnosis not present

## 2023-06-28 ENCOUNTER — Encounter: Payer: Self-pay | Admitting: Family

## 2023-06-28 ENCOUNTER — Ambulatory Visit: Payer: Medicare Other | Admitting: Family

## 2023-06-28 DIAGNOSIS — M1711 Unilateral primary osteoarthritis, right knee: Secondary | ICD-10-CM | POA: Diagnosis not present

## 2023-06-28 MED ORDER — LIDOCAINE HCL 1 % IJ SOLN
5.0000 mL | INTRAMUSCULAR | Status: AC | PRN
  Administered 2023-06-28: 5 mL

## 2023-06-28 MED ORDER — METHYLPREDNISOLONE ACETATE 40 MG/ML IJ SUSP
40.0000 mg | INTRAMUSCULAR | Status: AC | PRN
  Administered 2023-06-28: 40 mg via INTRA_ARTICULAR

## 2023-06-28 NOTE — Progress Notes (Signed)
 Office Visit Note   Patient: [name]           Date of Birth: [birth year]           MRN: [id number] Visit Date: 06/28/2023              Requested by: Benjamine Aland, MD [address] [place],  [place] [address] PCP: Benjamine Aland, MD  Chief Complaint  Patient presents with   Right Knee - Pain      HPI: The patient is an 83 year old woman who presents in follow-up.  She has been having issues with chronic knee pain osteoarthritis right knee.  Her last injection was September 30 she did obtain good relief until the last few weeks she has had return of her pain this stiffness posterior knee fullness night pain also has giving way Assessment & Plan: Visit Diagnoses: No diagnosis found.  Plan: Depo-Medrol  injection right knee.  Patient tolerated well.  Follow-Up Instructions: Return in about 3 months (around 09/26/2023), or if symptoms worsen or fail to improve.   Right Knee Exam   Muscle Strength  The patient has normal right knee strength.  Tenderness  The patient is experiencing tenderness in the medial joint line.  Range of Motion  The patient has normal right knee ROM.  Tests  Varus: negative Valgus: negative  Other  Swelling: moderate Effusion: effusion present      Patient is alert, oriented, no adenopathy, well-dressed, normal affect, normal respiratory effort.   Imaging: No results found. No images are attached to the encounter.  Labs: No results found for: HGBA1C, ESRSEDRATE, CRP, LABURIC, REPTSTATUS, GRAMSTAIN, CULT, LABORGA   No results found for: ALBUMIN, PREALBUMIN, CBC  No results found for: MG No results found for: VD25OH  No results found for: PREALBUMIN    Latest Ref Rng & Units 11/09/2005    1:23 PM  CBC EXTENDED  WBC 3.9 - 10.0 10e3/uL 2.5   RBC 3.70 - 5.32 10e6/uL 4.61   Hemoglobin 11.6 - 15.9 g/dL 85.8   HCT 65.1 - 53.3 % 41.8   Platelets 145 - 400 10e3/uL 174   NEUT# 1.5 - 6.5 10e3/uL 0.8    Lymph# 0.9 - 3.3 10e3/uL 1.4      There is no height or weight on file to calculate BMI.  Orders:  No orders of the defined types were placed in this encounter.  No orders of the defined types were placed in this encounter.    Procedures: Large Joint Inj: R knee on 06/28/2023 9:08 AM Indications: pain Details: 18 G 1.5 in needle, anteromedial approach Medications: 5 mL lidocaine  1 %; 40 mg methylPREDNISolone  acetate 40 MG/ML Consent was given by the patient.      Clinical Data: No additional findings.  ROS:  All other systems negative, except as noted in the HPI. Review of Systems  Objective: Vital Signs: There were no vitals taken for this visit.  Specialty Comments:  No specialty comments available.  PMFS History: Patient Active Problem List   Diagnosis Date Noted   Arthritis of right hip 03/21/2022   Reactive airway disease 07/29/2020   Seasonal and perennial allergic rhinitis 07/29/2020   Gastroesophageal reflux disease 07/29/2020   Unilateral primary osteoarthritis, right knee 11/21/2017   Status post revision of total knee, left 11/21/2017   Chronic rhinitis 02/25/2016   Cough 02/25/2016   Hoarseness of voice 02/25/2016   Chronic leukopenia 12/15/2015   Past Medical History:  Diagnosis Date   Asthma  allergic induced   Cough    Hypertension    Rhinitis     Family History  Problem Relation Age of Onset   Other Mother        died when patient was 14 months old   Hypertension Father    Cerebral aneurysm Sister    Breast cancer Sister    Lung cancer Sister    Healthy Son    Allergic rhinitis Neg Hx    Angioedema Neg Hx    Asthma Neg Hx    Eczema Neg Hx    Immunodeficiency Neg Hx    Urticaria Neg Hx     Past Surgical History:  Procedure Laterality Date   ABDOMINAL HYSTERECTOMY     BACK SURGERY     CATARACT EXTRACTION     MASS EXCISION  01/05/2012   Procedure: MINOR EXCISION OF MASS;  Surgeon: Lamar LULLA Leonor Mickey., MD;  Location: [facility];  Service: Orthopedics;  Laterality: Right;  excision mucoid cyst right long finger, debride DIP joint   ROTATOR CUFF REPAIR Bilateral    TOTAL KNEE ARTHROPLASTY Left    twice   Social History   Occupational History   Occupation: retired    Comment: occupational hygienist  Tobacco Use   Smoking status: Never    Passive exposure: Never   Smokeless tobacco: Never  Vaping Use   Vaping status: Never Used  Substance and Sexual Activity   Alcohol  use: No    Alcohol /week: 0.0 standard drinks of alcohol    Drug use: No   Sexual activity: Not on file    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

## 2023-07-17 ENCOUNTER — Telehealth: Payer: Self-pay

## 2023-07-17 NOTE — Telephone Encounter (Signed)
Patient called in - DOB verified - stated since last Friday, 07/14/23 - when she blows her nose, she has a little bit of blood mixed in with the mucous.  Reviewed 03/17/23 office notes per Dr. Delorse Lek w/patient:  Allergic rhinitis Continue allergen avoidance measures Continue Flonase 1-2 sprays in each nostril daily for 1-2 weeks at a time before stopping once nasal congestion improves for maximum benefit Use Ipratropium 2 sprays up to 3-4 times a day if needed for nasal drainage/throat clearing control. Max use of 4 times a day as needed In the right nostril, point the applicator out toward the right ear. In the left nostril, point the applicator out toward the left ear Continue saline nasal rinses as needed for nasal symptoms. Use this before any medicated nasal sprays for best result Use RyVent (carbinoxamine) 6-8 mg twice a day to control nasal symptoms.    Patient stated she will began the nasal saline rinses - advised if she's noticing more blood or just not getting better - to contact the to schedule a sooner appt.  Patient verbalized understanding to all, no further questions.

## 2023-07-25 ENCOUNTER — Other Ambulatory Visit (HOSPITAL_COMMUNITY): Payer: Self-pay

## 2023-07-25 ENCOUNTER — Telehealth: Payer: Self-pay

## 2023-07-25 NOTE — Telephone Encounter (Signed)
 Pharmacy Patient Advocate Encounter   Received notification from CoverMyMeds that prior authorization for RyVent  6MG  tablets is required/requested.   Insurance verification completed.   The patient is insured through [facility] Medicare Part D.   Per test claim: PA required; PA submitted to above mentioned insurance via CoverMyMeds Key/confirmation #/EOC [id number] Status is pending

## 2023-07-26 NOTE — Telephone Encounter (Signed)
 Tavi from [employer] prior authorization team called to see if our provider Risk vs benefit   [phone] [facility] called  Tavi

## 2023-07-27 NOTE — Telephone Encounter (Signed)
 Called [employer] PA Dept.[phone]) - spoke to Bessie. PA Specialist - DOB verified - advised provider is aware of risk factors of medication regarding patient's age.  Odella stated she will be sending the PA to the review board for review - 24 - 48 hr turnaround time - we will receive a fax regarding their decision.  PA Case In: [place] - [id number]

## 2023-07-27 NOTE — Telephone Encounter (Signed)
 Jasmine with [employer] called back regarding the issue below.

## 2023-07-28 NOTE — Telephone Encounter (Signed)
 Pharmacy Patient Advocate Encounter  Received notification from [employer] Medicare Part D that Prior Authorization for RyVent  6MG  tablets has been APPROVED from 07-25-2023 to 07-24-2025   PA #/Case ID/Reference #: [id number]

## 2023-07-28 NOTE — Telephone Encounter (Signed)
 Called and spoke to patient and informed her of the RyVent  approval. Patient verbalized understanding and agreed to pick up the medication from her pharmacy.

## 2023-08-14 ENCOUNTER — Telehealth: Payer: Self-pay | Admitting: Orthopedic Surgery

## 2023-08-14 NOTE — Telephone Encounter (Signed)
 Patient request something for pain, appointment not until 08/16/23.    [facility] [address]

## 2023-08-14 NOTE — Progress Notes (Unsigned)
 Assessment/Plan:    1.  Essential Tremor  -continue primidone, 50 mg, 4 tablets in the morning and 1 tablets at night.    2.  Leukopenia  -Follows with hematology.  Has had bone marrow biopsy.  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  She was last seen October, 2024.  3.  LBP -Is chronic, but having an acute flareup.  Has an appointment later this afternoon with orthopedics   4.  F/u 1 year  Subjective:   [name] was seen today in follow up for essential tremor.  Records reviewed prior to today's visit.  Patient was last seen 1 year ago.  She remains on primidone, a total of 5 tablets daily.  She is stable from that point.  She only notes it when writing but otherwise does well, including with eating.  Having back pain that is flaring up and has appt later today with Ortho.    Current prescribed movement disorder medications: primidone 50 mg, 4 tablets in the morning, 1 tablet at night     ALLERGIES:   Allergies  Allergen Reactions   Latex Shortness Of Breath and Other (See Comments)    CURRENT MEDICATIONS:  Outpatient Encounter Medications as of 08/16/2023  Medication Sig   albuterol (VENTOLIN HFA) 108 (90 Base) MCG/ACT inhaler Inhale 2 puffs into the lungs every 6 (six) hours as needed for wheezing or shortness of breath.   CALCIUM-MAGNESIUM-ZINC PO Take by mouth.   cholecalciferol (VITAMIN D3) 25 MCG (1000 UT) tablet Take 1,000 Units by mouth daily.   docusate sodium (COLACE) 100 MG capsule Take 100 mg by mouth daily as needed for mild constipation.   fluticasone (FLONASE) 50 MCG/ACT nasal spray 1-2 sprays each nostril daily for 1-2 weeks at a time before stopping once nasal congestion improves for maximum benefit.   ipratropium (ATROVENT) 0.06 % nasal spray 2 sprays up to 3-4 times a day if needed for nasal drainage/throat clearing control.   levalbuterol (XOPENEX HFA) 45 MCG/ACT inhaler Inhale 2 puffs into the lungs every 4 (four)  hours as needed.   montelukast (SINGULAIR) 10 MG tablet Take 1 tablet (10 mg total) by mouth at bedtime.   multivitamin-iron-minerals-folic acid (CENTRUM) chewable tablet Chew 1 tablet by mouth daily.   NEXLETOL 180 MG TABS Take 1 tablet by mouth daily.   olmesartan-hydrochlorothiazide (BENICAR HCT) 20-12.5 MG per tablet Take 1 tablet by mouth daily.   primidone (MYSOLINE) 50 MG tablet 4 in the AM, 1 po q hs   RYVENT 6 MG TABS TAKE 1 TABLET BY MOUTH TWICE DAILY   SYMBICORT 160-4.5 MCG/ACT inhaler 1-2 puffs daily to prevent cough and wheeze. Increase to 2 puffs twice daily for 2 weeks during flares.   No facility-administered encounter medications on file as of 08/16/2023.     Objective:    PHYSICAL EXAMINATION:    VITALS:   Vitals:   08/16/23 0752  BP: 118/62  Pulse: 79  SpO2: 98%  Weight: 166 lb 12.8 oz (75.7 kg)  Height: 5' 5.5" (1.664 m)    GEN:  The patient appears stated age and is in NAD. HEENT:  Normocephalic, atraumatic.    Neurological examination:  Orientation: The patient is alert and oriented x3. Cranial nerves: There is good facial symmetry. The speech is fluent and clear. Soft palate rises symmetrically and there is no tongue deviation. Hearing is intact to conversational tone. Sensation: Sensation is intact to light touch throughout Motor: Strength is at least antigravity  x4.  Movement examination: Tone: There is normal tone in the UE/LE Abnormal movements: there is no rest tremor.  No postural tremor.  No significant intention tremor but does have trouble with archimedes spirals on the L, even getting the pen on the paper  Coordination:  There is no decremation with RAM's Gait and Station: She is clearly uncomfortable with back pain.  She is slow to rise.  The patient has an antalgic gait.  She drags the left leg.  This is same as last visit with ambulation, but has more back pain.    Cc:  Renaye Rakers, MD

## 2023-08-16 ENCOUNTER — Ambulatory Visit: Payer: Medicare Other | Admitting: Family

## 2023-08-16 ENCOUNTER — Other Ambulatory Visit (INDEPENDENT_AMBULATORY_CARE_PROVIDER_SITE_OTHER): Payer: Self-pay

## 2023-08-16 ENCOUNTER — Ambulatory Visit (INDEPENDENT_AMBULATORY_CARE_PROVIDER_SITE_OTHER): Payer: Medicare Other | Admitting: Neurology

## 2023-08-16 ENCOUNTER — Encounter: Payer: Self-pay | Admitting: Neurology

## 2023-08-16 VITALS — BP 118/62 | HR 79 | Ht 65.5 in | Wt 166.8 lb

## 2023-08-16 DIAGNOSIS — R0781 Pleurodynia: Secondary | ICD-10-CM

## 2023-08-16 DIAGNOSIS — G250 Essential tremor: Secondary | ICD-10-CM

## 2023-08-16 DIAGNOSIS — M549 Dorsalgia, unspecified: Secondary | ICD-10-CM

## 2023-08-16 DIAGNOSIS — M545 Low back pain, unspecified: Secondary | ICD-10-CM

## 2023-08-16 MED ORDER — METHOCARBAMOL 500 MG PO TABS: 500.0000 mg | ORAL_TABLET | Freq: Four times a day (QID) | ORAL | 0 refills | Status: DC | PRN

## 2023-08-16 MED ORDER — PREDNISONE 50 MG PO TABS: ORAL_TABLET | ORAL | 0 refills | Status: DC

## 2023-08-16 MED ORDER — PRIMIDONE 50 MG PO TABS: ORAL_TABLET | ORAL | 3 refills | Status: DC

## 2023-08-16 MED ORDER — LIDOCAINE 5 % EX PTCH: 1.0000 | MEDICATED_PATCH | CUTANEOUS | 0 refills | Status: DC

## 2023-08-16 MED ORDER — HYDROCODONE-ACETAMINOPHEN 5-325 MG PO TABS
1.0000 | ORAL_TABLET | Freq: Four times a day (QID) | ORAL | 0 refills | Status: DC | PRN
Start: 2023-08-16 — End: 2023-08-30

## 2023-08-16 NOTE — Progress Notes (Signed)
 Office Visit Note   Patient: [name]           Date of Birth: 06/05/1941           MRN: [id number] Visit Date: 08/16/2023              Requested by: Renaye Rakers, MD [address] [place],  [place] [address] PCP: Renaye Rakers, MD  Chief Complaint  Patient presents with   Lower Back - Pain   Right Knee - Pain      HPI: The patient is an 83 year old woman who presents today concern for mid back pain as well as some right flank pain after pulling in her trash can about a week ago when she felt immediate onset of mid back and right sided pain she had to stop and was unable to continue rolling her trash can.  She has had significant discomfort since difficulty with mobility difficulty sleeping due to pain.  No numbness tingling no weakness no red flag symptoms  Assessment & Plan: Visit Diagnoses:  1. Mid-back pain, acute   2. Rib pain on right side     Plan: Have discussed using over-the-counter lidocaine patches anti-inflammatories also send of course of prednisone.  And a short-term course of narcotic pain medication.  Muscle relaxer.  She is concerned for pathology in the back we will proceed with MRI scan of her thoracic spine  Follow-Up Instructions: No follow-ups on file.   Back Exam   Tenderness  The patient is experiencing tenderness in the lumbar and thoracic.  Muscle Strength  The patient has normal back strength.  Tests  Straight leg raise right: negative Straight leg raise left: negative  Other  Gait: abnormal   Comments:  Paraspinal tenderness as well      Patient is alert, oriented, no adenopathy, well-dressed, normal affect, normal respiratory effort.   Imaging: No results found. No images are attached to the encounter.  Labs: No results found for: "HGBA1C", "ESRSEDRATE", "CRP", "LABURIC", "REPTSTATUS", "GRAMSTAIN", "CULT", "LABORGA"   No results found for: "ALBUMIN", "PREALBUMIN", "CBC"  No results found for: "MG" No results  found for: "VD25OH"  No results found for: "PREALBUMIN"    Latest Ref Rng & Units 11/09/2005    1:23 PM  CBC EXTENDED  WBC 3.9 - 10.0 10e3/uL 2.5   RBC 3.70 - 5.32 10e6/uL 4.61   Hemoglobin 11.6 - 15.9 g/dL 84.6   HCT 96.2 - 95.2 % 41.8   Platelets 145 - 400 10e3/uL 174   NEUT# 1.5 - 6.5 10e3/uL 0.8   Lymph# 0.9 - 3.3 10e3/uL 1.4      There is no height or weight on file to calculate BMI.  Orders:  Orders Placed This Encounter  Procedures   XR Ribs Unilateral Right   XR Thoracic Spine 2 View   No orders of the defined types were placed in this encounter.    Procedures: No procedures performed  Clinical Data: No additional findings.  ROS:  All other systems negative, except as noted in the HPI. Review of Systems  Objective: Vital Signs: There were no vitals taken for this visit.  Specialty Comments:  No specialty comments available.  PMFS History: Patient Active Problem List   Diagnosis Date Noted   Arthritis of right hip 03/21/2022   Reactive airway disease 07/29/2020   Seasonal and perennial allergic rhinitis 07/29/2020   Gastroesophageal reflux disease 07/29/2020   Unilateral primary osteoarthritis, right knee 11/21/2017   Status post revision  of total knee, left 11/21/2017   Chronic rhinitis 02/25/2016   Cough 02/25/2016   Hoarseness of voice 02/25/2016   Chronic leukopenia 12/15/2015   Past Medical History:  Diagnosis Date   Asthma    allergic induced   Cough    Hypertension    Rhinitis     Family History  Problem Relation Age of Onset   Other Mother        died when patient was 57 months old   Hypertension Father    Cerebral aneurysm Sister    Breast cancer Sister    Lung cancer Sister    Healthy Son    Allergic rhinitis Neg Hx    Angioedema Neg Hx    Asthma Neg Hx    Eczema Neg Hx    Immunodeficiency Neg Hx    Urticaria Neg Hx     Past Surgical History:  Procedure Laterality Date   ABDOMINAL HYSTERECTOMY     BACK SURGERY      CATARACT EXTRACTION     MASS EXCISION  01/05/2012   Procedure: MINOR EXCISION OF MASS;  Surgeon: Wyn Forster., MD;  Location: [facility];  Service: Orthopedics;  Laterality: Right;  excision mucoid cyst right long finger, debride DIP joint   ROTATOR CUFF REPAIR Bilateral    TOTAL KNEE ARTHROPLASTY Left    twice   Social History   Occupational History   Occupation: retired    Comment: Occupational hygienist  Tobacco Use   Smoking status: Never    Passive exposure: Never   Smokeless tobacco: Never  Vaping Use   Vaping status: Never Used  Substance and Sexual Activity   Alcohol use: No    Alcohol/week: 0.0 standard drinks of alcohol   Drug use: No   Sexual activity: Not on file    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

## 2023-08-21 ENCOUNTER — Ambulatory Visit
Admission: RE | Admit: 2023-08-21 | Discharge: 2023-08-21 | Disposition: A | Discharge status: Home / Self Care | Payer: Medicare Other | Source: Ambulatory Visit | Attending: Family | Admitting: Family

## 2023-08-21 DIAGNOSIS — M549 Dorsalgia, unspecified: Secondary | ICD-10-CM

## 2023-08-21 DIAGNOSIS — M546 Pain in thoracic spine: Secondary | ICD-10-CM | POA: Diagnosis not present

## 2023-08-21 DIAGNOSIS — R0781 Pleurodynia: Secondary | ICD-10-CM

## 2023-08-30 ENCOUNTER — Encounter: Payer: Self-pay | Admitting: Family

## 2023-08-30 ENCOUNTER — Ambulatory Visit (INDEPENDENT_AMBULATORY_CARE_PROVIDER_SITE_OTHER): Admitting: Family

## 2023-08-30 DIAGNOSIS — M549 Dorsalgia, unspecified: Secondary | ICD-10-CM

## 2023-08-30 DIAGNOSIS — R0781 Pleurodynia: Secondary | ICD-10-CM | POA: Diagnosis not present

## 2023-08-30 MED ORDER — METHOCARBAMOL 500 MG PO TABS: 500.0000 mg | ORAL_TABLET | Freq: Four times a day (QID) | ORAL | 0 refills | Status: DC | PRN

## 2023-08-30 MED ORDER — TRAMADOL HCL 50 MG PO TABS: 50.0000 mg | ORAL_TABLET | Freq: Four times a day (QID) | ORAL | 0 refills | Status: DC | PRN

## 2023-08-30 NOTE — Progress Notes (Signed)
 Office Visit Note   Patient: [name]           Date of Birth: 1941/06/15           MRN: [id number] Visit Date: 08/30/2023              Requested by: Renaye Rakers, MD [address] [place],  [place] [address] PCP: Renaye Rakers, MD  Chief Complaint  Patient presents with   Middle Back - Follow-up      HPI: The patient is an 83 year old woman who presents today in follow-up for mid back pain which radiates to the right flank she has been having very gradual improvement since last visit.  She did receive some relief with the muscle relaxer but unfortunately has run out.  She has been having some difficulty sleeping due to pain  Overall feels she is improving  No shortness of breath no difficulty with breathing  Assessment & Plan: Visit Diagnoses: No diagnosis found.  Plan: Will refill her Robaxin and provide her prescription for tramadol.  She will continue with heating pads anti-inflammatories.  Work on stretching.  Discussed if she does not improve significantly in the next 2 to 4 weeks could consider physical therapy.  She would like to defer this at this time  Follow-Up Instructions: No follow-ups on file.   Back Exam   Tenderness  The patient is experiencing no tenderness.   Range of Motion  The patient has normal back ROM.  Muscle Strength  The patient has normal back strength.  Other  Gait: normal       Patient is alert, oriented, no adenopathy, well-dressed, normal affect, normal respiratory effort. Ambulates with a straight cane.  Soft tissue tenderness right sided paraspinal  Imaging: No results found. No images are attached to the encounter.  IMPRESSION: 1. Stable thoracic spine when compared to 09/05/2022. There is generalized thoracic spine degeneration without focal or high-grade stenosis. 2. Known high-grade spinal stenosis at L2-3, present since at least 2020. Labs: No results found for: "HGBA1C", "ESRSEDRATE", "CRP", "LABURIC",  "REPTSTATUS", "GRAMSTAIN", "CULT", "LABORGA"   No results found for: "ALBUMIN", "PREALBUMIN", "CBC"  No results found for: "MG" No results found for: "VD25OH"  No results found for: "PREALBUMIN"    Latest Ref Rng & Units 11/09/2005    1:23 PM  CBC EXTENDED  WBC 3.9 - 10.0 10e3/uL 2.5   RBC 3.70 - 5.32 10e6/uL 4.61   Hemoglobin 11.6 - 15.9 g/dL 19.1   HCT 47.8 - 29.5 % 41.8   Platelets 145 - 400 10e3/uL 174   NEUT# 1.5 - 6.5 10e3/uL 0.8   Lymph# 0.9 - 3.3 10e3/uL 1.4      There is no height or weight on file to calculate BMI.  Orders:  No orders of the defined types were placed in this encounter.  No orders of the defined types were placed in this encounter.    Procedures: No procedures performed  Clinical Data: No additional findings.  ROS:  All other systems negative, except as noted in the HPI. Review of Systems  Objective: Vital Signs: There were no vitals taken for this visit.  Specialty Comments:  No specialty comments available.  PMFS History: Patient Active Problem List   Diagnosis Date Noted   Arthritis of right hip 03/21/2022   Reactive airway disease 07/29/2020   Seasonal and perennial allergic rhinitis 07/29/2020   Gastroesophageal reflux disease 07/29/2020   Unilateral primary osteoarthritis, right knee 11/21/2017   Status post  revision of total knee, left 11/21/2017   Chronic rhinitis 02/25/2016   Cough 02/25/2016   Hoarseness of voice 02/25/2016   Chronic leukopenia 12/15/2015   Past Medical History:  Diagnosis Date   Asthma    allergic induced   Cough    Hypertension    Rhinitis     Family History  Problem Relation Age of Onset   Other Mother        died when patient was 33 months old   Hypertension Father    Cerebral aneurysm Sister    Breast cancer Sister    Lung cancer Sister    Healthy Son    Allergic rhinitis Neg Hx    Angioedema Neg Hx    Asthma Neg Hx    Eczema Neg Hx    Immunodeficiency Neg Hx    Urticaria Neg  Hx     Past Surgical History:  Procedure Laterality Date   ABDOMINAL HYSTERECTOMY     BACK SURGERY     CATARACT EXTRACTION     MASS EXCISION  01/05/2012   Procedure: MINOR EXCISION OF MASS;  Surgeon: Wyn Forster., MD;  Location: [facility];  Service: Orthopedics;  Laterality: Right;  excision mucoid cyst right long finger, debride DIP joint   ROTATOR CUFF REPAIR Bilateral    TOTAL KNEE ARTHROPLASTY Left    twice   Social History   Occupational History   Occupation: retired    Comment: Occupational hygienist  Tobacco Use   Smoking status: Never    Passive exposure: Never   Smokeless tobacco: Never  Vaping Use   Vaping status: Never Used  Substance and Sexual Activity   Alcohol use: No    Alcohol/week: 0.0 standard drinks of alcohol   Drug use: No   Sexual activity: Not on file    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

## 2023-08-31 ENCOUNTER — Telehealth: Payer: Self-pay

## 2023-08-31 NOTE — Telephone Encounter (Signed)
-----   Message from Adonis Huguenin sent at 08/16/2023  2:39 PM EST ----- Sorry, this one ----- Message ----- From: Henrine Screws, RMA Sent: 08/16/2023  10:01 AM EST To: Adonis Huguenin, NP  For what patient? ----- Message ----- From: Adonis Huguenin, NP Sent: 08/16/2023   9:42 AM EST To: Katniss Weedman, RMA  Would you mind doing PA for supp injefction ri knee

## 2023-08-31 NOTE — Telephone Encounter (Signed)
 VOB has been submitted for monovisc, right knee.

## 2023-09-01 ENCOUNTER — Encounter: Payer: Self-pay | Admitting: Family

## 2023-09-14 ENCOUNTER — Ambulatory Visit (INDEPENDENT_AMBULATORY_CARE_PROVIDER_SITE_OTHER): Payer: Medicare Other | Admitting: Allergy

## 2023-09-14 ENCOUNTER — Other Ambulatory Visit: Payer: Self-pay

## 2023-09-14 ENCOUNTER — Encounter: Payer: Self-pay | Admitting: Allergy

## 2023-09-14 VITALS — BP 130/68 | HR 66 | Temp 97.6°F | Resp 18 | Ht 64.0 in | Wt 171.1 lb

## 2023-09-14 DIAGNOSIS — J4540 Moderate persistent asthma, uncomplicated: Secondary | ICD-10-CM | POA: Diagnosis not present

## 2023-09-14 DIAGNOSIS — J3089 Other allergic rhinitis: Secondary | ICD-10-CM | POA: Diagnosis not present

## 2023-09-14 DIAGNOSIS — J302 Other seasonal allergic rhinitis: Secondary | ICD-10-CM | POA: Diagnosis not present

## 2023-09-14 MED ORDER — CARBINOXAMINE MALEATE 6 MG PO TABS: 1.0000 | ORAL_TABLET | Freq: Two times a day (BID) | ORAL | 2 refills | Status: DC

## 2023-09-14 MED ORDER — LEVALBUTEROL TARTRATE 45 MCG/ACT IN AERO: 2.0000 | INHALATION_SPRAY | RESPIRATORY_TRACT | 1 refills | Status: DC | PRN

## 2023-09-14 MED ORDER — SYMBICORT 160-4.5 MCG/ACT IN AERO: INHALATION_SPRAY | RESPIRATORY_TRACT | 5 refills | Status: DC

## 2023-09-14 MED ORDER — FLUTICASONE PROPIONATE 50 MCG/ACT NA SUSP: NASAL | 5 refills | Status: DC

## 2023-09-14 MED ORDER — IPRATROPIUM BROMIDE 0.06 % NA SOLN: NASAL | 5 refills | Status: DC

## 2023-09-14 MED ORDER — MONTELUKAST SODIUM 10 MG PO TABS: 10.0000 mg | ORAL_TABLET | Freq: Every day | ORAL | 1 refills | Status: DC

## 2023-09-14 NOTE — Patient Instructions (Addendum)
 Cough Continue montelukast 10 mg once a day to prevent cough or wheeze Continue Symbicort 160- use 2 puffs 1 time a day to prevent cough or wheeze.  Use with spacer. Rinse mouth after use.  Continue Xopenex 2 puffs once every 4-6 hours as needed for cough or wheeze For asthma flare or respiratory illness, increase Symbicort 160 to 2 puffs twice a day with a spacer for 2 weeks or when cough and wheeze free then return to previous dosing  Allergic rhinitis Continue allergen avoidance measures Continue Flonase 1-2 sprays in each nostril daily for 1-2 weeks at a time before stopping once nasal congestion improves for maximum benefit Use Ipratropium 2 sprays up to 3-4 times a day if needed for nasal drainage/throat clearing control. Max use of 4 times a day as needed In the right nostril, point the applicator out toward the right ear. In the left nostril, point the applicator out toward the left ear Continue saline nasal rinses as needed for nasal symptoms. Use this before any medicated nasal sprays for best result Use RyVent (carbinoxamine) 6-8 mg twice a day to control nasal symptoms.     Follow up in 6 months or sooner if needed.

## 2023-09-14 NOTE — Progress Notes (Signed)
 Follow-up Note  RE: [name] MRN: [id number] DOB: 07-19-1940 Date of Office Visit: 09/14/2023   History of present illness: [name] is a 83 y.o. female presenting today for follow-up of allergic rhinitis, asthma.  She was last seen in the office on 03/17/23 by myself. Discussed the use of AI scribe software for clinical note transcription with the patient, who gave verbal consent to proceed.  She uses her rescue inhaler approximately twice a week, primarily due to coughing, and finds it effective in alleviating her symptoms. She has not experienced significant breathing issues since the fall. Her maintenance inhaler, Symbicort, is used 2 puffs once daily.  She has not had any ED/UC visits or systemic steroids since last visit.   She takes montelukast daily. She uses ipratropium nasal spray twice daily, which she finds effective. Ryvent is taken in the morning and evening and is effective for her symptoms.  She recalls past allergy testing indicating sensitivities to perfume, flowers, and latex, and avoids these triggers.     Review of systems: 10pt ROS negative unless noted above in HPI   Past medical/social/surgical/family history have been reviewed and are unchanged unless specifically indicated below.  No changes  Medication List: Current Outpatient Medications  Medication Sig Dispense Refill   albuterol (VENTOLIN HFA) 108 (90 Base) MCG/ACT inhaler Inhale 2 puffs into the lungs every 6 (six) hours as needed for wheezing or shortness of breath. 18 g 1   CALCIUM-MAGNESIUM-ZINC PO Take by mouth.     cholecalciferol (VITAMIN D3) 25 MCG (1000 UT) tablet Take 1,000 Units by mouth daily.     docusate sodium (COLACE) 100 MG capsule Take 100 mg by mouth daily as needed for mild constipation.     fluticasone (FLONASE) 50 MCG/ACT nasal spray 1-2 sprays each nostril daily for 1-2 weeks at a time before stopping once nasal congestion improves for maximum benefit. 16 g 5    ipratropium (ATROVENT) 0.06 % nasal spray 2 sprays up to 3-4 times a day if needed for nasal drainage/throat clearing control. 15 mL 5   levalbuterol (XOPENEX HFA) 45 MCG/ACT inhaler Inhale 2 puffs into the lungs every 4 (four) hours as needed. 15 g 1   methocarbamol (ROBAXIN) 500 MG tablet Take 1 tablet (500 mg total) by mouth every 6 (six) hours as needed for muscle spasms. 30 tablet 0   montelukast (SINGULAIR) 10 MG tablet Take 1 tablet (10 mg total) by mouth at bedtime. 90 tablet 1   multivitamin-iron-minerals-folic acid (CENTRUM) chewable tablet Chew 1 tablet by mouth daily.     NEXLETOL 180 MG TABS Take 1 tablet by mouth daily.     olmesartan-hydrochlorothiazide (BENICAR HCT) 20-12.5 MG per tablet Take 1 tablet by mouth daily.     primidone (MYSOLINE) 50 MG tablet 4 in the AM, 1 po q hs 450 tablet 3   RYVENT 6 MG TABS TAKE 1 TABLET BY MOUTH TWICE DAILY 120 tablet 2   SYMBICORT 160-4.5 MCG/ACT inhaler 1-2 puffs daily to prevent cough and wheeze. Increase to 2 puffs twice daily for 2 weeks during flares. 10.2 g 5   traMADol (ULTRAM) 50 MG tablet Take 1 tablet (50 mg total) by mouth every 6 (six) hours as needed. 30 tablet 0   No current facility-administered medications for this visit.     Known medication allergies: Allergies  Allergen Reactions   Latex Shortness Of Breath and Other (See Comments)     Physical examination: Blood pressure 130/68, pulse 66,  temperature 97.6 F (36.4 C), temperature source Temporal, resp. rate 18, height 5\' 4"  (1.626 m), weight 171 lb 1.6 oz (77.6 kg), SpO2 97%.  General: Alert, interactive, in no acute distress. HEENT: PERRLA, TMs pearly gray, turbinates non-edematous without discharge, post-pharynx non erythematous. Neck: Supple without lymphadenopathy. Lungs: Clear to auscultation without wheezing, rhonchi or rales. {no increased work of breathing. CV: Normal S1, S2 without murmurs. Abdomen: Nondistended, nontender. Skin: Warm and dry, without  lesions or rashes. Extremities:  No clubbing, cyanosis or edema. Neuro:   Grossly intact.  Diagnositics/Labs:  Spirometry: FEV1: 1.69L 103%, FVC: 3.3L 153%, ratio consistent with this is a nonobstructive pattern  Assessment and plan: Cough Continue montelukast 10 mg once a day to prevent cough or wheeze Continue Symbicort 160- use 2 puffs 1 time a day to prevent cough or wheeze.  Use with spacer. Rinse mouth after use.  Continue Xopenex 2 puffs once every 4-6 hours as needed for cough or wheeze For asthma flare or respiratory illness, increase Symbicort 160 to 2 puffs twice a day with a spacer for 2 weeks or when cough and wheeze free then return to previous dosing  Allergic rhinitis Continue allergen avoidance measures Continue Flonase 1-2 sprays in each nostril daily for 1-2 weeks at a time before stopping once nasal congestion improves for maximum benefit Use Ipratropium 2 sprays up to 3-4 times a day if needed for nasal drainage/throat clearing control. Max use of 4 times a day as needed In the right nostril, point the applicator out toward the right ear. In the left nostril, point the applicator out toward the left ear Continue saline nasal rinses as needed for nasal symptoms. Use this before any medicated nasal sprays for best result Use RyVent (carbinoxamine) 6-8 mg twice a day to control nasal symptoms.     Follow up in 6 months or sooner if needed.  I appreciate the opportunity to take part in Kanyah's care. Please do not hesitate to contact me with questions.  Sincerely,   Margo Aye, MD Allergy/Immunology [facility]

## 2023-09-18 ENCOUNTER — Ambulatory Visit
Admission: RE | Admit: 2023-09-18 | Discharge: 2023-09-18 | Disposition: A | Discharge status: Home / Self Care | Source: Ambulatory Visit | Attending: Family Medicine | Admitting: Family Medicine

## 2023-09-18 ENCOUNTER — Other Ambulatory Visit: Payer: Self-pay | Admitting: Family Medicine

## 2023-09-18 DIAGNOSIS — E119 Type 2 diabetes mellitus without complications: Secondary | ICD-10-CM | POA: Diagnosis not present

## 2023-09-18 DIAGNOSIS — H8390 Unspecified disease of inner ear, unspecified ear: Secondary | ICD-10-CM | POA: Diagnosis not present

## 2023-09-18 DIAGNOSIS — W19XXXA Unspecified fall, initial encounter: Secondary | ICD-10-CM | POA: Diagnosis not present

## 2023-09-18 DIAGNOSIS — Z6827 Body mass index (BMI) 27.0-27.9, adult: Secondary | ICD-10-CM | POA: Diagnosis not present

## 2023-09-18 DIAGNOSIS — E785 Hyperlipidemia, unspecified: Secondary | ICD-10-CM | POA: Diagnosis not present

## 2023-09-18 DIAGNOSIS — R220 Localized swelling, mass and lump, head: Secondary | ICD-10-CM | POA: Diagnosis not present

## 2023-09-18 DIAGNOSIS — R519 Headache, unspecified: Secondary | ICD-10-CM | POA: Diagnosis not present

## 2023-09-18 DIAGNOSIS — S0990XA Unspecified injury of head, initial encounter: Secondary | ICD-10-CM

## 2023-09-18 DIAGNOSIS — R54 Age-related physical debility: Secondary | ICD-10-CM | POA: Diagnosis not present

## 2023-09-18 DIAGNOSIS — R42 Dizziness and giddiness: Secondary | ICD-10-CM | POA: Diagnosis not present

## 2023-09-18 DIAGNOSIS — E7800 Pure hypercholesterolemia, unspecified: Secondary | ICD-10-CM | POA: Diagnosis not present

## 2023-09-18 DIAGNOSIS — M549 Dorsalgia, unspecified: Secondary | ICD-10-CM | POA: Diagnosis not present

## 2023-09-18 DIAGNOSIS — D729 Disorder of white blood cells, unspecified: Secondary | ICD-10-CM | POA: Diagnosis not present

## 2023-09-20 DIAGNOSIS — Z1231 Encounter for screening mammogram for malignant neoplasm of breast: Secondary | ICD-10-CM | POA: Diagnosis not present

## 2023-09-21 ENCOUNTER — Telehealth: Payer: Self-pay

## 2023-09-21 DIAGNOSIS — M1711 Unilateral primary osteoarthritis, right knee: Secondary | ICD-10-CM

## 2023-09-21 NOTE — Telephone Encounter (Signed)
-----   Message from Adonis Huguenin sent at 08/30/2023 10:17 AM EDT ----- Right knee supp injection

## 2023-09-21 NOTE — Telephone Encounter (Signed)
 Talked with patient to schedule for gel injection.  Check referrasl tab

## 2023-09-21 NOTE — Telephone Encounter (Signed)
 Patient would like to know if she could do therapy for her back.  CB# [phone].  Please advise.  Thank you.

## 2023-09-22 ENCOUNTER — Other Ambulatory Visit: Payer: Self-pay | Admitting: Family

## 2023-09-22 DIAGNOSIS — M545 Low back pain, unspecified: Secondary | ICD-10-CM

## 2023-09-22 NOTE — Telephone Encounter (Signed)
 PT referral placed to [facility].

## 2023-09-22 NOTE — Telephone Encounter (Signed)
 Pt informed

## 2023-09-25 DIAGNOSIS — M62830 Muscle spasm of back: Secondary | ICD-10-CM | POA: Diagnosis not present

## 2023-09-25 DIAGNOSIS — D729 Disorder of white blood cells, unspecified: Secondary | ICD-10-CM | POA: Diagnosis not present

## 2023-09-25 DIAGNOSIS — R54 Age-related physical debility: Secondary | ICD-10-CM | POA: Diagnosis not present

## 2023-09-25 DIAGNOSIS — W19XXXD Unspecified fall, subsequent encounter: Secondary | ICD-10-CM | POA: Diagnosis not present

## 2023-09-25 DIAGNOSIS — E782 Mixed hyperlipidemia: Secondary | ICD-10-CM | POA: Diagnosis not present

## 2023-09-25 DIAGNOSIS — I10 Essential (primary) hypertension: Secondary | ICD-10-CM | POA: Diagnosis not present

## 2023-10-03 DIAGNOSIS — Z23 Encounter for immunization: Secondary | ICD-10-CM | POA: Diagnosis not present

## 2023-10-05 ENCOUNTER — Ambulatory Visit (INDEPENDENT_AMBULATORY_CARE_PROVIDER_SITE_OTHER): Admitting: Orthopedic Surgery

## 2023-10-05 ENCOUNTER — Telehealth: Payer: Self-pay

## 2023-10-05 ENCOUNTER — Other Ambulatory Visit (HOSPITAL_COMMUNITY): Payer: Self-pay

## 2023-10-05 DIAGNOSIS — M1711 Unilateral primary osteoarthritis, right knee: Secondary | ICD-10-CM

## 2023-10-05 MED ORDER — ALBUTEROL SULFATE HFA 108 (90 BASE) MCG/ACT IN AERS: INHALATION_SPRAY | RESPIRATORY_TRACT | 2 refills | Status: DC

## 2023-10-05 NOTE — Telephone Encounter (Signed)
 Sent in new Generic Albuterol HFA to pharmacy per PA Team notation below.

## 2023-10-05 NOTE — Telephone Encounter (Signed)
*  Asthma/Allergy  Pharmacy Patient Advocate Encounter   Received notification from Fax that prior authorization for Ventolin HFA is required/requested.   Insurance verification completed.   The patient is insured through  PFT  .   Per test claim:  Albuterol 8.5 gm HFA is preferred by the insurance.  If suggested medication is appropriate, Please send in a new RX and discontinue this one. If not, please advise as to why it's not appropriate so that we may request a Prior Authorization. Please note, some preferred medications may still require a PA.  If the suggested medications have not been trialed and there are no contraindications to their use, the PA will not be submitted, as it will not be approved.

## 2023-10-09 DIAGNOSIS — H43813 Vitreous degeneration, bilateral: Secondary | ICD-10-CM | POA: Diagnosis not present

## 2023-10-10 ENCOUNTER — Encounter: Payer: Self-pay | Admitting: Orthopedic Surgery

## 2023-10-10 DIAGNOSIS — M1711 Unilateral primary osteoarthritis, right knee: Secondary | ICD-10-CM

## 2023-10-10 MED ORDER — HYALURONAN 88 MG/4ML IX SOSY
88.0000 mg | PREFILLED_SYRINGE | INTRA_ARTICULAR | Status: AC | PRN
  Administered 2023-10-10: 88 mg via INTRA_ARTICULAR

## 2023-10-10 MED ORDER — LIDOCAINE HCL (PF) 1 % IJ SOLN
2.0000 mL | INTRAMUSCULAR | Status: AC | PRN
  Administered 2023-10-10: 2 mL

## 2023-10-10 NOTE — Progress Notes (Signed)
 Office Visit Note   Patient: [name]           Date of Birth: [birth year]           MRN: [id number] Visit Date: 10/05/2023              Requested by: Jonathon Neighbors, MD [address] [place],  [place] [address] PCP: Jonathon Neighbors, MD  Chief Complaint  Patient presents with   Right Knee - Injections    monovisc      HPI: Patient is an 83 year old woman with osteoarthritis of her right knee.  Assessment & Plan: Visit Diagnoses:  1. Unilateral primary osteoarthritis, right knee     Plan: Right knee was injected with Monovisc she tolerated this well.  Follow-Up Instructions: Return if symptoms worsen or fail to improve.   Ortho Exam  Patient is alert, oriented, no adenopathy, well-dressed, normal affect, normal respiratory effort. Examination there is no redness or cellulitis of the right knee.  There is no effusion.  Collaterals and cruciates are stable.  Imaging: No results found. No images are attached to the encounter.  Labs: No results found for: "HGBA1C", "ESRSEDRATE", "CRP", "LABURIC", "REPTSTATUS", "GRAMSTAIN", "CULT", "LABORGA"   No results found for: "ALBUMIN", "PREALBUMIN", "CBC"  No results found for: "MG" No results found for: "VD25OH"  No results found for: "PREALBUMIN"    Latest Ref Rng & Units 11/09/2005    1:23 PM  CBC EXTENDED  WBC 3.9 - 10.0 10e3/uL 2.5   RBC 3.70 - 5.32 10e6/uL 4.61   Hemoglobin 11.6 - 15.9 g/dL 41.3   HCT 24.4 - 01.0 % 41.8   Platelets 145 - 400 10e3/uL 174   NEUT# 1.5 - 6.5 10e3/uL 0.8   Lymph# 0.9 - 3.3 10e3/uL 1.4      There is no height or weight on file to calculate BMI.  Orders:  No orders of the defined types were placed in this encounter.  No orders of the defined types were placed in this encounter.    Procedures: Large Joint Inj: R knee on 10/10/2023 10:09 AM Indications: pain and diagnostic evaluation Details: 22 G 1.5 in needle, anteromedial approach  Arthrogram: No  Medications: 2 mL  lidocaine  (PF) 1 %; 88 mg Hyaluronan 88 MG/4ML Outcome: tolerated well, no immediate complications Procedure, treatment alternatives, risks and benefits explained, specific risks discussed. Consent was given by the patient. Immediately prior to procedure a time out was called to verify the correct patient, procedure, equipment, support staff and site/side marked as required. Patient was prepped and draped in the usual sterile fashion.      Clinical Data: No additional findings.  ROS:  All other systems negative, except as noted in the HPI. Review of Systems  Objective: Vital Signs: There were no vitals taken for this visit.  Specialty Comments:  No specialty comments available.  PMFS History: Patient Active Problem List   Diagnosis Date Noted   Arthritis of right hip 03/21/2022   Reactive airway disease 07/29/2020   Seasonal and perennial allergic rhinitis 07/29/2020   Gastroesophageal reflux disease 07/29/2020   Unilateral primary osteoarthritis, right knee 11/21/2017   Status post revision of total knee, left 11/21/2017   Chronic rhinitis 02/25/2016   Cough 02/25/2016   Hoarseness of voice 02/25/2016   Chronic leukopenia 12/15/2015   Past Medical History:  Diagnosis Date   Asthma    allergic induced   Cough    Hypertension    Rhinitis  Family History  Problem Relation Age of Onset   Other Mother        died when patient was 95 months old   Hypertension Father    Cerebral aneurysm Sister    Breast cancer Sister    Lung cancer Sister    Healthy Son    Allergic rhinitis Neg Hx    Angioedema Neg Hx    Asthma Neg Hx    Eczema Neg Hx    Immunodeficiency Neg Hx    Urticaria Neg Hx     Past Surgical History:  Procedure Laterality Date   ABDOMINAL HYSTERECTOMY     BACK SURGERY     CATARACT EXTRACTION     MASS EXCISION  01/05/2012   Procedure: MINOR EXCISION OF MASS;  Surgeon: Amelie Baize., MD;  Location: [facility];  Service:  Orthopedics;  Laterality: Right;  excision mucoid cyst right long finger, debride DIP joint   ROTATOR CUFF REPAIR Bilateral    TOTAL KNEE ARTHROPLASTY Left    twice   Social History   Occupational History   Occupation: retired    Comment: Occupational hygienist  Tobacco Use   Smoking status: Never    Passive exposure: Never   Smokeless tobacco: Never  Vaping Use   Vaping status: Never Used  Substance and Sexual Activity   Alcohol  use: No    Alcohol /week: 0.0 standard drinks of alcohol    Drug use: No   Sexual activity: Not on file    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

## 2023-10-12 DIAGNOSIS — D704 Cyclic neutropenia: Secondary | ICD-10-CM | POA: Diagnosis not present

## 2023-10-16 ENCOUNTER — Telehealth: Payer: Self-pay | Admitting: Allergy

## 2023-10-16 DIAGNOSIS — H6123 Impacted cerumen, bilateral: Secondary | ICD-10-CM | POA: Diagnosis not present

## 2023-10-16 NOTE — Telephone Encounter (Signed)
 Dana Duncan from [employer] called and stated she is reaching out on behalf of patient [name] to see if she can speak with one of our nurses about making her medication she is prescribed from us  more affordable for her. She is requesting a call back. [phone] is her contact and the reference number is [id number]

## 2023-10-17 NOTE — Telephone Encounter (Signed)
 Called and spoke to [place]lo at [employer] and he expressed that [name] was not in at the moment but she would have her call our office back and speak to any nurse to assist with this matter. Matt expressed that in the note [name] wanted to discuss changing patients inhaler to one that is a tier one drug. Reference number is [id number]

## 2023-10-19 ENCOUNTER — Other Ambulatory Visit (HOSPITAL_COMMUNITY): Payer: Self-pay

## 2023-10-19 ENCOUNTER — Telehealth: Payer: Self-pay

## 2023-10-19 NOTE — Telephone Encounter (Signed)
 I can try to do a Tier Exception, however the patient will still need to meet their deductible before these lower prices take effect.

## 2023-10-19 NOTE — Telephone Encounter (Signed)
 Okay

## 2023-10-19 NOTE — Telephone Encounter (Signed)
 Generic Symbicort  is a lower tier and is $40.00

## 2023-10-19 NOTE — Telephone Encounter (Signed)
 Is this regarding the inhaler??

## 2023-10-19 NOTE — Telephone Encounter (Signed)
 Called [employer] - spoke to [place] - DOB verified - stated patient can't use Generic Albuterol  - requesting Brand Ventolin  due to the generic ineffectiveness.   Forwarding updated message to PA Team to initiate PA for Brand Ventolin .

## 2023-10-19 NOTE — Telephone Encounter (Signed)
 Per provider:  Ok.  Her current inhaler is symbicort .    Hopefully this company will provide what the tier 1 drugs are.   Called [employer] - spoke to JAARS - DOB verified - stated patient can't use Generic Albuterol  - requesting Brand Ventolin  due to the generic ineffectiveness.  Dana Duncan was advised I would forward updated message to our PA team.  Dana Duncan verbalized understanding to all, no further questions.   Please refer to the PA Teams's 10/05/23 for all updates regarding inhaler.

## 2023-10-19 NOTE — Telephone Encounter (Signed)
 Other preferred alternative is the ProAir  Respiclick- copay $40.00

## 2023-10-19 NOTE — Telephone Encounter (Signed)
 Tier Exception submitted under CMM Key: [id number]

## 2023-10-19 NOTE — Telephone Encounter (Addendum)
 Forwarding message back to PA Team to see if Tier exception is possible to lower the cost for patient.  The medication is on Tier 4 - Katherine/[employer] is trying to see if Tier exceptions can be done - so patient's co-pay can be lowered/more affordable.   Received call from [employer]/Shehana - [phone] - DOB verified - stated to have Tier exception processed to lower patient's co-pay - we would need to contact [employer]'s PA department @ [phone]) [phone]/Fax#: [phone].  Shehana stated/explained that the ProAir  Respiclick  is a Tier 3 but with an approved Tier exception, the Tier would be lowered to Tier 2 - 90 day supply would then be $15 as a co-pay.   Forwarding updated message to PA Team.

## 2023-10-20 ENCOUNTER — Ambulatory Visit (INDEPENDENT_AMBULATORY_CARE_PROVIDER_SITE_OTHER): Admitting: Physical Therapy

## 2023-10-20 ENCOUNTER — Encounter: Payer: Self-pay | Admitting: Physical Therapy

## 2023-10-20 ENCOUNTER — Other Ambulatory Visit: Payer: Self-pay

## 2023-10-20 DIAGNOSIS — M5459 Other low back pain: Secondary | ICD-10-CM

## 2023-10-20 DIAGNOSIS — M6281 Muscle weakness (generalized): Secondary | ICD-10-CM

## 2023-10-20 DIAGNOSIS — M546 Pain in thoracic spine: Secondary | ICD-10-CM | POA: Diagnosis not present

## 2023-10-20 DIAGNOSIS — R293 Abnormal posture: Secondary | ICD-10-CM

## 2023-10-20 NOTE — Telephone Encounter (Signed)
 Yes, Symbicort  is an inhaler.

## 2023-10-20 NOTE — Telephone Encounter (Signed)
 Pharmacy Patient Advocate Encounter  Received notification from [employer] that Prior Authorization for ProAir  Respiclick has been DENIED.  Full denial letter will be uploaded to the media tab. See denial reason below.     We denied this request under Medicare Part D because your cost share exception request for PROAIR  RESPI AER is included on our formulary on the preferred brand tier. Per your Evidence of Coverage, drugs on this tier are not eligible for tier exception. The drug may be covered if you were charged a higher cost-share for PROAIR  RESPI AER than you should have, according to your benefit. **Please note: The requested drug will continue to be covered at the tier 3 copay.

## 2023-10-20 NOTE — Telephone Encounter (Signed)
 Per PA Team:  The ProAir  Respiclick is not eligible for a Tier Exception. Medication is a tier 3 and will continue to be the tier 3 cost, again the copay for ProAir  Respiclick is $40.00    Forwarding updated message to provider for next step.

## 2023-10-20 NOTE — Telephone Encounter (Signed)
 Per Provider - was ant Tier 2 or Tier 1 alternatives provided?

## 2023-10-20 NOTE — Therapy (Signed)
 OUTPATIENT PHYSICAL THERAPY THORACOLUMBAR EVALUATION   Patient Name: [name] MRN: [id number] DOB:Nov 02, 1940, 83 y.o., female Today's Date: 10/20/2023  END OF SESSION:  PT End of Session - 10/20/23 1306     Visit Number 1    Number of Visits 17    Date for PT Re-Evaluation 12/15/23    Authorization Type MCR    Authorization Time Period 10/20/23 to 12/15/23    Progress Note Due on Visit 10    PT Start Time 1147    PT Stop Time 1227    PT Time Calculation (min) 40 min    Activity Tolerance Patient tolerated treatment well    Behavior During Therapy WFL for tasks assessed/performed             Past Medical History:  Diagnosis Date   Asthma    allergic induced   Cough    Hypertension    Rhinitis    Past Surgical History:  Procedure Laterality Date   ABDOMINAL HYSTERECTOMY     BACK SURGERY     CATARACT EXTRACTION     MASS EXCISION  01/05/2012   Procedure: MINOR EXCISION OF MASS;  Surgeon: Amelie Baize., MD;  Location: [facility];  Service: Orthopedics;  Laterality: Right;  excision mucoid cyst right long finger, debride DIP joint   ROTATOR CUFF REPAIR Bilateral    TOTAL KNEE ARTHROPLASTY Left    twice   Patient Active Problem List   Diagnosis Date Noted   Arthritis of right hip 03/21/2022   Reactive airway disease 07/29/2020   Seasonal and perennial allergic rhinitis 07/29/2020   Gastroesophageal reflux disease 07/29/2020   Unilateral primary osteoarthritis, right knee 11/21/2017   Status post revision of total knee, left 11/21/2017   Chronic rhinitis 02/25/2016   Cough 02/25/2016   Hoarseness of voice 02/25/2016   Chronic leukopenia 12/15/2015    PCP: Jonathon Neighbors MD   REFERRING PROVIDER: Reuben Castilla, NP  REFERRING DIAG: M54.50 (ICD-10-CM) - Low back pain, unspecified back pain laterality, unspecified chronicity, unspecified whether sciatica present  Rationale for Evaluation and Treatment: Rehabilitation  THERAPY DIAG:   Abnormal posture  Muscle weakness (generalized)  Other low back pain  Pain in thoracic spine  ONSET DATE: late February/early march 2025  SUBJECTIVE:                                                                                                                                                                                           SUBJECTIVE STATEMENT:  I was taking the trash can out to the curb, the wheels got stuck and stopped and I twisted  and did something to my back. I thought it was under control but then ended up going to the the ortho. It has not been getting better. Its sort of under the shoulder blade down to the waist. No numbness or tingling. I can't stand for a long time or work in my garden due to pain, if I twist it feels like  a cramp. Worse on the right than the left, but now left is starting to catch up.   PERTINENT HISTORY:  See above  PAIN:  Are you having pain? Yes: NPRS scale: 6/10 Pain location: back- from shoulder blades down R>L Pain description: cramping  Aggravating factors: standing, twisting, bending Relieving factors: heat   PRECAUTIONS: None  RED FLAGS: None   WEIGHT BEARING RESTRICTIONS: No  FALLS:  Has patient fallen in last 6 months? No; some FOF   LIVING ENVIRONMENT: Lives with: lives alone Lives in: House/apartment Stairs: 2 STE no rails; flight inside but can stay on main level  Has following equipment at home: Grab bars  OCCUPATION: retiredAssociate Professor in [employer]  PLOF: Independent, Independent with basic ADLs, Independent with gait, and Independent with transfers  PATIENT GOALS: get back to prior level of function   NEXT MD VISIT: Referring PRN   OBJECTIVE:  Note: Objective measures were completed at Evaluation unless otherwise noted.  DIAGNOSTIC FINDINGS:   Narrative & Impression  CLINICAL DATA:  HEAD INJURY, fall onto face 3 days ago, upper right anterior swelling with dizziness and  headache.   EXAM: SKULL - COMPLETE 4 + VIEW   COMPARISON:  None Available.   FINDINGS: There is no evidence of skull fracture or other focal bone lesions. No mandibular fracture visualized. No air-fluid levels within the paranasal sinuses. Moderate degenerative changes of the atlantoaxial joint.   IMPRESSION:   Radiographs of the thoracic spine show moderate to severe degenerative  changes with loss of disc height as well as endplate spurring.  No acute  finding.  PATIENT SURVEYS:   Patient-Specific Activity Scoring Scheme  "0" represents "unable to perform." "10" represents "able to perform at prior level. 0 1 2 3 4 5 6 7 8 9  10 (Date and Score)   Activity Eval     1. Standing for a long time  3     2. Bending  3     3. Turning  2   4.Cooking  4   5. Walking  4   Score 3.2    Total score = sum of the activity scores/number of activities Minimum detectable change (90%CI) for average score = 2 points Minimum detectable change (90%CI) for single activity score = 3 points     COGNITION: Overall cognitive status: Within functional limits for tasks assessed     SENSATION: Not tested  MUSCLE LENGTH:  HS mod limitation B    POSTURE: rounded shoulders, forward head, decreased lumbar lordosis, and increased thoracic kyphosis  PALPATION: R lumbar paraspinals tight and tender   LUMBAR ROM:   AROM eval  Flexion Finger tips to toes but had to walk back up legs with UEs   Extension Mod limitation  Right lateral flexion Mod limitation- sharp pain   Left lateral flexion Mild limitation, stretch only   Right rotation Severe limitation   Left rotation Severe limitation   (Blank rows = not tested)  LOWER EXTREMITY ROM:     Active  Right eval Left eval  Hip flexion    Hip extension  Hip abduction    Hip adduction    Hip internal rotation    Hip external rotation    Knee flexion  35* chronic due to issues with past 2 TKRs   Knee extension    Ankle  dorsiflexion    Ankle plantarflexion    Ankle inversion    Ankle eversion     (Blank rows = not tested)  LOWER EXTREMITY MMT:    MMT Right eval Left eval  Hip flexion 3 3-  Hip extension    Hip abduction 3 3-  Hip adduction    Hip internal rotation    Hip external rotation    Knee flexion 4+ 4-  Knee extension 4+ 4  Ankle dorsiflexion 4+ 4  Ankle plantarflexion    Ankle inversion    Ankle eversion     (Blank rows = not tested)    TREATMENT DATE:   10/20/23  Exam, POC, HEP  Education on exam findings, exercise form, plan moving forward, role of core strength/joint mobility/hip strength in addressing her sx and goals  HEP practice of below                                                                                                                                    PATIENT EDUCATION:  Education details: see above  Person educated: Patient Education method: Explanation, Demonstration, and Handouts Education comprehension: verbalized understanding, returned demonstration, and needs further education  HOME EXERCISE PROGRAM: Access Code: [email or web address] URL: [email or web address] Date: 10/20/2023 Prepared by: Terrel Ferries  Exercises - Standing Lumbar Spine Flexion Stretch Counter  - 2 x daily - 7 x weekly - 1 sets - 3 reps - 30 seconds  hold - Seated Hamstring Stretch  - 2 x daily - 7 x weekly - 1 sets - 3 reps - 30 seconds  hold - Seated Posterior Pelvic Tilt  - 2 x daily - 7 x weekly - 1 sets - 10 reps - 3 seconds  hold - Supine Transversus Abdominis Bracing - Hands on Stomach  - 2 x daily - 7 x weekly - 1 sets - 10 reps - 3 seconds  hold  ASSESSMENT:  CLINICAL IMPRESSION: Patient is a 83 y.o. F who was seen today for physical therapy evaluation and treatment for M54.50 (ICD-10-CM) - Low back pain, unspecified back pain laterality, unspecified chronicity, unspecified whether sciatica present. Objectives as above, it seems to me that she likely  sustained a lumbar sprain in her episode trying to move the trash can and this injury has been drawn out from generalized weakness and deconditioning as well as poor overall biomechanics. Chronic limited L knee ROM following multiple TKRs is also impacting overall mechanics and likely contributing to symptoms. Will likely benefit from skilled PT services to address her concerns and work towards functional goals.   OBJECTIVE IMPAIRMENTS: Abnormal gait, decreased activity tolerance, decreased mobility, difficulty walking, decreased ROM, decreased strength, hypomobility,  increased fascial restrictions, increased muscle spasms, impaired flexibility, improper body mechanics, postural dysfunction, and pain.   ACTIVITY LIMITATIONS: carrying, lifting, bending, sitting, standing, squatting, sleeping, toileting, dressing, hygiene/grooming, and locomotion level  PARTICIPATION LIMITATIONS: meal prep, cleaning, laundry, shopping, community activity, and yard work  PERSONAL FACTORS: Age, Fitness, Past/current experiences, Social background, and Time since onset of injury/illness/exacerbation are also affecting patient's functional outcome.   REHAB POTENTIAL: Fair deconditioning, altered biomechanics from severely limited range in past TKR   CLINICAL DECISION MAKING: Evolving/moderate complexity  EVALUATION COMPLEXITY: Moderate   GOALS: Goals reviewed with patient? No  SHORT TERM GOALS: Target date: 11/17/2023    Will be compliant with appropriate progressive HEP GOAL STATUS: Initial   2. Will demonstrate improved postural awareness with all functional tasks, use of ergonomic aides PRN/as desired GOAL STATUS: Initial   3. Will demonstrate good functional biomechanics for bed mobility and floor to waist lifting mechanics  GOAL STATUS: Initial   4. Lumbar ROM to be no more than 25% all planes of motion  GOAL STATUS: Initial   LONG TERM GOALS: Target date: 12/15/2023    MMT to have improved by at  least 1 grade all weak groups  Baseline:  Goal status: INITIAL  2.  Will be to stand and cook for at least 30 minutes without increase in pain  Baseline:  Goal status: INITIAL  3.  Will be able to walk at least 3 blocks for exercise without increase in back pain  Baseline:  Goal status: INITIAL  4.  Pain to be no more than 4/10 at worst  Baseline:  Goal status: INITIAL  5.  PSFS to have improved by at least 3 points to show improved subjective function  Baseline:  Goal status: INITIAL    PLAN:  PT FREQUENCY: 2x/week  PT DURATION: 8 weeks  PLANNED INTERVENTIONS: 97750- Physical Performance Testing, 97110-Therapeutic exercises, 97530- Therapeutic activity, [id number]- Neuromuscular re-education, 97535- Self Care, 16109- Manual therapy, [id number]- Aquatic Therapy, U0454- Electrical stimulation (unattended), 97035- Ultrasound, Taping, and Dry Needling.  PLAN FOR NEXT SESSION: work on lumbar/hip mobility and core strength, proximal strength, biomechanics, postural training   Terrel Ferries, PT, DPT 10/20/23 1:07 PM

## 2023-10-23 ENCOUNTER — Encounter: Payer: Self-pay | Admitting: Physical Therapy

## 2023-10-23 ENCOUNTER — Ambulatory Visit (INDEPENDENT_AMBULATORY_CARE_PROVIDER_SITE_OTHER): Payer: Self-pay | Admitting: Physical Therapy

## 2023-10-23 DIAGNOSIS — M6281 Muscle weakness (generalized): Secondary | ICD-10-CM | POA: Diagnosis not present

## 2023-10-23 DIAGNOSIS — R293 Abnormal posture: Secondary | ICD-10-CM

## 2023-10-23 DIAGNOSIS — M546 Pain in thoracic spine: Secondary | ICD-10-CM

## 2023-10-23 DIAGNOSIS — M5459 Other low back pain: Secondary | ICD-10-CM

## 2023-10-23 NOTE — Telephone Encounter (Signed)
 The 8.5gm option has 200 doses, this is the same as the 18gm. The 18gm inhaler is larger because it contains a different formulation with more propellant and inactive ingredients.

## 2023-10-23 NOTE — Therapy (Signed)
 OUTPATIENT PHYSICAL THERAPY THORACOLUMBAR TREATMENT   Patient Name: [name] MRN: [id number] DOB:08/25/1940, 83 y.o., female Today's Date: 10/23/2023  END OF SESSION:  PT End of Session - 10/23/23 1514     Visit Number 2    Number of Visits 17    Date for PT Re-Evaluation 12/15/23    Authorization Type MCR    Authorization Time Period 10/20/23 to 12/15/23    Progress Note Due on Visit 10    PT Start Time 1510    PT Stop Time 1552    PT Time Calculation (min) 42 min    Activity Tolerance Patient tolerated treatment well    Behavior During Therapy WFL for tasks assessed/performed              Past Medical History:  Diagnosis Date   Asthma    allergic induced   Cough    Hypertension    Rhinitis    Past Surgical History:  Procedure Laterality Date   ABDOMINAL HYSTERECTOMY     BACK SURGERY     CATARACT EXTRACTION     MASS EXCISION  01/05/2012   Procedure: MINOR EXCISION OF MASS;  Surgeon: Amelie Baize., MD;  Location: [facility];  Service: Orthopedics;  Laterality: Right;  excision mucoid cyst right long finger, debride DIP joint   ROTATOR CUFF REPAIR Bilateral    TOTAL KNEE ARTHROPLASTY Left    twice   Patient Active Problem List   Diagnosis Date Noted   Arthritis of right hip 03/21/2022   Reactive airway disease 07/29/2020   Seasonal and perennial allergic rhinitis 07/29/2020   Gastroesophageal reflux disease 07/29/2020   Unilateral primary osteoarthritis, right knee 11/21/2017   Status post revision of total knee, left 11/21/2017   Chronic rhinitis 02/25/2016   Cough 02/25/2016   Hoarseness of voice 02/25/2016   Chronic leukopenia 12/15/2015    PCP: Jonathon Neighbors MD   REFERRING PROVIDER: Reuben Castilla, NP  REFERRING DIAG: M54.50 (ICD-10-CM) - Low back pain, unspecified back pain laterality, unspecified chronicity, unspecified whether sciatica present  Rationale for Evaluation and Treatment: Rehabilitation  THERAPY DIAG:   Abnormal posture  Muscle weakness (generalized)  Other low back pain  Pain in thoracic spine  ONSET DATE: late February/early march 2025  SUBJECTIVE:                                                                                                                                                                                           SUBJECTIVE STATEMENT: Feels a little better; doing exercises consistently  PERTINENT HISTORY:  See above  PAIN:  Are you having  pain? Yes: NPRS scale: 6/10 Pain location: back- from shoulder blades down R>L Pain description: cramping  Aggravating factors: standing, twisting, bending Relieving factors: heat   PRECAUTIONS: None  RED FLAGS: None   WEIGHT BEARING RESTRICTIONS: No  FALLS:  Has patient fallen in last 6 months? 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  DIAGNOSTIC FINDINGS:   Narrative & Impression  CLINICAL DATA:  HEAD INJURY, fall onto face 3 days ago, upper right anterior swelling with dizziness and headache.   EXAM: SKULL - COMPLETE 4 + VIEW   COMPARISON:  None Available.   FINDINGS: There is no evidence of skull fracture or other focal bone lesions. No mandibular fracture visualized. No air-fluid levels within the paranasal sinuses. Moderate degenerative changes of the atlantoaxial joint.   IMPRESSION:   Radiographs of the thoracic spine show moderate to severe degenerative  changes with loss of disc height  as well as endplate spurring.  No acute  finding.  PATIENT SURVEYS:   Patient-Specific Activity Scoring Scheme  "0" represents "unable to perform." "10" represents "able to perform at prior level. 0 1 2 3 4 5 6 7 8 9  10 (Date and Score)   Activity Eval     1. Standing for a long time  3     2. Bending  3     3. Turning  2   4.Cooking  4   5. Walking  4   Score 3.2    Total score = sum of the activity scores/number of activities Minimum detectable change (90%CI) for average score = 2 points Minimum detectable change (90%CI) for single activity score = 3 points     COGNITION: Overall cognitive status: Within functional limits for tasks assessed     SENSATION: Not tested  MUSCLE LENGTH:  HS mod limitation B    POSTURE: rounded shoulders, forward head, decreased lumbar lordosis, and increased thoracic kyphosis  PALPATION: R lumbar paraspinals tight and tender   LUMBAR ROM:   AROM eval  Flexion Finger tips to toes but had to walk back up legs with UEs   Extension Mod limitation  Right lateral flexion Mod limitation- sharp pain   Left lateral flexion Mild limitation, stretch only   Right rotation Severe limitation   Left rotation Severe limitation   (Blank rows = not tested)  LOWER EXTREMITY ROM:     Active  Right eval Left eval  Hip flexion    Hip extension    Hip abduction    Hip adduction    Hip internal rotation    Hip external rotation    Knee flexion  35* chronic due to issues with past 2 TKRs   Knee extension    Ankle dorsiflexion    Ankle plantarflexion    Ankle inversion    Ankle eversion     (Blank rows = not tested)  LOWER EXTREMITY MMT:    MMT Right eval Left eval  Hip flexion 3 3-  Hip extension    Hip abduction 3 3-  Hip adduction    Hip internal rotation    Hip external rotation    Knee flexion 4+ 4-  Knee extension 4+ 4  Ankle dorsiflexion 4+ 4  Ankle plantarflexion    Ankle inversion    Ankle eversion     (Blank rows  = not tested)    TREATMENT DATE:  10/23/23 TherEx NuStep UEs/LES seat 9 within available knee range x 8 min; L5 Seated hamstring stretch 3x30 sec bil Seated pelvic tilts 10 x 3 sec hold Seated trunk rotation x 10 reps each direction; 5 sec hold Seated overhead reach with 2# med ball x 10 reps - cues for core engagement Standing lumbar stretch forward flexion 3x30 sec hold mid/Lt/Rt   10/20/23  Exam, POC, HEP  Education on exam findings, exercise form, plan moving forward, role of core strength/joint mobility/hip strength in addressing her sx and goals  HEP practice of below                                                                                                                                    PATIENT EDUCATION:  Education details: see above  Person educated: Patient Education method: Explanation, Demonstration, and Handouts Education comprehension: verbalized understanding, returned demonstration, and needs further education  HOME EXERCISE PROGRAM: Access Code: [email or web address] URL: [email or web address] Date: 10/20/2023 Prepared by: Terrel Ferries  Exercises - Standing Lumbar Spine Flexion Stretch Counter  - 2 x daily - 7 x weekly - 1 sets - 3 reps - 30 seconds  hold - Seated Hamstring Stretch  - 2 x daily - 7 x weekly - 1 sets - 3 reps - 30 seconds  hold - Seated Posterior Pelvic Tilt  - 2 x daily - 7 x weekly - 1 sets - 10 reps - 3 seconds  hold - Supine Transversus Abdominis Bracing - Hands on Stomach  - 2 x daily - 7 x weekly - 1 sets - 10 reps - 3 seconds  hold  ASSESSMENT:  CLINICAL IMPRESSION: Session today focused mainly on review of HEP as well as continued flexibility and core activation.  Overall reporting decreased pain, will continue to benefit from PT to maximize function.   OBJECTIVE IMPAIRMENTS: Abnormal gait, decreased activity tolerance, decreased mobility, difficulty walking, decreased ROM, decreased strength, hypomobility, increased  fascial restrictions, increased muscle spasms, impaired flexibility, improper body mechanics, postural dysfunction, and pain.   ACTIVITY LIMITATIONS: carrying, lifting, bending, sitting, standing, squatting, sleeping, toileting, dressing, hygiene/grooming, and locomotion level  PARTICIPATION LIMITATIONS: meal prep, cleaning, laundry, shopping, community activity, and yard work  PERSONAL FACTORS: Age, Fitness, Past/current experiences, Social background, and Time since onset of injury/illness/exacerbation are also affecting patient's functional outcome.   REHAB POTENTIAL: Fair deconditioning, altered biomechanics from severely limited range in past TKR   CLINICAL DECISION MAKING: Evolving/moderate complexity  EVALUATION COMPLEXITY: Moderate   GOALS: Goals reviewed with patient? No  SHORT TERM GOALS: Target date: 11/17/2023  Will be compliant with appropriate progressive HEP GOAL STATUS: Initial   2. Will demonstrate improved postural awareness with all functional tasks, use of ergonomic aides PRN/as desired GOAL STATUS: Initial   3. Will demonstrate good functional biomechanics for bed mobility and floor to waist lifting mechanics  GOAL STATUS: Initial   4. Lumbar ROM to be no more than 25% all planes of motion  GOAL STATUS: Initial   LONG TERM GOALS: Target date: 12/15/2023    MMT to have improved by at least 1 grade all weak groups  Baseline:  Goal status: INITIAL  2.  Will be to stand and cook for at least 30 minutes without increase in pain  Baseline:  Goal status: INITIAL  3.  Will be able to walk at least 3 blocks for exercise without increase in back pain  Baseline:  Goal status: INITIAL  4.  Pain to be no more than 4/10 at worst  Baseline:  Goal status: INITIAL  5.  PSFS to have improved by at least 3 points to show improved subjective function  Baseline:  Goal status: INITIAL    PLAN:  PT FREQUENCY: 2x/week  PT DURATION: 8 weeks  PLANNED  INTERVENTIONS: 97750- Physical Performance Testing, 97110-Therapeutic exercises, 97530- Therapeutic activity, [id number]- Neuromuscular re-education, 97535- Self Care, 16109- Manual therapy, [id number]- Aquatic Therapy, U0454- Electrical stimulation (unattended), 97035- Ultrasound, Taping, and Dry Needling.  PLAN FOR NEXT SESSION: continue to work on lumbar/hip mobility and core strength, proximal strength, biomechanics, postural training   Marley Simmers, PT, DPT 10/23/23 3:53 PM'

## 2023-10-23 NOTE — Telephone Encounter (Signed)
 No alternatives were provided as this is a covered medication. Patient has to contact plan with formulary questions.

## 2023-10-23 NOTE — Telephone Encounter (Signed)
 Forwarding updated message to provider.

## 2023-10-24 ENCOUNTER — Telehealth: Payer: Self-pay | Admitting: Neurology

## 2023-10-24 ENCOUNTER — Other Ambulatory Visit: Payer: Self-pay

## 2023-10-24 DIAGNOSIS — G250 Essential tremor: Secondary | ICD-10-CM

## 2023-10-24 MED ORDER — PRIMIDONE 50 MG PO TABS: ORAL_TABLET | ORAL | 0 refills | Status: DC

## 2023-10-24 NOTE — Telephone Encounter (Signed)
 Pt. Out of primidone  (MYSOLINE ) 50 MG tablet and would like it refilled

## 2023-10-24 NOTE — Telephone Encounter (Signed)
 Per Provider:  Im not sure what this message thread is in reference too. But I went and looked at the other message thread regarding her medications and it was stating that the generic albuterol  patient would like the Ventolin  brand.  I am fine with her having Ventolin  brand.  It looks like Symbicort  is her insurance preferred inhaler.

## 2023-10-26 DIAGNOSIS — L811 Chloasma: Secondary | ICD-10-CM | POA: Diagnosis not present

## 2023-10-26 DIAGNOSIS — L810 Postinflammatory hyperpigmentation: Secondary | ICD-10-CM | POA: Diagnosis not present

## 2023-10-27 DIAGNOSIS — M62830 Muscle spasm of back: Secondary | ICD-10-CM | POA: Diagnosis not present

## 2023-10-27 DIAGNOSIS — I10 Essential (primary) hypertension: Secondary | ICD-10-CM | POA: Diagnosis not present

## 2023-10-27 DIAGNOSIS — E782 Mixed hyperlipidemia: Secondary | ICD-10-CM | POA: Diagnosis not present

## 2023-10-27 DIAGNOSIS — D729 Disorder of white blood cells, unspecified: Secondary | ICD-10-CM | POA: Diagnosis not present

## 2023-11-02 ENCOUNTER — Ambulatory Visit: Admitting: Physical Therapy

## 2023-11-02 DIAGNOSIS — M5459 Other low back pain: Secondary | ICD-10-CM | POA: Diagnosis not present

## 2023-11-02 DIAGNOSIS — R293 Abnormal posture: Secondary | ICD-10-CM

## 2023-11-02 DIAGNOSIS — M546 Pain in thoracic spine: Secondary | ICD-10-CM

## 2023-11-02 DIAGNOSIS — M6281 Muscle weakness (generalized): Secondary | ICD-10-CM

## 2023-11-02 NOTE — Therapy (Signed)
 OUTPATIENT PHYSICAL THERAPY THORACOLUMBAR TREATMENT   Patient Name: [name] MRN: [id number] DOB:Jul 13, 1940, 83 y.o., female Today's Date: 11/02/2023  END OF SESSION:  PT End of Session - 11/02/23 1016     Visit Number 3    Number of Visits 17    Date for PT Re-Evaluation 12/15/23    Authorization Type MCR    Authorization Time Period 10/20/23 to 12/15/23    Progress Note Due on Visit 10    PT Start Time 1016    PT Stop Time 1055    PT Time Calculation (min) 39 min    Activity Tolerance Patient tolerated treatment well    Behavior During Therapy WFL for tasks assessed/performed              Past Medical History:  Diagnosis Date   Asthma    allergic induced   Cough    Hypertension    Rhinitis    Past Surgical History:  Procedure Laterality Date   ABDOMINAL HYSTERECTOMY     BACK SURGERY     CATARACT EXTRACTION     MASS EXCISION  01/05/2012   Procedure: MINOR EXCISION OF MASS;  Surgeon: Amelie Baize., MD;  Location: [facility];  Service: Orthopedics;  Laterality: Right;  excision mucoid cyst right long finger, debride DIP joint   ROTATOR CUFF REPAIR Bilateral    TOTAL KNEE ARTHROPLASTY Left    twice   Patient Active Problem List   Diagnosis Date Noted   Arthritis of right hip 03/21/2022   Reactive airway disease 07/29/2020   Seasonal and perennial allergic rhinitis 07/29/2020   Gastroesophageal reflux disease 07/29/2020   Unilateral primary osteoarthritis, right knee 11/21/2017   Status post revision of total knee, left 11/21/2017   Chronic rhinitis 02/25/2016   Cough 02/25/2016   Hoarseness of voice 02/25/2016   Chronic leukopenia 12/15/2015    PCP: Jonathon Neighbors MD   REFERRING PROVIDER: Reuben Castilla, NP  REFERRING DIAG: M54.50 (ICD-10-CM) - Low back pain, unspecified back pain laterality, unspecified chronicity, unspecified whether sciatica present  Rationale for Evaluation and Treatment: Rehabilitation  THERAPY DIAG:   Abnormal posture  Muscle weakness (generalized)  Other low back pain  Pain in thoracic spine  ONSET DATE: late February/early march 2025  SUBJECTIVE:                                                                                                                                                                                           SUBJECTIVE STATEMENT: Pt states she was worn out after last session. Pt reports she could hardly walk the next day. States  she was sore for 3 days.   PERTINENT HISTORY:  See above  PAIN:  Are you having pain? Yes: NPRS scale: 6/10 Pain location: back- from shoulder blades down R>L Pain description: cramping  Aggravating factors: standing, twisting, bending Relieving factors: heat   PRECAUTIONS: None  RED FLAGS: None   WEIGHT BEARING RESTRICTIONS: No  FALLS:  Has patient fallen in last 6 months? No; some FOF   LIVING ENVIRONMENT: Lives with: lives alone Lives in: House/apartment Stairs: 2 STE no rails; flight inside but can stay on main level  Has following equipment at home: Grab bars  OCCUPATION: retiredAssociate Professor in [employer]  PLOF: Independent, Independent with basic ADLs, Independent with gait, and Independent with transfers  PATIENT GOALS: get back to prior level of function   NEXT MD VISIT: Referring PRN   OBJECTIVE:  Note: Objective measures were completed at Evaluation unless otherwise noted.  DIAGNOSTIC FINDINGS:   Narrative & Impression  CLINICAL DATA:  HEAD INJURY, fall onto face 3 days ago, upper right anterior swelling with dizziness and headache.   EXAM: SKULL - COMPLETE 4 + VIEW   COMPARISON:  None Available.   FINDINGS: There is no evidence of skull fracture or other focal bone lesions. No mandibular fracture visualized. No air-fluid levels within the paranasal sinuses. Moderate degenerative changes of the atlantoaxial joint.   IMPRESSION:   Radiographs of the thoracic  spine show moderate to severe degenerative  changes with loss of disc height as well as endplate spurring.  No acute  finding.  PATIENT SURVEYS:   Patient-Specific Activity Scoring Scheme  "0" represents "unable to perform." "10" represents "able to perform at prior level. 0 1 2 3 4 5 6 7 8 9  10 (Date and Score)   Activity Eval     1. Standing for a long time  3     2. Bending  3     3. Turning  2   4.Cooking  4   5. Walking  4   Score 3.2    Total score = sum of the activity scores/number of activities Minimum detectable change (90%CI) for average score = 2 points Minimum detectable change (90%CI) for single activity score = 3 points     COGNITION: Overall cognitive status: Within functional limits for tasks assessed     SENSATION: Not tested  MUSCLE LENGTH:  HS mod limitation B    POSTURE: rounded shoulders, forward head, decreased lumbar lordosis, and increased thoracic kyphosis  PALPATION: R lumbar paraspinals tight and tender   LUMBAR ROM:   AROM eval  Flexion Finger tips to toes but had to walk back up legs with UEs   Extension Mod limitation  Right lateral flexion Mod limitation- sharp pain   Left lateral flexion Mild limitation, stretch only   Right rotation Severe limitation   Left rotation Severe limitation   (Blank rows = not tested)  LOWER EXTREMITY ROM:     Active  Right eval Left eval  Hip flexion    Hip extension    Hip abduction    Hip adduction    Hip internal rotation    Hip external rotation    Knee flexion  35* chronic due to issues with past 2 TKRs   Knee extension    Ankle dorsiflexion    Ankle plantarflexion    Ankle inversion    Ankle eversion     (Blank rows = not tested)  LOWER EXTREMITY MMT:    MMT  Right eval Left eval  Hip flexion 3 3-  Hip extension    Hip abduction 3 3-  Hip adduction    Hip internal rotation    Hip external rotation    Knee flexion 4+ 4-  Knee extension 4+ 4  Ankle dorsiflexion 4+ 4   Ankle plantarflexion    Ankle inversion    Ankle eversion     (Blank rows = not tested)    TREATMENT DATE:  11/02/23 Therex Nustep UEs/LEs L3-4 x 6 min Supine LTR x10 Sidelying open/close book  Neuromuscular re-ed Supine PPT x10 Supine PPT + shoulder flexion alternating x10 Supine scap squeeze x10 Supine shoulder ER x10  Manual therapy STM & TPR R periscapular muscles, QL, lats  Self Care Self massage and myofacial release with tennis ball   10/23/23 TherEx NuStep UEs/LES seat 9 within available knee range x 8 min; L5 Seated hamstring stretch 3x30 sec bil Seated pelvic tilts 10 x 3 sec hold Seated trunk rotation x 10 reps each direction; 5 sec hold Seated overhead reach with 2# med ball x 10 reps - cues for core engagement Standing lumbar stretch forward flexion 3x30 sec hold mid/Lt/Rt   10/20/23  Exam, POC, HEP  Education on exam findings, exercise form, plan moving forward, role of core strength/joint mobility/hip strength in addressing her sx and goals  HEP practice of below                                                                                                                                    PATIENT EDUCATION:  Education details: see above  Person educated: Patient Education method: Explanation, Demonstration, and Handouts Education comprehension: verbalized understanding, returned demonstration, and needs further education  HOME EXERCISE PROGRAM: Access Code: [email or web address] URL: [email or web address] Date: 10/20/2023 Prepared by: Terrel Ferries  Exercises - Standing Lumbar Spine Flexion Stretch Counter  - 2 x daily - 7 x weekly - 1 sets - 3 reps - 30 seconds  hold - Seated Hamstring Stretch  - 2 x daily - 7 x weekly - 1 sets - 3 reps - 30 seconds  hold - Seated Posterior Pelvic Tilt  - 2 x daily - 7 x weekly - 1 sets - 10 reps - 3 seconds  hold - Supine Transversus Abdominis Bracing - Hands on Stomach  - 2 x daily - 7 x weekly - 1  sets - 10 reps - 3 seconds  hold  ASSESSMENT:  CLINICAL IMPRESSION: Limited due to decreased L knee flexion. Continued to work on improving core strength. No current issues with her HEP. Decreased intensity on Nustep as pt thinks this may have been what caused her the most soreness from the previous session. Worked on scapular mobility today as pt notes that her pain is slightly below her scapula and wraps to the front of her ribs.    OBJECTIVE IMPAIRMENTS: Abnormal gait, decreased activity tolerance, decreased mobility, difficulty  walking, decreased ROM, decreased strength, hypomobility, increased fascial restrictions, increased muscle spasms, impaired flexibility, improper body mechanics, postural dysfunction, and pain.   ACTIVITY LIMITATIONS: carrying, lifting, bending, sitting, standing, squatting, sleeping, toileting, dressing, hygiene/grooming, and locomotion level  PARTICIPATION LIMITATIONS: meal prep, cleaning, laundry, shopping, community activity, and yard work  PERSONAL FACTORS: Age, Fitness, Past/current experiences, Social background, and Time since onset of injury/illness/exacerbation are also affecting patient's functional outcome.   REHAB POTENTIAL: Fair deconditioning, altered biomechanics from severely limited range in past TKR   CLINICAL DECISION MAKING: Evolving/moderate complexity  EVALUATION COMPLEXITY: Moderate   GOALS: Goals reviewed with patient? No  SHORT TERM GOALS: Target date: 11/17/2023    Will be compliant with appropriate progressive HEP GOAL STATUS: Initial   2. Will demonstrate improved postural awareness with all functional tasks, use of ergonomic aides PRN/as desired GOAL STATUS: Initial   3. Will demonstrate good functional biomechanics for bed mobility and floor to waist lifting mechanics  GOAL STATUS: Initial   4. Lumbar ROM to be no more than 25% all planes of motion  GOAL STATUS: Initial   LONG TERM GOALS: Target date: 12/15/2023     MMT to have improved by at least 1 grade all weak groups  Baseline:  Goal status: INITIAL  2.  Will be to stand and cook for at least 30 minutes without increase in pain  Baseline:  Goal status: INITIAL  3.  Will be able to walk at least 3 blocks for exercise without increase in back pain  Baseline:  Goal status: INITIAL  4.  Pain to be no more than 4/10 at worst  Baseline:  Goal status: INITIAL  5.  PSFS to have improved by at least 3 points to show improved subjective function  Baseline:  Goal status: INITIAL    PLAN:  PT FREQUENCY: 2x/week  PT DURATION: 8 weeks  PLANNED INTERVENTIONS: 97750- Physical Performance Testing, 97110-Therapeutic exercises, 97530- Therapeutic activity, [id number]- Neuromuscular re-education, 97535- Self Care, [id number]- Manual therapy, [id number]- Aquatic Therapy, [id number]- Electrical stimulation (unattended), 97035- Ultrasound, Taping, and Dry Needling.  PLAN FOR NEXT SESSION: continue to work on lumbar/hip mobility and core strength, proximal strength, biomechanics, postural training   Caroleann Casler April Ma L Bhumi Godbey, PT, DPT 11/02/23 10:17 AM'

## 2023-11-07 ENCOUNTER — Other Ambulatory Visit: Payer: Self-pay

## 2023-11-07 ENCOUNTER — Ambulatory Visit (INDEPENDENT_AMBULATORY_CARE_PROVIDER_SITE_OTHER): Admitting: Physical Therapy

## 2023-11-07 DIAGNOSIS — M546 Pain in thoracic spine: Secondary | ICD-10-CM | POA: Diagnosis not present

## 2023-11-07 DIAGNOSIS — M5459 Other low back pain: Secondary | ICD-10-CM

## 2023-11-07 DIAGNOSIS — R293 Abnormal posture: Secondary | ICD-10-CM

## 2023-11-07 DIAGNOSIS — M6281 Muscle weakness (generalized): Secondary | ICD-10-CM

## 2023-11-07 MED ORDER — VENTOLIN HFA 108 (90 BASE) MCG/ACT IN AERS: 2.0000 | INHALATION_SPRAY | RESPIRATORY_TRACT | 2 refills | Status: DC | PRN

## 2023-11-07 NOTE — Therapy (Signed)
 OUTPATIENT PHYSICAL THERAPY THORACOLUMBAR TREATMENT   Patient Name: [name] MRN: [id number] DOB:Mar 12, 1941, 83 y.o., female Today's Date: 11/07/2023  END OF SESSION:  PT End of Session - 11/07/23 1144     Visit Number 4    Number of Visits 17    Date for PT Re-Evaluation 12/15/23    Authorization Type MCR    Authorization Time Period 10/20/23 to 12/15/23    Progress Note Due on Visit 10    PT Start Time 1145    PT Stop Time 1225    PT Time Calculation (min) 40 min    Activity Tolerance Patient tolerated treatment well    Behavior During Therapy WFL for tasks assessed/performed             Past Medical History:  Diagnosis Date   Asthma    allergic induced   Cough    Hypertension    Rhinitis    Past Surgical History:  Procedure Laterality Date   ABDOMINAL HYSTERECTOMY     BACK SURGERY     CATARACT EXTRACTION     MASS EXCISION  01/05/2012   Procedure: MINOR EXCISION OF MASS;  Surgeon: Amelie Baize., MD;  Location: [facility];  Service: Orthopedics;  Laterality: Right;  excision mucoid cyst right long finger, debride DIP joint   ROTATOR CUFF REPAIR Bilateral    TOTAL KNEE ARTHROPLASTY Left    twice   Patient Active Problem List   Diagnosis Date Noted   Arthritis of right hip 03/21/2022   Reactive airway disease 07/29/2020   Seasonal and perennial allergic rhinitis 07/29/2020   Gastroesophageal reflux disease 07/29/2020   Unilateral primary osteoarthritis, right knee 11/21/2017   Status post revision of total knee, left 11/21/2017   Chronic rhinitis 02/25/2016   Cough 02/25/2016   Hoarseness of voice 02/25/2016   Chronic leukopenia 12/15/2015    PCP: Jonathon Neighbors MD   REFERRING PROVIDER: Reuben Castilla, NP  REFERRING DIAG: M54.50 (ICD-10-CM) - Low back pain, unspecified back pain laterality, unspecified chronicity, unspecified whether sciatica present  Rationale for Evaluation and Treatment: Rehabilitation  THERAPY DIAG:   Abnormal posture  Muscle weakness (generalized)  Other low back pain  Pain in thoracic spine  ONSET DATE: late February/early march 2025  SUBJECTIVE:                                                                                                                                                                                           SUBJECTIVE STATEMENT: Pt states she woke up on Sunday in a lot of pain. Had a hard time getting it to calm  down. Has been using the ball for self massage.   PERTINENT HISTORY:  See above L knee flexion up to 35 deg limited from prior surgery  PAIN:  Are you having pain? Yes: NPRS scale: 6/10 Pain location: back- from shoulder blades down R>L Pain description: cramping  Aggravating factors: standing, twisting, bending Relieving factors: heat   PRECAUTIONS: None  RED FLAGS: None   WEIGHT BEARING RESTRICTIONS: No  FALLS:  Has patient fallen in last 6 months? 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  DIAGNOSTIC FINDINGS:   Narrative & Impression  CLINICAL DATA:  HEAD INJURY, fall onto face 3 days ago, upper right anterior swelling with dizziness and headache.   EXAM: SKULL - COMPLETE 4 + VIEW   COMPARISON:  None Available.   FINDINGS: There is no evidence of skull fracture or other focal bone lesions. No mandibular fracture visualized. No air-fluid levels within the paranasal sinuses. Moderate degenerative changes of the  atlantoaxial joint.   IMPRESSION:   Radiographs of the thoracic spine show moderate to severe degenerative  changes with loss of disc height as well as endplate spurring.  No acute  finding.  PATIENT SURVEYS:   Patient-Specific Activity Scoring Scheme  "0" represents "unable to perform." "10" represents "able to perform at prior level. 0 1 2 3 4 5 6 7 8 9  10 (Date and Score)   Activity Eval     1. Standing for a long time  3     2. Bending  3     3. Turning  2   4.Cooking  4   5. Walking  4   Score 3.2    Total score = sum of the activity scores/number of activities Minimum detectable change (90%CI) for average score = 2 points Minimum detectable change (90%CI) for single activity score = 3 points     COGNITION: Overall cognitive status: Within functional limits for tasks assessed     SENSATION: Not tested  MUSCLE LENGTH:  HS mod limitation B    POSTURE: rounded shoulders, forward head, decreased lumbar lordosis, and increased thoracic kyphosis  PALPATION: R lumbar paraspinals tight and tender   LUMBAR ROM:   AROM eval  Flexion Finger tips to toes but had to walk back up legs with UEs   Extension Mod limitation  Right lateral flexion Mod limitation- sharp pain   Left lateral flexion Mild limitation, stretch only   Right rotation Severe limitation   Left rotation Severe limitation   (Blank rows = not tested)  LOWER EXTREMITY ROM:     Active  Right eval Left eval  Hip flexion    Hip extension    Hip abduction    Hip adduction    Hip internal rotation    Hip external rotation    Knee flexion  35* chronic due to issues with past 2 TKRs   Knee extension    Ankle dorsiflexion    Ankle plantarflexion    Ankle inversion    Ankle eversion     (  Blank rows = not tested)  LOWER EXTREMITY MMT:    MMT Right eval Left eval  Hip flexion 3 3-  Hip extension    Hip abduction 3 3-  Hip adduction    Hip internal rotation    Hip external rotation     Knee flexion 4+ 4-  Knee extension 4+ 4  Ankle dorsiflexion 4+ 4  Ankle plantarflexion    Ankle inversion    Ankle eversion     (Blank rows = not tested)    TREATMENT DATE:  11/07/23 Therex Standing lumbar ext x10 Standing "L" stretch 2x 30" with lateral flexion x30" Standing at counter thoracic rotation x10  Therapeutic Activity Seated thoracic ext against ball x10 Seated shoulder ER 2x10 Seated shoulder flexion AAROM with 1# WaTE BAR x5, no bar x5 Wall slide + glute setting in staggered stance 2x5 Standing hip hinge to wall 2x5   11/02/23 Therex Nustep UEs/LEs L3-4 x 6 min Supine LTR x10 Sidelying open/close book  Neuromuscular re-ed Supine PPT x10 Supine PPT + shoulder flexion alternating x10 Supine scap squeeze x10 Supine shoulder ER x10  Manual therapy STM & TPR R periscapular muscles, QL, lats  Self Care Self massage and myofacial release with tennis ball   10/23/23 TherEx NuStep UEs/LES seat 9 within available knee range x 8 min; L5 Seated hamstring stretch 3x30 sec bil Seated pelvic tilts 10 x 3 sec hold Seated trunk rotation x 10 reps each direction; 5 sec hold Seated overhead reach with 2# med ball x 10 reps - cues for core engagement Standing lumbar stretch forward flexion 3x30 sec hold mid/Lt/Rt   10/20/23  Exam, POC, HEP  Education on exam findings, exercise form, plan moving forward, role of core strength/joint mobility/hip strength in addressing her sx and goals  HEP practice of below                                                                                                                                    PATIENT EDUCATION:  Education details: see above  Person educated: Patient Education method: Explanation, Demonstration, and Handouts Education comprehension: verbalized understanding, returned demonstration, and needs further education  HOME EXERCISE PROGRAM: Access Code: [email or web address] URL:  [email or web address] Date: 11/07/2023 Prepared by: Katlen Seyer April Erman Hayward  Exercises - Standing Lumbar Spine Flexion Stretch Counter  - 2 x daily - 7 x weekly - 1 sets - 3 reps - 30 seconds  hold - Seated Hamstring Stretch  - 2 x daily - 7 x weekly - 1 sets - 3 reps - 30 seconds  hold - Seated Posterior Pelvic Tilt  - 2 x daily - 7 x weekly - 1 sets - 10 reps - 3 seconds  hold - Supine Transversus Abdominis Bracing - Hands on Stomach  - 2 x daily - 7 x weekly - 1 sets - 10 reps - 3 seconds  hold - Standing Quadratus Lumborum Mobilization  with Small Ball on Wall  - 1 x daily - 7 x weekly - 1 sets - 10 reps - Sidelying Thoracic Rotation with Open Book  - 1 x daily - 7 x weekly - 1 sets - 3 reps - 10 sec hold  ASSESSMENT:  CLINICAL IMPRESSION: Performed more exercises in seated and standing today for more gravity dependent positions. Working on improving trunk extension in seated and standing as well as improving thoracic mobility to decrease her R sided thoracolumbar muscle spasm. Worked on bending mechanics to reduce thoracolumbar strain.    OBJECTIVE IMPAIRMENTS: Abnormal gait, decreased activity tolerance, decreased mobility, difficulty walking, decreased ROM, decreased strength, hypomobility, increased fascial restrictions, increased muscle spasms, impaired flexibility, improper body mechanics, postural dysfunction, and pain.   ACTIVITY LIMITATIONS: carrying, lifting, bending, sitting, standing, squatting, sleeping, toileting, dressing, hygiene/grooming, and locomotion level  PARTICIPATION LIMITATIONS: meal prep, cleaning, laundry, shopping, community activity, and yard work  PERSONAL FACTORS: Age, Fitness, Past/current experiences, Social background, and Time since onset of injury/illness/exacerbation are also affecting patient's functional outcome.   REHAB POTENTIAL: Fair deconditioning, altered biomechanics from severely limited range in past TKR   CLINICAL DECISION  MAKING: Evolving/moderate complexity  EVALUATION COMPLEXITY: Moderate   GOALS: Goals reviewed with patient? No  SHORT TERM GOALS: Target date: 11/17/2023    Will be compliant with appropriate progressive HEP GOAL STATUS: Initial   2. Will demonstrate improved postural awareness with all functional tasks, use of ergonomic aides PRN/as desired GOAL STATUS: Initial   3. Will demonstrate good functional biomechanics for bed mobility and floor to waist lifting mechanics  GOAL STATUS: Initial   4. Lumbar ROM to be no more than 25% all planes of motion  GOAL STATUS: Initial   LONG TERM GOALS: Target date: 12/15/2023    MMT to have improved by at least 1 grade all weak groups  Baseline:  Goal status: INITIAL  2.  Will be to stand and cook for at least 30 minutes without increase in pain  Baseline:  Goal status: INITIAL  3.  Will be able to walk at least 3 blocks for exercise without increase in back pain  Baseline:  Goal status: INITIAL  4.  Pain to be no more than 4/10 at worst  Baseline:  Goal status: INITIAL  5.  PSFS to have improved by at least 3 points to show improved subjective function  Baseline:  Goal status: INITIAL    PLAN:  PT FREQUENCY: 2x/week  PT DURATION: 8 weeks  PLANNED INTERVENTIONS: 97750- Physical Performance Testing, 97110-Therapeutic exercises, 97530- Therapeutic activity, [id number]- Neuromuscular re-education, 97535- Self Care, [id number]- Manual therapy, [id number]- Aquatic Therapy, [id number]- Electrical stimulation (unattended), 97035- Ultrasound, Taping, and Dry Needling.  PLAN FOR NEXT SESSION: continue to work on lumbar/hip mobility and core strength, proximal strength, biomechanics, postural training   Charlei Ramsaran April Ma L Esra Frankowski, [place], DPT 11/07/23 11:45 AM'

## 2023-11-09 ENCOUNTER — Ambulatory Visit (INDEPENDENT_AMBULATORY_CARE_PROVIDER_SITE_OTHER): Admitting: Physical Therapy

## 2023-11-09 DIAGNOSIS — M6281 Muscle weakness (generalized): Secondary | ICD-10-CM | POA: Diagnosis not present

## 2023-11-09 DIAGNOSIS — M5459 Other low back pain: Secondary | ICD-10-CM | POA: Diagnosis not present

## 2023-11-09 DIAGNOSIS — R293 Abnormal posture: Secondary | ICD-10-CM | POA: Diagnosis not present

## 2023-11-09 DIAGNOSIS — M546 Pain in thoracic spine: Secondary | ICD-10-CM | POA: Diagnosis not present

## 2023-11-09 NOTE — Therapy (Signed)
 OUTPATIENT PHYSICAL THERAPY THORACOLUMBAR TREATMENT   Patient Name: [name] MRN: [id number] DOB:1941/06/07, 83 y.o., female Today's Date: 11/09/2023  END OF SESSION:  PT End of Session - 11/09/23 1019     Visit Number 5    Number of Visits 17    Date for PT Re-Evaluation 12/15/23    Authorization Type MCR    Authorization Time Period 10/20/23 to 12/15/23    Progress Note Due on Visit 10    PT Start Time 1016    PT Stop Time 1055    PT Time Calculation (min) 39 min    Activity Tolerance Patient tolerated treatment well    Behavior During Therapy WFL for tasks assessed/performed              Past Medical History:  Diagnosis Date   Asthma    allergic induced   Cough    Hypertension    Rhinitis    Past Surgical History:  Procedure Laterality Date   ABDOMINAL HYSTERECTOMY     BACK SURGERY     CATARACT EXTRACTION     MASS EXCISION  01/05/2012   Procedure: MINOR EXCISION OF MASS;  Surgeon: Amelie Baize., MD;  Location: [facility];  Service: Orthopedics;  Laterality: Right;  excision mucoid cyst right long finger, debride DIP joint   ROTATOR CUFF REPAIR Bilateral    TOTAL KNEE ARTHROPLASTY Left    twice   Patient Active Problem List   Diagnosis Date Noted   Arthritis of right hip 03/21/2022   Reactive airway disease 07/29/2020   Seasonal and perennial allergic rhinitis 07/29/2020   Gastroesophageal reflux disease 07/29/2020   Unilateral primary osteoarthritis, right knee 11/21/2017   Status post revision of total knee, left 11/21/2017   Chronic rhinitis 02/25/2016   Cough 02/25/2016   Hoarseness of voice 02/25/2016   Chronic leukopenia 12/15/2015    PCP: Jonathon Neighbors MD   REFERRING PROVIDER: Reuben Castilla, NP  REFERRING DIAG: M54.50 (ICD-10-CM) - Low back pain, unspecified back pain laterality, unspecified chronicity, unspecified whether sciatica present  Rationale for Evaluation and Treatment: Rehabilitation  THERAPY DIAG:   No diagnosis found.  ONSET DATE: late February/early march 2025  SUBJECTIVE:                                                                                                                                                                                           SUBJECTIVE STATEMENT: Pt states knees and hips are sore.    PERTINENT HISTORY:  See above L knee flexion up to 35 deg limited from prior surgery  PAIN:  Are you having  pain? Yes: NPRS scale: 6/10 Pain location: back- from shoulder blades down R>L Pain description: cramping  Aggravating factors: standing, twisting, bending Relieving factors: heat   PRECAUTIONS: None  RED FLAGS: None   WEIGHT BEARING RESTRICTIONS: No  FALLS:  Has patient fallen in last 6 months? No; some FOF   LIVING ENVIRONMENT: Lives with: lives alone Lives in: House/apartment Stairs: 2 STE no rails; flight inside but can stay on main level  Has following equipment at home: Grab bars  OCCUPATION: retiredAssociate Professor in [employer]  PLOF: Independent, Independent with basic ADLs, Independent with gait, and Independent with transfers  PATIENT GOALS: get back to prior level of function   NEXT MD VISIT: Referring PRN   OBJECTIVE:  Note: Objective measures were completed at Evaluation unless otherwise noted.  DIAGNOSTIC FINDINGS:   Narrative & Impression  CLINICAL DATA:  HEAD INJURY, fall onto face 3 days ago, upper right anterior swelling with dizziness and headache.   EXAM: SKULL - COMPLETE 4 + VIEW   COMPARISON:  None Available.   FINDINGS: There is no evidence of skull fracture or other focal bone lesions. No mandibular fracture visualized. No air-fluid levels within the paranasal sinuses. Moderate degenerative changes of the atlantoaxial joint.   IMPRESSION:   Radiographs of the thoracic spine show moderate to severe degenerative  changes with loss of disc height as well as endplate spurring.  No acute   finding.  PATIENT SURVEYS:   Patient-Specific Activity Scoring Scheme  "0" represents "unable to perform." "10" represents "able to perform at prior level. 0 1 2 3 4 5 6 7 8 9  10 (Date and Score)   Activity Eval     1. Standing for a long time  3     2. Bending  3     3. Turning  2   4.Cooking  4   5. Walking  4   Score 3.2    Total score = sum of the activity scores/number of activities Minimum detectable change (90%CI) for average score = 2 points Minimum detectable change (90%CI) for single activity score = 3 points     COGNITION: Overall cognitive status: Within functional limits for tasks assessed     SENSATION: Not tested  MUSCLE LENGTH:  HS mod limitation B    POSTURE: rounded shoulders, forward head, decreased lumbar lordosis, and increased thoracic kyphosis  PALPATION: R lumbar paraspinals tight and tender   LUMBAR ROM:   AROM eval  Flexion Finger tips to toes but had to walk back up legs with UEs   Extension Mod limitation  Right lateral flexion Mod limitation- sharp pain   Left lateral flexion Mild limitation, stretch only   Right rotation Severe limitation   Left rotation Severe limitation   (Blank rows = not tested)  LOWER EXTREMITY ROM:     Active  Right eval Left eval  Hip flexion    Hip extension    Hip abduction    Hip adduction    Hip internal rotation    Hip external rotation    Knee flexion  35* chronic due to issues with past 2 TKRs   Knee extension    Ankle dorsiflexion    Ankle plantarflexion    Ankle inversion    Ankle eversion     (Blank rows = not tested)  LOWER EXTREMITY MMT:    MMT Right eval Left eval  Hip flexion 3 3-  Hip extension    Hip abduction 3 3-  Hip adduction    Hip internal rotation    Hip external rotation    Knee flexion 4+ 4-  Knee extension 4+ 4  Ankle dorsiflexion 4+ 4  Ankle plantarflexion    Ankle inversion    Ankle eversion     (Blank rows = not tested)    TREATMENT DATE:   11/09/23 Therex Seated hamstring stretch x 30" Supine hip flexor stretch x 30" Supine hip flexor + quad stretch with strap x30" Supine glute set x10 Supine ITB stretch with strap x30" Supine hip abd x10 Standing lumbar ext x5  Therapeutic Activity Standing shoulder ext red TB x10 Bow and arrow red TB x10 Row red TB x10 Hip hinge 2x5   11/07/23 Therex Standing lumbar ext x10 Standing "L" stretch 2x 30" with lateral flexion x30" Standing at counter thoracic rotation x10  Therapeutic Activity Seated thoracic ext against ball x10 Seated shoulder ER 2x10 Seated shoulder flexion AAROM with 1# WaTE BAR x5, no bar x5 Wall slide + glute setting in staggered stance 2x5 Standing hip hinge to wall 2x5   11/02/23 Therex Nustep UEs/LEs L3-4 x 6 min Supine LTR x10 Sidelying open/close book  Neuromuscular re-ed Supine PPT x10 Supine PPT + shoulder flexion alternating x10 Supine scap squeeze x10 Supine shoulder ER x10  Manual therapy STM & TPR R periscapular muscles, QL, lats  Self Care Self massage and myofacial release with tennis ball   10/23/23 TherEx NuStep UEs/LES seat 9 within available knee range x 8 min; L5 Seated hamstring stretch 3x30 sec bil Seated pelvic tilts 10 x 3 sec hold Seated trunk rotation x 10 reps each direction; 5 sec hold Seated overhead reach with 2# med ball x 10 reps - cues for core engagement Standing lumbar stretch forward flexion 3x30 sec hold mid/Lt/Rt   10/20/23  Exam, POC, HEP  Education on exam findings, exercise form, plan moving forward, role of core strength/joint mobility/hip strength in addressing her sx and goals  HEP practice of below                                                                                                                                    PATIENT EDUCATION:  Education details: see above  Person educated: Patient Education method: Explanation, Demonstration, and Handouts Education comprehension:  verbalized understanding, returned demonstration, and needs further education  HOME EXERCISE PROGRAM: Access Code: [email or web address] URL: [email or web address] Date: 11/07/2023 Prepared by: Vicky Schleich April Erman [place]  Exercises - Standing Lumbar Spine Flexion Stretch Counter  - 2 x daily - 7 x weekly - 1 sets - 3 reps - 30 seconds  hold - Seated Hamstring Stretch  - 2 x daily - 7 x weekly - 1 sets - 3 reps - 30 seconds  hold - Seated Posterior Pelvic Tilt  - 2 x daily - 7 x weekly - 1 sets - 10 reps - 3 seconds  hold - Supine Transversus  Abdominis Bracing - Hands on Stomach  - 2 x daily - 7 x weekly - 1 sets - 10 reps - 3 seconds  hold - Standing Quadratus Lumborum Mobilization with Small Ball on Wall  - 1 x daily - 7 x weekly - 1 sets - 10 reps - Sidelying Thoracic Rotation with Open Book  - 1 x daily - 7 x weekly - 1 sets - 3 reps - 10 sec hold  ASSESSMENT:  CLINICAL IMPRESSION: Pt reports thoracolumbar spasm is feeling okay this morning. Feeling some soreness from prior PT session -- mostly performed active recovery but did work on midback extension to improve her overall trunk extension.    OBJECTIVE IMPAIRMENTS: Abnormal gait, decreased activity tolerance, decreased mobility, difficulty walking, decreased ROM, decreased strength, hypomobility, increased fascial restrictions, increased muscle spasms, impaired flexibility, improper body mechanics, postural dysfunction, and pain.   ACTIVITY LIMITATIONS: carrying, lifting, bending, sitting, standing, squatting, sleeping, toileting, dressing, hygiene/grooming, and locomotion level  PARTICIPATION LIMITATIONS: meal prep, cleaning, laundry, shopping, community activity, and yard work  PERSONAL FACTORS: Age, Fitness, Past/current experiences, Social background, and Time since onset of injury/illness/exacerbation are also affecting patient's functional outcome.   REHAB POTENTIAL: Fair deconditioning, altered biomechanics from severely  limited range in past TKR   CLINICAL DECISION MAKING: Evolving/moderate complexity  EVALUATION COMPLEXITY: Moderate   GOALS: Goals reviewed with patient? No  SHORT TERM GOALS: Target date: 11/17/2023    Will be compliant with appropriate progressive HEP GOAL STATUS: IN PROGRESS 11/09/23   2. Will demonstrate improved postural awareness with all functional tasks, use of ergonomic aides PRN/as desired GOAL STATUS: IN PROGRESS 11/09/23   3. Will demonstrate good functional biomechanics for bed mobility and floor to waist lifting mechanics  GOAL STATUS: IN PROGRESS 11/09/23  4. Lumbar ROM to be no more than 25% all planes of motion  GOAL STATUS: IN PROGRESS 11/09/23  LONG TERM GOALS: Target date: 12/15/2023    MMT to have improved by at least 1 grade all weak groups  Baseline:  Goal status: INITIAL  2.  Will be to stand and cook for at least 30 minutes without increase in pain  Baseline:  Goal status: INITIAL  3.  Will be able to walk at least 3 blocks for exercise without increase in back pain  Baseline:  Goal status: INITIAL  4.  Pain to be no more than 4/10 at worst  Baseline:  Goal status: INITIAL  5.  PSFS to have improved by at least 3 points to show improved subjective function  Baseline:  Goal status: INITIAL    PLAN:  PT FREQUENCY: 2x/week  PT DURATION: 8 weeks  PLANNED INTERVENTIONS: 97750- Physical Performance Testing, 97110-Therapeutic exercises, 97530- Therapeutic activity, [id number]- Neuromuscular re-education, 97535- Self Care, [id number]- Manual therapy, [id number]- Aquatic Therapy, [id number]- Electrical stimulation (unattended), 97035- Ultrasound, Taping, and Dry Needling.  PLAN FOR NEXT SESSION: continue to work on lumbar/hip mobility and core strength, proximal strength, biomechanics, postural training   Shay Bartoli April Ma L Chrisopher Pustejovsky, [place], DPT 11/09/23 10:19 AM'

## 2023-11-10 ENCOUNTER — Other Ambulatory Visit: Payer: Self-pay | Admitting: Allergy

## 2023-11-10 ENCOUNTER — Other Ambulatory Visit (HOSPITAL_COMMUNITY): Payer: Self-pay

## 2023-11-10 ENCOUNTER — Telehealth: Payer: Self-pay

## 2023-11-10 MED ORDER — ALBUTEROL SULFATE HFA 108 (90 BASE) MCG/ACT IN AERS: 2.0000 | INHALATION_SPRAY | RESPIRATORY_TRACT | 1 refills | Status: AC | PRN

## 2023-11-10 NOTE — Telephone Encounter (Signed)
 Pharmacy Patient Advocate Encounter   Received notification from Fax that prior authorization for Ventolin  HFA is required/requested.   Insurance verification completed.   The patient is insured through PFT .   Per test claim:  Albuterol  8.5 gm HFA is preferred by the insurance.  If suggested medication is appropriate, Please send in a new RX and discontinue this one. If not, please advise as to why it's not appropriate so that we may request a Prior Authorization. Please note, some preferred medications may still require a PA.  If the suggested medications have not been trialed and there are no contraindications to their use, the PA will not be submitted, as it will not be approved.

## 2023-11-17 ENCOUNTER — Ambulatory Visit: Admitting: Physical Therapy

## 2023-11-17 ENCOUNTER — Encounter: Payer: Self-pay | Admitting: Physical Therapy

## 2023-11-17 DIAGNOSIS — M5459 Other low back pain: Secondary | ICD-10-CM | POA: Diagnosis not present

## 2023-11-17 DIAGNOSIS — M546 Pain in thoracic spine: Secondary | ICD-10-CM | POA: Diagnosis not present

## 2023-11-17 DIAGNOSIS — M6281 Muscle weakness (generalized): Secondary | ICD-10-CM | POA: Diagnosis not present

## 2023-11-17 DIAGNOSIS — R293 Abnormal posture: Secondary | ICD-10-CM | POA: Diagnosis not present

## 2023-11-17 NOTE — Therapy (Signed)
 OUTPATIENT PHYSICAL THERAPY THORACOLUMBAR TREATMENT   Patient Name: [name] MRN: [id number] DOB:1941-01-20, 83 y.o., female Today's Date: 11/17/2023  END OF SESSION:  PT End of Session - 11/17/23 1038     Visit Number 6    Number of Visits 17    Date for PT Re-Evaluation 12/15/23    Authorization Type MCR    Authorization Time Period 10/20/23 to 12/15/23    Progress Note Due on Visit 10    PT Start Time 1019    PT Stop Time 1057    PT Time Calculation (min) 38 min    Activity Tolerance Patient tolerated treatment well    Behavior During Therapy WFL for tasks assessed/performed               Past Medical History:  Diagnosis Date   Asthma    allergic induced   Cough    Hypertension    Rhinitis    Past Surgical History:  Procedure Laterality Date   ABDOMINAL HYSTERECTOMY     BACK SURGERY     CATARACT EXTRACTION     MASS EXCISION  01/05/2012   Procedure: MINOR EXCISION OF MASS;  Surgeon: Amelie Baize., MD;  Location: [facility];  Service: Orthopedics;  Laterality: Right;  excision mucoid cyst right long finger, debride DIP joint   ROTATOR CUFF REPAIR Bilateral    TOTAL KNEE ARTHROPLASTY Left    twice   Patient Active Problem List   Diagnosis Date Noted   Arthritis of right hip 03/21/2022   Reactive airway disease 07/29/2020   Seasonal and perennial allergic rhinitis 07/29/2020   Gastroesophageal reflux disease 07/29/2020   Unilateral primary osteoarthritis, right knee 11/21/2017   Status post revision of total knee, left 11/21/2017   Chronic rhinitis 02/25/2016   Cough 02/25/2016   Hoarseness of voice 02/25/2016   Chronic leukopenia 12/15/2015    PCP: Jonathon Neighbors MD   REFERRING PROVIDER: Reuben Castilla, NP  REFERRING DIAG: M54.50 (ICD-10-CM) - Low back pain, unspecified back pain laterality, unspecified chronicity, unspecified whether sciatica present  Rationale for Evaluation and Treatment: Rehabilitation  THERAPY DIAG:   Abnormal posture  Muscle weakness (generalized)  Other low back pain  Pain in thoracic spine  ONSET DATE: late February/early march 2025  SUBJECTIVE:                                                                                                                                                                                           SUBJECTIVE STATEMENT:  Everything is OK, symptoms/pain come and go- there are good and bad days especially if I overdo.  I do think things are headed the right direction in general. Still very hard for me to stand and prep in the kitchen, bending to pick something up is hard too.      PERTINENT HISTORY:  See above L knee flexion up to 35 deg limited from prior surgery  PAIN:  Are you having pain? Yes: NPRS scale: 4/10 Pain location: low back this morning  Pain description: sometimes shooting/burning feeling Aggravating factors: standing for a long time, twisting, bending Relieving factors: heat, exercises   PRECAUTIONS: None  RED FLAGS: None   WEIGHT BEARING RESTRICTIONS: No  FALLS:  Has patient fallen in last 6 months? No; some FOF   LIVING ENVIRONMENT: Lives with: lives alone Lives in: House/apartment Stairs: 2 STE no rails; flight inside but can stay on main level  Has following equipment at home: Grab bars  OCCUPATION: retiredAssociate Professor in [employer]  PLOF: Independent, Independent with basic ADLs, Independent with gait, and Independent with transfers  PATIENT GOALS: get back to prior level of function   NEXT MD VISIT: Referring PRN   OBJECTIVE:  Note: Objective measures were completed at Evaluation unless otherwise noted.  DIAGNOSTIC FINDINGS:   Narrative & Impression  CLINICAL DATA:  HEAD INJURY, fall onto face 3 days ago, upper right anterior swelling with dizziness and headache.   EXAM: SKULL - COMPLETE 4 + VIEW   COMPARISON:  None Available.   FINDINGS: There is no evidence of skull  fracture or other focal bone lesions. No mandibular fracture visualized. No air-fluid levels within the paranasal sinuses. Moderate degenerative changes of the atlantoaxial joint.   IMPRESSION:   Radiographs of the thoracic spine show moderate to severe degenerative  changes with loss of disc height as well as endplate spurring.  No acute  finding.  PATIENT SURVEYS:   Patient-Specific Activity Scoring Scheme  "0" represents "unable to perform." "10" represents "able to perform at prior level. 0 1 2 3 4 5 6 7 8 9  10 (Date and Score)   Activity Eval  11/17/23   1. Standing for a long time  3  3   2. Bending  3  4   3. Turning  2 3  4.Cooking  4 5  5. Walking  4 3  Score 3.2 3.6   Total score = sum of the activity scores/number of activities Minimum detectable change (90%CI) for average score = 2 points Minimum detectable change (90%CI) for single activity score = 3 points     COGNITION: Overall cognitive status: Within functional limits for tasks assessed     SENSATION: Not tested  MUSCLE LENGTH:  HS mod limitation B    POSTURE: rounded shoulders, forward head, decreased lumbar lordosis, and increased thoracic kyphosis  PALPATION: R lumbar paraspinals tight and tender   LUMBAR ROM:   AROM eval  Flexion Finger tips to toes but had to walk back up legs with UEs   Extension Mod limitation  Right lateral flexion Mod limitation- sharp pain   Left lateral flexion Mild limitation, stretch only   Right rotation Severe limitation   Left rotation Severe limitation   (Blank rows = not tested)  LOWER EXTREMITY ROM:     Active  Right eval Left eval  Hip flexion    Hip extension    Hip abduction    Hip adduction    Hip internal rotation    Hip external rotation    Knee flexion  35* chronic due to issues with  past 2 TKRs   Knee extension    Ankle dorsiflexion    Ankle plantarflexion    Ankle inversion    Ankle eversion     (Blank rows = not  tested)  LOWER EXTREMITY MMT:    MMT Right eval Left eval Right 11/17/23 Left 11/17/23  Hip flexion 3 3- 4- 4-  Hip extension      Hip abduction 3 3- 3 3-  Hip adduction      Hip internal rotation      Hip external rotation      Knee flexion 4+ 4- 4 Dnt   Knee extension 4+ 4 4 Dnt   Ankle dorsiflexion 4+ 4 4+ 4+  Ankle plantarflexion      Ankle inversion      Ankle eversion       (Blank rows = not tested)    TREATMENT DATE:   11/17/23  PSFS, MMT, goal check/general discussion of progress and PT POC with patient    UBE UEs only L3x4 min forward/x4 min backward   Thoracic AROM x10 each direction with extra holds to allow tissues to stretch PRN  L stretch at counter 3x30 seconds     11/09/23 Therex Seated hamstring stretch x 30" Supine hip flexor stretch x 30" Supine hip flexor + quad stretch with strap x30" Supine glute set x10 Supine ITB stretch with strap x30" Supine hip abd x10 Standing lumbar ext x5  Therapeutic Activity Standing shoulder ext red TB x10 Bow and arrow red TB x10 Row red TB x10 Hip hinge 2x5   11/07/23 Therex Standing lumbar ext x10 Standing "L" stretch 2x 30" with lateral flexion x30" Standing at counter thoracic rotation x10  Therapeutic Activity Seated thoracic ext against ball x10 Seated shoulder ER 2x10 Seated shoulder flexion AAROM with 1# WaTE BAR x5, no bar x5 Wall slide + glute setting in staggered stance 2x5 Standing hip hinge to wall 2x5   11/02/23 Therex Nustep UEs/LEs L3-4 x 6 min Supine LTR x10 Sidelying open/close book  Neuromuscular re-ed Supine PPT x10 Supine PPT + shoulder flexion alternating x10 Supine scap squeeze x10 Supine shoulder ER x10  Manual therapy STM & TPR R periscapular muscles, QL, lats  Self Care Self massage and myofacial release with tennis ball   10/23/23 TherEx NuStep UEs/LES seat 9 within available knee range x 8 min; L5 Seated hamstring stretch 3x30 sec bil Seated pelvic tilts 10  x 3 sec hold Seated trunk rotation x 10 reps each direction; 5 sec hold Seated overhead reach with 2# med ball x 10 reps - cues for core engagement Standing lumbar stretch forward flexion 3x30 sec hold mid/Lt/Rt   10/20/23  Exam, POC, HEP  Education on exam findings, exercise form, plan moving forward, role of core strength/joint mobility/hip strength in addressing her sx and goals  HEP practice of below  PATIENT EDUCATION:  Education details: see above  Person educated: Patient Education method: Explanation, Demonstration, and Handouts Education comprehension: verbalized understanding, returned demonstration, and needs further education  HOME EXERCISE PROGRAM: Access Code: [email or web address] URL: [email or web address] Date: 11/07/2023 Prepared by: Gellen April Erman Hayward  Exercises - Standing Lumbar Spine Flexion Stretch Counter  - 2 x daily - 7 x weekly - 1 sets - 3 reps - 30 seconds  hold - Seated Hamstring Stretch  - 2 x daily - 7 x weekly - 1 sets - 3 reps - 30 seconds  hold - Seated Posterior Pelvic Tilt  - 2 x daily - 7 x weekly - 1 sets - 10 reps - 3 seconds  hold - Supine Transversus Abdominis Bracing - Hands on Stomach  - 2 x daily - 7 x weekly - 1 sets - 10 reps - 3 seconds  hold - Standing Quadratus Lumborum Mobilization with Small Ball on Wall  - 1 x daily - 7 x weekly - 1 sets - 10 reps - Sidelying Thoracic Rotation with Open Book  - 1 x daily - 7 x weekly - 1 sets - 3 reps - 10 sec hold  ASSESSMENT:  CLINICAL IMPRESSION:  Doing OK, we updated PSFS as well as MMT today. She seems to be making some subjective progress towards goals, although objective progress still seems to be a bit limited. Kept working on functional strength and postural retraining this session as time allowed and as pt tolerated. She does have some chronic  impairments which may be impacting speed of tissue response and recovery, will continue to end of POC/cert period and determine appropriate recommendations (DC vs continuation of skilled PT services) at that time.    OBJECTIVE IMPAIRMENTS: Abnormal gait, decreased activity tolerance, decreased mobility, difficulty walking, decreased ROM, decreased strength, hypomobility, increased fascial restrictions, increased muscle spasms, impaired flexibility, improper body mechanics, postural dysfunction, and pain.   ACTIVITY LIMITATIONS: carrying, lifting, bending, sitting, standing, squatting, sleeping, toileting, dressing, hygiene/grooming, and locomotion level  PARTICIPATION LIMITATIONS: meal prep, cleaning, laundry, shopping, community activity, and yard work  PERSONAL FACTORS: Age, Fitness, Past/current experiences, Social background, and Time since onset of injury/illness/exacerbation are also affecting patient's functional outcome.   REHAB POTENTIAL: Fair deconditioning, altered biomechanics from severely limited range in past TKR   CLINICAL DECISION MAKING: Evolving/moderate complexity  EVALUATION COMPLEXITY: Moderate   GOALS: Goals reviewed with patient? No  SHORT TERM GOALS: Target date: 11/17/2023    Will be compliant with appropriate progressive HEP GOAL STATUS: IN PROGRESS 11/09/23   2. Will demonstrate improved postural awareness with all functional tasks, use of ergonomic aides PRN/as desired GOAL STATUS: IN PROGRESS 11/09/23   3. Will demonstrate good functional biomechanics for bed mobility and floor to waist lifting mechanics  GOAL STATUS: IN PROGRESS 11/09/23  4. Lumbar ROM to be no more than 25% all planes of motion  GOAL STATUS: IN PROGRESS 11/09/23  LONG TERM GOALS: Target date: 12/15/2023    MMT to have improved by at least 1 grade all weak groups  Baseline:  Goal status: ONGOING 11/17/23  2.  Will be to stand and cook for at least 30 minutes without increase in  pain  Baseline:  Goal status: INITIAL  3.  Will be able to walk at least 3 blocks for exercise without increase in back pain  Baseline:  Goal status: INITIAL  4.  Pain to be no more than 4/10 at worst  Baseline:  Goal status: INITIAL  5.  PSFS to have improved by at least 3 points to show improved subjective function  Baseline:  Goal status: ONGOING 11/17/23    PLAN:  PT FREQUENCY: 2x/week  PT DURATION: 8 weeks  PLANNED INTERVENTIONS: 97750- Physical Performance Testing, 97110-Therapeutic exercises, 97530- Therapeutic activity, [id number]- Neuromuscular re-education, 97535- Self Care, [id number]- Manual therapy, [id number]- Aquatic Therapy, [id number]- Electrical stimulation (unattended), 97035- Ultrasound, Taping, and Dry Needling.  PLAN FOR NEXT SESSION: continue to work on lumbar/hip mobility and core strength, proximal strength, biomechanics, postural training, HEP updates PRN    Terrel Ferries, PT, DPT 11/17/23 10:58 AM

## 2023-11-21 DIAGNOSIS — Z961 Presence of intraocular lens: Secondary | ICD-10-CM | POA: Diagnosis not present

## 2023-11-21 DIAGNOSIS — H35033 Hypertensive retinopathy, bilateral: Secondary | ICD-10-CM | POA: Diagnosis not present

## 2023-11-21 DIAGNOSIS — H43813 Vitreous degeneration, bilateral: Secondary | ICD-10-CM | POA: Diagnosis not present

## 2023-11-22 ENCOUNTER — Ambulatory Visit (INDEPENDENT_AMBULATORY_CARE_PROVIDER_SITE_OTHER): Admitting: Physical Therapy

## 2023-11-22 ENCOUNTER — Encounter: Payer: Self-pay | Admitting: Physical Therapy

## 2023-11-22 DIAGNOSIS — M546 Pain in thoracic spine: Secondary | ICD-10-CM

## 2023-11-22 DIAGNOSIS — M6281 Muscle weakness (generalized): Secondary | ICD-10-CM

## 2023-11-22 DIAGNOSIS — R293 Abnormal posture: Secondary | ICD-10-CM

## 2023-11-22 DIAGNOSIS — M5459 Other low back pain: Secondary | ICD-10-CM | POA: Diagnosis not present

## 2023-11-22 NOTE — Therapy (Signed)
 OUTPATIENT PHYSICAL THERAPY THORACOLUMBAR TREATMENT   Patient Name: [name] MRN: [id number] DOB:1940/09/23, 83 y.o., female Today's Date: 11/22/2023  END OF SESSION:  PT End of Session - 11/22/23 1018     Visit Number 7    Number of Visits 17    Date for PT Re-Evaluation 12/15/23    Authorization Type MCR    Authorization Time Period 10/20/23 to 12/15/23    Progress Note Due on Visit 10    PT Start Time 1015    PT Stop Time 1056    PT Time Calculation (min) 41 min    Activity Tolerance Patient tolerated treatment well    Behavior During Therapy WFL for tasks assessed/performed                Past Medical History:  Diagnosis Date   Asthma    allergic induced   Cough    Hypertension    Rhinitis    Past Surgical History:  Procedure Laterality Date   ABDOMINAL HYSTERECTOMY     BACK SURGERY     CATARACT EXTRACTION     MASS EXCISION  01/05/2012   Procedure: MINOR EXCISION OF MASS;  Surgeon: Amelie Baize., MD;  Location: [facility];  Service: Orthopedics;  Laterality: Right;  excision mucoid cyst right long finger, debride DIP joint   ROTATOR CUFF REPAIR Bilateral    TOTAL KNEE ARTHROPLASTY Left    twice   Patient Active Problem List   Diagnosis Date Noted   Arthritis of right hip 03/21/2022   Reactive airway disease 07/29/2020   Seasonal and perennial allergic rhinitis 07/29/2020   Gastroesophageal reflux disease 07/29/2020   Unilateral primary osteoarthritis, right knee 11/21/2017   Status post revision of total knee, left 11/21/2017   Chronic rhinitis 02/25/2016   Cough 02/25/2016   Hoarseness of voice 02/25/2016   Chronic leukopenia 12/15/2015    PCP: Jonathon Neighbors MD   REFERRING PROVIDER: Reuben Castilla, NP  REFERRING DIAG: M54.50 (ICD-10-CM) - Low back pain, unspecified back pain laterality, unspecified chronicity, unspecified whether sciatica present  Rationale for Evaluation and Treatment: Rehabilitation  THERAPY  DIAG:  Abnormal posture  Muscle weakness (generalized)  Other low back pain  Pain in thoracic spine  ONSET DATE: late February/early march 2025  SUBJECTIVE:                                                                                                                                                                                           SUBJECTIVE STATEMENT: Doing well; wants to work on reaching the floor to be able to garden.   PERTINENT  HISTORY:  See above L knee flexion up to 35 deg limited from prior surgery  PAIN:  Are you having pain? Yes: NPRS scale: 4/10 Pain location: low back this morning  Pain description: sometimes shooting/burning feeling Aggravating factors: standing for a long time, twisting, bending Relieving factors: heat, exercises   PRECAUTIONS: None  RED FLAGS: None   WEIGHT BEARING RESTRICTIONS: No  FALLS:  Has patient fallen in last 6 months? 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  DIAGNOSTIC FINDINGS:   Narrative & Impression  CLINICAL DATA:  HEAD INJURY, fall onto face 3 days ago, upper right anterior swelling with dizziness and headache.   EXAM: SKULL - COMPLETE 4 + VIEW   COMPARISON:  None Available.   FINDINGS: There is no evidence of skull fracture or other focal bone lesions. No mandibular fracture visualized. No air-fluid levels within the paranasal sinuses. Moderate degenerative changes of the atlantoaxial joint.    IMPRESSION:   Radiographs of the thoracic spine show moderate to severe degenerative  changes with loss of disc height as well as endplate spurring.  No acute  finding.  PATIENT SURVEYS:   Patient-Specific Activity Scoring Scheme  "0" represents "unable to perform." "10" represents "able to perform at prior level. 0 1 2 3 4 5 6 7 8 9  10 (Date and Score)   Activity Eval  11/17/23   1. Standing for a long time  3  3   2. Bending  3  4   3. Turning  2 3  4.Cooking  4 5  5. Walking  4 3  Score 3.2 3.6   Total score = sum of the activity scores/number of activities Minimum detectable change (90%CI) for average score = 2 points Minimum detectable change (90%CI) for single activity score = 3 points     COGNITION: Overall cognitive status: Within functional limits for tasks assessed     SENSATION: Not tested  MUSCLE LENGTH:  HS mod limitation B    POSTURE: rounded shoulders, forward head, decreased lumbar lordosis, and increased thoracic kyphosis  PALPATION: R lumbar paraspinals tight and tender   LUMBAR ROM:   AROM eval  Flexion Finger tips to toes but had to walk back up legs with UEs   Extension Mod limitation  Right lateral flexion Mod limitation- sharp pain   Left lateral flexion Mild limitation, stretch only   Right rotation Severe limitation   Left rotation Severe limitation   (Blank rows = not tested)  LOWER EXTREMITY ROM:     Active  Right eval Left eval  Knee flexion  35* chronic due to issues with past 2 TKRs    (Blank rows = not tested)  LOWER EXTREMITY MMT:    MMT Right eval Left eval Right 11/17/23 Left 11/17/23  Hip flexion 3 3- 4- 4-  Hip extension      Hip abduction 3 3- 3 3-  Hip adduction      Hip internal rotation      Hip external rotation  Knee flexion 4+ 4- 4 Dnt   Knee extension 4+ 4 4 Dnt   Ankle dorsiflexion 4+ 4 4+ 4+  Ankle plantarflexion      Ankle inversion      Ankle eversion       (Blank rows = not  tested)    TREATMENT DATE:  11/22/23 TherEx UBE L3 x 8 min (4 min each direction) Standing L counter stretch 3x30 sec   TherAct RDL with 5 sec hold at end range and bolster utilized for form - 2x10 working on extending reach to floor; 2nd set holding 5# dumbbells Rows L4 band 2x10; 5 sec hold Standing horizontal abduction L4 band 2x10 Standing alternating hip abduction L3 band 2x10 bil   11/17/23  PSFS, MMT, goal check/general discussion of progress and PT POC with patient    UBE UEs only L3x4 min forward/x4 min backward   Thoracic AROM x10 each direction with extra holds to allow tissues to stretch PRN  L stretch at counter 3x30 seconds     11/09/23 Therex Seated hamstring stretch x 30" Supine hip flexor stretch x 30" Supine hip flexor + quad stretch with strap x30" Supine glute set x10 Supine ITB stretch with strap x30" Supine hip abd x10 Standing lumbar ext x5  Therapeutic Activity Standing shoulder ext red TB x10 Bow and arrow red TB x10 Row red TB x10 Hip hinge 2x5   11/07/23 Therex Standing lumbar ext x10 Standing "L" stretch 2x 30" with lateral flexion x30" Standing at counter thoracic rotation x10  Therapeutic Activity Seated thoracic ext against ball x10 Seated shoulder ER 2x10 Seated shoulder flexion AAROM with 1# WaTE BAR x5, no bar x5 Wall slide + glute setting in staggered stance 2x5 Standing hip hinge to wall 2x5   11/02/23 Therex Nustep UEs/LEs L3-4 x 6 min Supine LTR x10 Sidelying open/close book  Neuromuscular re-ed Supine PPT x10 Supine PPT + shoulder flexion alternating x10 Supine scap squeeze x10 Supine shoulder ER x10  Manual therapy STM & TPR R periscapular muscles, QL, lats  Self Care Self massage and myofacial release with tennis ball                                                                                                                PATIENT EDUCATION:  Education details: see above  Person educated:  Patient Education method: Explanation, Demonstration, and Handouts Education comprehension: verbalized understanding, returned demonstration, and needs further education  HOME EXERCISE PROGRAM: Access Code: [email or web address] URL: [email or web address] Date: 11/07/2023 Prepared by: Gellen April Erman Hayward  Exercises - Standing Lumbar Spine Flexion Stretch Counter  - 2 x daily - 7 x weekly - 1 sets - 3 reps - 30 seconds  hold - Seated Hamstring Stretch  - 2 x daily - 7 x weekly - 1 sets - 3 reps - 30 seconds  hold - Seated Posterior Pelvic Tilt  - 2 x daily - 7 x weekly - 1 sets - 10 reps - 3 seconds  hold - Supine Transversus Abdominis  Bracing - Hands on Stomach  - 2 x daily - 7 x weekly - 1 sets - 10 reps - 3 seconds  hold - Standing Quadratus Lumborum Mobilization with Small Ball on Wall  - 1 x daily - 7 x weekly - 1 sets - 10 reps - Sidelying Thoracic Rotation with Open Book  - 1 x daily - 7 x weekly - 1 sets - 3 reps - 10 sec hold  ASSESSMENT:  CLINICAL IMPRESSION: Pt tolerated session well today working on strengthening and functional tasks to reach floor at pt's request.  Continue skilled PT at this time.    OBJECTIVE IMPAIRMENTS: Abnormal gait, decreased activity tolerance, decreased mobility, difficulty walking, decreased ROM, decreased strength, hypomobility, increased fascial restrictions, increased muscle spasms, impaired flexibility, improper body mechanics, postural dysfunction, and pain.   ACTIVITY LIMITATIONS: carrying, lifting, bending, sitting, standing, squatting, sleeping, toileting, dressing, hygiene/grooming, and locomotion level  PARTICIPATION LIMITATIONS: meal prep, cleaning, laundry, shopping, community activity, and yard work  PERSONAL FACTORS: Age, Fitness, Past/current experiences, Social background, and Time since onset of injury/illness/exacerbation are also affecting patient's functional outcome.   REHAB POTENTIAL: Fair deconditioning, altered  biomechanics from severely limited range in past TKR   CLINICAL DECISION MAKING: Evolving/moderate complexity  EVALUATION COMPLEXITY: Moderate   GOALS: Goals reviewed with patient? No  SHORT TERM GOALS: Target date: 11/17/2023    Will be compliant with appropriate progressive HEP GOAL STATUS: IN PROGRESS 11/09/23   2. Will demonstrate improved postural awareness with all functional tasks, use of ergonomic aides PRN/as desired GOAL STATUS: IN PROGRESS 11/09/23   3. Will demonstrate good functional biomechanics for bed mobility and floor to waist lifting mechanics  GOAL STATUS: IN PROGRESS 11/09/23  4. Lumbar ROM to be no more than 25% all planes of motion  GOAL STATUS: IN PROGRESS 11/09/23  LONG TERM GOALS: Target date: 12/15/2023    MMT to have improved by at least 1 grade all weak groups  Baseline:  Goal status: ONGOING 11/17/23  2.  Will be to stand and cook for at least 30 minutes without increase in pain  Baseline:  Goal status: INITIAL  3.  Will be able to walk at least 3 blocks for exercise without increase in back pain  Baseline:  Goal status: INITIAL  4.  Pain to be no more than 4/10 at worst  Baseline:  Goal status: INITIAL  5.  PSFS to have improved by at least 3 points to show improved subjective function  Baseline:  Goal status: ONGOING 11/17/23    PLAN:  PT FREQUENCY: 2x/week  PT DURATION: 8 weeks  PLANNED INTERVENTIONS: 97750- Physical Performance Testing, 97110-Therapeutic exercises, 97530- Therapeutic activity, [id number]- Neuromuscular re-education, 97535- Self Care, [id number]- Manual therapy, [id number]- Aquatic Therapy, [id number]- Electrical stimulation (unattended), 97035- Ultrasound, Taping, and Dry Needling.  PLAN FOR NEXT SESSION: work on deadlifts and eccentric hamstring strengthening/lengthening,  continue to work on lumbar/hip mobility and core strength, proximal strength, biomechanics, postural training, HEP updates PRN     Marley Simmers, PT,  DPT 11/22/23 11:24 AM

## 2023-11-24 ENCOUNTER — Encounter: Payer: Self-pay | Admitting: Physical Therapy

## 2023-11-24 ENCOUNTER — Ambulatory Visit (INDEPENDENT_AMBULATORY_CARE_PROVIDER_SITE_OTHER): Admitting: Physical Therapy

## 2023-11-24 DIAGNOSIS — M6281 Muscle weakness (generalized): Secondary | ICD-10-CM

## 2023-11-24 DIAGNOSIS — M546 Pain in thoracic spine: Secondary | ICD-10-CM | POA: Diagnosis not present

## 2023-11-24 DIAGNOSIS — M5459 Other low back pain: Secondary | ICD-10-CM

## 2023-11-24 DIAGNOSIS — R293 Abnormal posture: Secondary | ICD-10-CM | POA: Diagnosis not present

## 2023-11-24 NOTE — Therapy (Signed)
 OUTPATIENT PHYSICAL THERAPY THORACOLUMBAR TREATMENT   Patient Name: [name] MRN: [id number] DOB:Jan 12, 1941, 83 y.o., female Today's Date: 11/24/2023  END OF SESSION:  PT End of Session - 11/24/23 1014     Visit Number 8    Number of Visits 17    Date for PT Re-Evaluation 12/15/23    Authorization Type MCR    Authorization Time Period 10/20/23 to 12/15/23    Progress Note Due on Visit 10    PT Start Time 1014    PT Stop Time 1055    PT Time Calculation (min) 41 min    Activity Tolerance Patient tolerated treatment well    Behavior During Therapy WFL for tasks assessed/performed                 Past Medical History:  Diagnosis Date   Asthma    allergic induced   Cough    Hypertension    Rhinitis    Past Surgical History:  Procedure Laterality Date   ABDOMINAL HYSTERECTOMY     BACK SURGERY     CATARACT EXTRACTION     MASS EXCISION  01/05/2012   Procedure: MINOR EXCISION OF MASS;  Surgeon: Amelie Baize., MD;  Location: [facility];  Service: Orthopedics;  Laterality: Right;  excision mucoid cyst right long finger, debride DIP joint   ROTATOR CUFF REPAIR Bilateral    TOTAL KNEE ARTHROPLASTY Left    twice   Patient Active Problem List   Diagnosis Date Noted   Arthritis of right hip 03/21/2022   Reactive airway disease 07/29/2020   Seasonal and perennial allergic rhinitis 07/29/2020   Gastroesophageal reflux disease 07/29/2020   Unilateral primary osteoarthritis, right knee 11/21/2017   Status post revision of total knee, left 11/21/2017   Chronic rhinitis 02/25/2016   Cough 02/25/2016   Hoarseness of voice 02/25/2016   Chronic leukopenia 12/15/2015    PCP: Jonathon Neighbors MD   REFERRING PROVIDER: Reuben Castilla, NP  REFERRING DIAG: M54.50 (ICD-10-CM) - Low back pain, unspecified back pain laterality, unspecified chronicity, unspecified whether sciatica present  Rationale for Evaluation and Treatment: Rehabilitation  THERAPY  DIAG:  Abnormal posture  Muscle weakness (generalized)  Other low back pain  Pain in thoracic spine  ONSET DATE: late February/early march 2025  SUBJECTIVE:                                                                                                                                                                                           SUBJECTIVE STATEMENT: Working on deadlifts at home   PERTINENT HISTORY:  See above L knee flexion up  to 35 deg limited from prior surgery  PAIN:  Are you having pain? Yes: NPRS scale: 4/10 Pain location: low back this morning  Pain description: sometimes shooting/burning feeling Aggravating factors: standing for a long time, twisting, bending Relieving factors: heat, exercises   PRECAUTIONS: None  RED FLAGS: None   WEIGHT BEARING RESTRICTIONS: No  FALLS:  Has patient fallen in last 6 months? No; some FOF   LIVING ENVIRONMENT: Lives with: lives alone Lives in: House/apartment Stairs: 2 STE no rails; flight inside but can stay on main level  Has following equipment at home: Grab bars  OCCUPATION: retiredAssociate Professor in [employer]  PLOF: Independent, Independent with basic ADLs, Independent with gait, and Independent with transfers  PATIENT GOALS: get back to prior level of function   NEXT MD VISIT: Referring PRN   OBJECTIVE:  Note: Objective measures were completed at Evaluation unless otherwise noted.  DIAGNOSTIC FINDINGS:   Narrative & Impression  CLINICAL DATA:  HEAD INJURY, fall onto face 3 days ago, upper right anterior swelling with dizziness and headache.   EXAM: SKULL - COMPLETE 4 + VIEW   COMPARISON:  None Available.   FINDINGS: There is no evidence of skull fracture or other focal bone lesions. No mandibular fracture visualized. No air-fluid levels within the paranasal sinuses. Moderate degenerative changes of the atlantoaxial joint.   IMPRESSION:   Radiographs of the thoracic  spine show moderate to severe degenerative  changes with loss of disc height as well as endplate spurring.  No acute  finding.  PATIENT SURVEYS:   Patient-Specific Activity Scoring Scheme  "0" represents "unable to perform." "10" represents "able to perform at prior level. 0 1 2 3 4 5 6 7 8 9  10 (Date and Score)   Activity Eval  11/17/23   1. Standing for a long time  3  3   2. Bending  3  4   3. Turning  2 3  4.Cooking  4 5  5. Walking  4 3  Score 3.2 3.6   Total score = sum of the activity scores/number of activities Minimum detectable change (90%CI) for average score = 2 points Minimum detectable change (90%CI) for single activity score = 3 points     COGNITION: Overall cognitive status: Within functional limits for tasks assessed     SENSATION: Not tested  MUSCLE LENGTH:  HS mod limitation B    POSTURE: rounded shoulders, forward head, decreased lumbar lordosis, and increased thoracic kyphosis  PALPATION: R lumbar paraspinals tight and tender   LUMBAR ROM:   AROM eval 11/24/23  Flexion Finger tips to toes but had to walk back up legs with UEs  To toes; no difficulty returning to standing  Extension Mod limitation   Right lateral flexion Mod limitation- sharp pain    Left lateral flexion Mild limitation, stretch only    Right rotation Severe limitation    Left rotation Severe limitation    (Blank rows = not tested)  LOWER EXTREMITY ROM:     Active  Right eval Left eval  Knee flexion  35* chronic due to issues with past 2 TKRs    (Blank rows = not tested)  LOWER EXTREMITY MMT:    MMT Right eval Left eval Right 11/17/23 Left 11/17/23  Hip flexion 3 3- 4- 4-  Hip extension      Hip abduction 3 3- 3 3-  Hip adduction      Hip internal rotation  Hip external rotation      Knee flexion 4+ 4- 4 Dnt   Knee extension 4+ 4 4 Dnt   Ankle dorsiflexion 4+ 4 4+ 4+  Ankle plantarflexion      Ankle inversion      Ankle eversion       (Blank rows = not  tested)    TREATMENT DATE:  11/24/23 TherEx UBE L3 x 8 min (4 min each direction) Heel on 8" step 20 sec hold; then opp hand toe touch x 10 reps Standing L counter stretch 3x30 sec  Prone hip extension x 10 reps bil; 3 sec hold Prone opp arm/leg raise x 5 bil; 3 sec hold (increased difficulty)  TherAct RDL with 5 sec hold at end range and bolster utilized for form - 2x10 working on extending reach to floor; 6# dumbbells for both sets Trial of 5 reps without bolster with proper form noted   11/22/23 TherEx UBE L3 x 8 min (4 min each direction) Standing L counter stretch 3x30 sec   TherAct RDL with 5 sec hold at end range and bolster utilized for form - 2x10 working on extending reach to floor; 2nd set holding 5# dumbbells Rows L4 band 2x10; 5 sec hold Standing horizontal abduction L4 band 2x10 Standing alternating hip abduction L3 band 2x10 bil   11/17/23 PSFS, MMT, goal check/general discussion of progress and PT POC with patient    UBE UEs only L3x4 min forward/x4 min backward   Thoracic AROM x10 each direction with extra holds to allow tissues to stretch PRN  L stretch at counter 3x30 seconds     11/09/23 Therex Seated hamstring stretch x 30" Supine hip flexor stretch x 30" Supine hip flexor + quad stretch with strap x30" Supine glute set x10 Supine ITB stretch with strap x30" Supine hip abd x10 Standing lumbar ext x5  Therapeutic Activity Standing shoulder ext red TB x10 Bow and arrow red TB x10 Row red TB x10 Hip hinge 2x5    PATIENT EDUCATION:  Education details: see above  Person educated: Patient Education method: Programmer, multimedia, Demonstration, and Handouts Education comprehension: verbalized understanding, returned demonstration, and needs further education  HOME EXERCISE PROGRAM: Access Code: [email or web address] URL: [email or web address] Date: 11/07/2023 Prepared by: Gellen April Erman Hayward  Exercises - Standing Lumbar Spine Flexion  Stretch Counter  - 2 x daily - 7 x weekly - 1 sets - 3 reps - 30 seconds  hold - Seated Hamstring Stretch  - 2 x daily - 7 x weekly - 1 sets - 3 reps - 30 seconds  hold - Seated Posterior Pelvic Tilt  - 2 x daily - 7 x weekly - 1 sets - 10 reps - 3 seconds  hold - Supine Transversus Abdominis Bracing - Hands on Stomach  - 2 x daily - 7 x weekly - 1 sets - 10 reps - 3 seconds  hold - Standing Quadratus Lumborum Mobilization with Small Ball on Wall  - 1 x daily - 7 x weekly - 1 sets - 10 reps - Sidelying Thoracic Rotation with Open Book  - 1 x daily - 7 x weekly - 1 sets - 3 reps - 10 sec hold  ASSESSMENT:  CLINICAL IMPRESSION: Pt tolerated session well today with continued work on strengthening as well as functional activities to reach there floor.  Will continue to benefit from PT to maximize function.    OBJECTIVE IMPAIRMENTS: Abnormal gait, decreased activity tolerance, decreased mobility, difficulty walking, decreased ROM, decreased strength,  hypomobility, increased fascial restrictions, increased muscle spasms, impaired flexibility, improper body mechanics, postural dysfunction, and pain.   ACTIVITY LIMITATIONS: carrying, lifting, bending, sitting, standing, squatting, sleeping, toileting, dressing, hygiene/grooming, and locomotion level  PARTICIPATION LIMITATIONS: meal prep, cleaning, laundry, shopping, community activity, and yard work  PERSONAL FACTORS: Age, Fitness, Past/current experiences, Social background, and Time since onset of injury/illness/exacerbation are also affecting patient's functional outcome.   REHAB POTENTIAL: Fair deconditioning, altered biomechanics from severely limited range in past TKR   CLINICAL DECISION MAKING: Evolving/moderate complexity  EVALUATION COMPLEXITY: Moderate   GOALS: Goals reviewed with patient? No  SHORT TERM GOALS: Target date: 11/17/2023    Will be compliant with appropriate progressive HEP GOAL STATUS: IN PROGRESS 11/09/23   2.  Will demonstrate improved postural awareness with all functional tasks, use of ergonomic aides PRN/as desired GOAL STATUS: IN PROGRESS 11/09/23   3. Will demonstrate good functional biomechanics for bed mobility and floor to waist lifting mechanics  GOAL STATUS: IN PROGRESS 11/09/23  4. Lumbar ROM to be no more than 25% all planes of motion  GOAL STATUS: IN PROGRESS 11/09/23  LONG TERM GOALS: Target date: 12/15/2023    MMT to have improved by at least 1 grade all weak groups  Baseline:  Goal status: ONGOING 11/17/23  2.  Will be to stand and cook for at least 30 minutes without increase in pain  Baseline:  Goal status: INITIAL  3.  Will be able to walk at least 3 blocks for exercise without increase in back pain  Baseline:  Goal status: INITIAL  4.  Pain to be no more than 4/10 at worst  Baseline:  Goal status: INITIAL  5.  PSFS to have improved by at least 3 points to show improved subjective function  Baseline:  Goal status: ONGOING 11/17/23    PLAN:  PT FREQUENCY: 2x/week  PT DURATION: 8 weeks  PLANNED INTERVENTIONS: 97750- Physical Performance Testing, 97110-Therapeutic exercises, 97530- Therapeutic activity, [id number]- Neuromuscular re-education, 97535- Self Care, 91478- Manual therapy, [id number]- Aquatic Therapy, [id number]- Electrical stimulation (unattended), 97035- Ultrasound, Taping, and Dry Needling.  PLAN FOR NEXT SESSION: continue work on deadlifts and eccentric hamstring strengthening/lengthening,  continue to work on lumbar/hip mobility and core strength, proximal strength, biomechanics, postural training, HEP updates PRN     Marley Simmers, PT, DPT 11/24/23 10:58 AM

## 2023-12-04 ENCOUNTER — Ambulatory Visit (INDEPENDENT_AMBULATORY_CARE_PROVIDER_SITE_OTHER): Admitting: Physical Therapy

## 2023-12-04 ENCOUNTER — Encounter: Payer: Self-pay | Admitting: Physical Therapy

## 2023-12-04 DIAGNOSIS — M5459 Other low back pain: Secondary | ICD-10-CM | POA: Diagnosis not present

## 2023-12-04 DIAGNOSIS — M546 Pain in thoracic spine: Secondary | ICD-10-CM

## 2023-12-04 DIAGNOSIS — R293 Abnormal posture: Secondary | ICD-10-CM | POA: Diagnosis not present

## 2023-12-04 DIAGNOSIS — M6281 Muscle weakness (generalized): Secondary | ICD-10-CM

## 2023-12-04 NOTE — Therapy (Signed)
 OUTPATIENT PHYSICAL THERAPY THORACOLUMBAR TREATMENT   Patient Name: [name] MRN: [id number] DOB:[birth year], 83 y.o., female Today's Date: 12/04/2023  END OF SESSION:  PT End of Session - 12/04/23 0933     Visit Number 9    Number of Visits 17    Date for PT Re-Evaluation 12/15/23    Authorization Type MCR    Authorization Time Period 10/20/23 to 12/15/23    Progress Note Due on Visit 10    PT Start Time 0933    PT Stop Time 1012    PT Time Calculation (min) 39 min    Activity Tolerance Patient tolerated treatment well    Behavior During Therapy [facility] for tasks assessed/performed               Past Medical History:  Diagnosis Date   Asthma    allergic induced   Cough    Hypertension    Rhinitis    Past Surgical History:  Procedure Laterality Date   ABDOMINAL HYSTERECTOMY     BACK SURGERY     CATARACT EXTRACTION     MASS EXCISION  01/05/2012   Procedure: MINOR EXCISION OF MASS;  Surgeon: Amelie Baize., MD;  Location: [facility];  Service: Orthopedics;  Laterality: Right;  excision mucoid cyst right long finger, debride DIP joint   ROTATOR CUFF REPAIR Bilateral    TOTAL KNEE ARTHROPLASTY Left    twice   Patient Active Problem List   Diagnosis Date Noted   Arthritis of right hip 03/21/2022   Reactive airway disease 07/29/2020   Seasonal and perennial allergic rhinitis 07/29/2020   Gastroesophageal reflux disease 07/29/2020   Unilateral primary osteoarthritis, right knee 11/21/2017   Status post revision of total knee, left 11/21/2017   Chronic rhinitis 02/25/2016   Cough 02/25/2016   Hoarseness of voice 02/25/2016   Chronic leukopenia 12/15/2015    PCP: Jonathon Neighbors MD   REFERRING PROVIDER: Reuben Castilla, NP  REFERRING DIAG: M54.50 (ICD-10-CM) - Low back pain, unspecified back pain laterality, unspecified chronicity, unspecified whether sciatica present  Rationale for Evaluation and Treatment: Rehabilitation  THERAPY  DIAG:  Abnormal posture  Muscle weakness (generalized)  Other low back pain  Pain in thoracic spine  ONSET DATE: late February/early march 2025  SUBJECTIVE:                                                                                                                                                                                           SUBJECTIVE STATEMENT: Doing well, pain is minimal today and since last visit  PERTINENT HISTORY:  See above L knee  flexion up to 35 deg limited from prior surgery  PAIN:  Are you having pain? Yes: NPRS scale: 1/10 Pain location: low back this morning  Pain description: sometimes shooting/burning feeling Aggravating factors: standing for a long time, twisting, bending Relieving factors: heat, exercises   PRECAUTIONS: None  RED FLAGS: None   WEIGHT BEARING RESTRICTIONS: No  FALLS:  Has patient fallen in last 6 months? 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  DIAGNOSTIC FINDINGS:   Narrative & Impression  CLINICAL DATA:  HEAD INJURY, fall onto face 3 days ago, upper right anterior swelling with dizziness and headache.   EXAM: SKULL - COMPLETE 4 + VIEW   COMPARISON:  None Available.   FINDINGS: There is no evidence of skull fracture or other focal bone lesions. No mandibular fracture visualized. No air-fluid levels within the paranasal sinuses. Moderate degenerative changes of the atlantoaxial joint.   IMPRESSION:    Radiographs of the thoracic spine show moderate to severe degenerative  changes with loss of disc height as well as endplate spurring.  No acute  finding.  PATIENT SURVEYS:   Patient-Specific Activity Scoring Scheme  0 represents "unable to perform." 10 represents "able to perform at prior level. 0 1 2 3 4 5 6 7 8 9  10 (Date and Score)   Activity Eval  11/17/23  12/04/23  1. Standing for a long time  3  3  6   2. Bending  3  4  9   3. Turning  2 3 8   4.Cooking  4 5 7   5. Walking  4 3 8   Score 3.2 3.6 7.6   Total score = sum of the activity scores/number of activities Minimum detectable change (90%CI) for average score = 2 points Minimum detectable change (90%CI) for single activity score = 3 points     COGNITION: Overall cognitive status: Within functional limits for tasks assessed     SENSATION: Not tested  MUSCLE LENGTH:  HS mod limitation B    POSTURE: rounded shoulders, forward head, decreased lumbar lordosis, and increased thoracic kyphosis  PALPATION: R lumbar paraspinals tight and tender   LUMBAR ROM:   AROM eval 11/24/23  Flexion Finger tips to toes but had to walk back up legs with UEs  To toes; no difficulty returning to standing  Extension Mod limitation   Right lateral flexion Mod limitation- sharp pain    Left lateral flexion Mild limitation, stretch only    Right rotation Severe limitation    Left rotation Severe limitation    (Blank rows = not tested)  LOWER EXTREMITY ROM:     Active  Right eval Left eval  Knee flexion  35* chronic due to issues with past 2 TKRs    (Blank rows = not tested)  LOWER EXTREMITY MMT:    MMT Right eval Left eval Right 11/17/23 Left 11/17/23  Hip flexion 3 3- 4- 4-  Hip extension      Hip abduction 3 3- 3 3-  Hip adduction  Hip internal rotation      Hip external rotation      Knee flexion 4+ 4- 4 Dnt   Knee extension 4+ 4 4 Dnt   Ankle dorsiflexion 4+ 4 4+ 4+  Ankle plantarflexion      Ankle  inversion      Ankle eversion       (Blank rows = not tested)    TREATMENT DATE:  12/04/23 TherEx UBE L3 x 8 min (4 min each direction)  Hamstring curls within available range 25# with slow eccentrics control x10  TherAct RDL with 5 sec hold at end range 3x10 working on extending reach to floor; 8# dumbbells Heel on 4 step reaching to toe with opposite hand 2x10 bil; 1 UE support   11/24/23 TherEx UBE L3 x 8 min (4 min each direction) Heel on 8 step 20 sec hold; then opp hand toe touch x 10 reps Standing L counter stretch 3x30 sec  Prone hip extension x 10 reps bil; 3 sec hold Prone opp arm/leg raise x 5 bil; 3 sec hold (increased difficulty)  TherAct RDL with 5 sec hold at end range and bolster utilized for form - 2x10 working on extending reach to floor; 6# dumbbells for both sets Trial of 5 reps without bolster with proper form noted   11/22/23 TherEx UBE L3 x 8 min (4 min each direction) Standing L counter stretch 3x30 sec   TherAct RDL with 5 sec hold at end range and bolster utilized for form - 2x10 working on extending reach to floor; 2nd set holding 5# dumbbells Rows L4 band 2x10; 5 sec hold Standing horizontal abduction L4 band 2x10 Standing alternating hip abduction L3 band 2x10 bil   11/17/23 PSFS, MMT, goal check/general discussion of progress and PT POC with patient    UBE UEs only L3x4 min forward/x4 min backward   Thoracic AROM x10 each direction with extra holds to allow tissues to stretch PRN  L stretch at counter 3x30 seconds     11/09/23 Therex Seated hamstring stretch x 30 Supine hip flexor stretch x 30 Supine hip flexor + quad stretch with strap x30 Supine glute set x10 Supine ITB stretch with strap x30 Supine hip abd x10 Standing lumbar ext x5  Therapeutic Activity Standing shoulder ext red TB x10 Bow and arrow red TB x10 Row red TB x10 Hip hinge 2x5    PATIENT EDUCATION:  Education details: see above  Person educated:  Patient Education method: Programmer, multimedia, Demonstration, and Handouts Education comprehension: verbalized understanding, returned demonstration, and needs further education  HOME EXERCISE PROGRAM: Access Code: [email or web address] URL: [email or web address] Date: 11/07/2023 Prepared by: Gellen April Erman Hayward  Exercises - Standing Lumbar Spine Flexion Stretch Counter  - 2 x daily - 7 x weekly - 1 sets - 3 reps - 30 seconds  hold - Seated Hamstring Stretch  - 2 x daily - 7 x weekly - 1 sets - 3 reps - 30 seconds  hold - Seated Posterior Pelvic Tilt  - 2 x daily - 7 x weekly - 1 sets - 10 reps - 3 seconds  hold - Supine Transversus Abdominis Bracing - Hands on Stomach  - 2 x daily - 7 x weekly - 1 sets - 10 reps - 3 seconds  hold - Standing Quadratus Lumborum Mobilization with Small Ball on Wall  - 1 x daily - 7 x weekly - 1 sets - 10 reps - Sidelying Thoracic Rotation with Open Book  - 1 x  daily - 7 x weekly - 1 sets - 3 reps - 10 sec hold  ASSESSMENT:  CLINICAL IMPRESSION: Pt overall reporting minimal pain since last session and anticipate we are nearing d/c.  Plan to transition to HEP only after next visit if all is well.     OBJECTIVE IMPAIRMENTS: Abnormal gait, decreased activity tolerance, decreased mobility, difficulty walking, decreased ROM, decreased strength, hypomobility, increased fascial restrictions, increased muscle spasms, impaired flexibility, improper body mechanics, postural dysfunction, and pain.   ACTIVITY LIMITATIONS: carrying, lifting, bending, sitting, standing, squatting, sleeping, toileting, dressing, hygiene/grooming, and locomotion level  PARTICIPATION LIMITATIONS: meal prep, cleaning, laundry, shopping, community activity, and yard work  PERSONAL FACTORS: Age, Fitness, Past/current experiences, Social background, and Time since onset of injury/illness/exacerbation are also affecting patient's functional outcome.   REHAB POTENTIAL: Fair deconditioning,  altered biomechanics from severely limited range in past TKR   CLINICAL DECISION MAKING: Evolving/moderate complexity  EVALUATION COMPLEXITY: Moderate   GOALS: Goals reviewed with patient? No  SHORT TERM GOALS: Target date: 11/17/2023    Will be compliant with appropriate progressive HEP GOAL STATUS: IN PROGRESS 11/09/23   2. Will demonstrate improved postural awareness with all functional tasks, use of ergonomic aides PRN/as desired GOAL STATUS: IN PROGRESS 11/09/23   3. Will demonstrate good functional biomechanics for bed mobility and floor to waist lifting mechanics  GOAL STATUS: IN PROGRESS 11/09/23  4. Lumbar ROM to be no more than 25% all planes of motion  GOAL STATUS: IN PROGRESS 11/09/23  LONG TERM GOALS: Target date: 12/15/2023    MMT to have improved by at least 1 grade all weak groups  Baseline:  Goal status: ONGOING 11/17/23  2.  Will be to stand and cook for at least 30 minutes without increase in pain  Baseline:  Goal status: ONGOING (15 min) 12/04/23  3.  Will be able to walk at least 3 blocks for exercise without increase in back pain  Baseline:  Goal status: INITIAL  4.  Pain to be no more than 4/10 at worst  Baseline:  Goal status: INITIAL  5.  PSFS to have improved by at least 3 points to show improved subjective function  Baseline:  Goal status: MET 12/04/23    PLAN:  PT FREQUENCY: 2x/week  PT DURATION: 8 weeks  PLANNED INTERVENTIONS: 97750- Physical Performance Testing, 97110-Therapeutic exercises, 97530- Therapeutic activity, [id number]- Neuromuscular re-education, 97535- Self Care, [id number]- Manual therapy, [id number]- Aquatic Therapy, [id number]- Electrical stimulation (unattended), 97035- Ultrasound, Taping, and Dry Needling.  PLAN FOR NEXT SESSION: plan for d/c next visit; check remaining goals.    Marley Simmers, PT, DPT 12/04/23 11:32 AM

## 2023-12-08 ENCOUNTER — Encounter: Payer: Self-pay | Admitting: Physical Therapy

## 2023-12-08 ENCOUNTER — Ambulatory Visit: Admitting: Physical Therapy

## 2023-12-08 DIAGNOSIS — M546 Pain in thoracic spine: Secondary | ICD-10-CM

## 2023-12-08 DIAGNOSIS — M6281 Muscle weakness (generalized): Secondary | ICD-10-CM | POA: Diagnosis not present

## 2023-12-08 DIAGNOSIS — M5459 Other low back pain: Secondary | ICD-10-CM

## 2023-12-08 DIAGNOSIS — R293 Abnormal posture: Secondary | ICD-10-CM

## 2023-12-08 NOTE — Therapy (Signed)
 OUTPATIENT PHYSICAL THERAPY THORACOLUMBAR TREATMENT PROGRESS NOTE DISCHARGE SUMMARY  Progress Note Reporting Period 10/20/23 to 12/08/23  See note below for Objective Data and Assessment of Progress/Goals.    Patient Name: [name] MRN: [id number] DOB:12/09/1940, 83 y.o., female Today's Date: 12/08/2023  END OF SESSION:  PT End of Session - 12/08/23 1016     Visit Number 10    Number of Visits 17    Date for PT Re-Evaluation 12/15/23    Authorization Type MCR    Authorization Time Period 10/20/23 to 12/15/23    Progress Note Due on Visit 10    PT Start Time 1013    PT Stop Time 1036    PT Time Calculation (min) 23 min    Activity Tolerance Patient tolerated treatment well    Behavior During Therapy WFL for tasks assessed/performed                Past Medical History:  Diagnosis Date   Asthma    allergic induced   Cough    Hypertension    Rhinitis    Past Surgical History:  Procedure Laterality Date   ABDOMINAL HYSTERECTOMY     BACK SURGERY     CATARACT EXTRACTION     MASS EXCISION  01/05/2012   Procedure: MINOR EXCISION OF MASS;  Surgeon: Amelie Baize., MD;  Location: [facility];  Service: Orthopedics;  Laterality: Right;  excision mucoid cyst right long finger, debride DIP joint   ROTATOR CUFF REPAIR Bilateral    TOTAL KNEE ARTHROPLASTY Left    twice   Patient Active Problem List   Diagnosis Date Noted   Arthritis of right hip 03/21/2022   Reactive airway disease 07/29/2020   Seasonal and perennial allergic rhinitis 07/29/2020   Gastroesophageal reflux disease 07/29/2020   Unilateral primary osteoarthritis, right knee 11/21/2017   Status post revision of total knee, left 11/21/2017   Chronic rhinitis 02/25/2016   Cough 02/25/2016   Hoarseness of voice 02/25/2016   Chronic leukopenia 12/15/2015    PCP: Jonathon Neighbors MD   REFERRING PROVIDER: Reuben Castilla, NP  REFERRING DIAG: M54.50 (ICD-10-CM) - Low back pain,  unspecified back pain laterality, unspecified chronicity, unspecified whether sciatica present  Rationale for Evaluation and Treatment: Rehabilitation  THERAPY DIAG:  Abnormal posture  Muscle weakness (generalized)  Other low back pain  Pain in thoracic spine  ONSET DATE: late February/early march 2025  SUBJECTIVE:  SUBJECTIVE STATEMENT: No complaints; feels ready to d/c today.  Pain up to 3-4/10  PERTINENT HISTORY:  See above L knee flexion up to 35 deg limited from prior surgery  PAIN:  Are you having pain? Yes: NPRS scale: 1/10 Pain location: low back this morning  Pain description: sometimes shooting/burning feeling Aggravating factors: standing for a long time, twisting, bending Relieving factors: heat, exercises   PRECAUTIONS: None  RED FLAGS: None   WEIGHT BEARING RESTRICTIONS: No  FALLS:  Has patient fallen in last 6 months? No; some FOF   LIVING ENVIRONMENT: Lives with: lives alone Lives in: House/apartment Stairs: 2 STE no rails; flight inside but can stay on main level  Has following equipment at home: Grab bars  OCCUPATION: retiredAssociate Professor in [employer]  PLOF: Independent, Independent with basic ADLs, Independent with gait, and Independent with transfers  PATIENT GOALS: get back to prior level of function   NEXT MD VISIT: Referring PRN   OBJECTIVE:  Note: Objective measures were completed at Evaluation unless otherwise noted.  DIAGNOSTIC FINDINGS:   Narrative & Impression  CLINICAL DATA:  HEAD INJURY, fall onto face 3 days ago, upper right anterior swelling with dizziness and headache.   EXAM: SKULL - COMPLETE 4 + VIEW   COMPARISON:  None Available.   FINDINGS: There is no evidence of skull fracture or other focal bone  lesions. No mandibular fracture visualized. No air-fluid levels within the paranasal sinuses. Moderate degenerative changes of the atlantoaxial joint.   IMPRESSION:   Radiographs of the thoracic spine show moderate to severe degenerative  changes with loss of disc height as well as endplate spurring.  No acute  finding.  PATIENT SURVEYS:   Patient-Specific Activity Scoring Scheme  0 represents "unable to perform." 10 represents "able to perform at prior level. 0 1 2 3 4 5 6 7 8 9  10 (Date and Score)   Activity Eval  11/17/23  12/04/23  1. Standing for a long time  3  3  6   2. Bending  3  4  9   3. Turning  2 3 8   4.Cooking  4 5 7   5. Walking  4 3 8   Score 3.2 3.6 7.6   Total score = sum of the activity scores/number of activities Minimum detectable change (90%CI) for average score = 2 points Minimum detectable change (90%CI) for single activity score = 3 points     COGNITION: Overall cognitive status: Within functional limits for tasks assessed     SENSATION: Not tested  MUSCLE LENGTH:  HS mod limitation B    POSTURE: rounded shoulders, forward head, decreased lumbar lordosis, and increased thoracic kyphosis  PALPATION: R lumbar paraspinals tight and tender   LUMBAR ROM:   AROM eval 11/24/23  Flexion Finger tips to toes but had to walk back up legs with UEs  To toes; no difficulty returning to standing  Extension Mod limitation   Right lateral flexion Mod limitation- sharp pain    Left lateral flexion Mild limitation, stretch only    Right rotation Severe limitation    Left rotation Severe limitation    (Blank rows = not tested)  LOWER EXTREMITY ROM:     Active  Right eval Left eval  Knee flexion  35* chronic due to issues with past 2 TKRs    (Blank rows = not tested)  LOWER EXTREMITY MMT:    MMT Right eval Left eval Right  11/17/23 Left  11/17/23 Right/Left 12/08/23  Hip  flexion 3 3- 4- 4- 4/4+  Hip extension       Hip abduction 3 3- 3 3-  3/3  Hip adduction       Hip internal rotation       Hip external rotation       Knee flexion 4+ 4- 4 Dnt  5/5  Knee extension 4+ 4 4 Dnt  5/4  Ankle dorsiflexion 4+ 4 4+ 4+   Ankle plantarflexion       Ankle inversion       Ankle eversion        (Blank rows = not tested)    TREATMENT DATE:  12/08/23 TherEx UBE L4 x 8 min (4 min each direction)  MMT - see above for details Sidelying hip circles x 10 CW/CCW bil Educated on benefits of continued exercise and pt in agreement  TherAct Discussed increasing functional activities and tolerance to activities; when it's okay to push through some discomfort and when to stop due to pain; pt verbalized understanding.   12/04/23 TherEx UBE L3 x 8 min (4 min each direction)  Hamstring curls within available range 25# with slow eccentrics control x10  TherAct RDL with 5 sec hold at end range 3x10 working on extending reach to floor; 8# dumbbells Heel on 4 step reaching to toe with opposite hand 2x10 bil; 1 UE support   11/24/23 TherEx UBE L3 x 8 min (4 min each direction) Heel on 8 step 20 sec hold; then opp hand toe touch x 10 reps Standing L counter stretch 3x30 sec  Prone hip extension x 10 reps bil; 3 sec hold Prone opp arm/leg raise x 5 bil; 3 sec hold (increased difficulty)  TherAct RDL with 5 sec hold at end range and bolster utilized for form - 2x10 working on extending reach to floor; 6# dumbbells for both sets Trial of 5 reps without bolster with proper form noted   11/22/23 TherEx UBE L3 x 8 min (4 min each direction) Standing L counter stretch 3x30 sec   TherAct RDL with 5 sec hold at end range and bolster utilized for form - 2x10 working on extending reach to floor; 2nd set holding 5# dumbbells Rows L4 band 2x10; 5 sec hold Standing horizontal abduction L4 band 2x10 Standing alternating hip abduction L3 band 2x10 bil   11/17/23 PSFS, MMT, goal check/general discussion of progress and PT POC with patient    UBE  UEs only L3x4 min forward/x4 min backward   Thoracic AROM x10 each direction with extra holds to allow tissues to stretch PRN  L stretch at counter 3x30 seconds     11/09/23 Therex Seated hamstring stretch x 30 Supine hip flexor stretch x 30 Supine hip flexor + quad stretch with strap x30 Supine glute set x10 Supine ITB stretch with strap x30 Supine hip abd x10 Standing lumbar ext x5  Therapeutic Activity Standing shoulder ext red TB x10 Bow and arrow red TB x10 Row red TB x10 Hip hinge 2x5    PATIENT EDUCATION:  Education details: see above  Person educated: Patient Education method: Programmer, multimedia, Demonstration, and Handouts Education comprehension: verbalized understanding, returned demonstration, and needs further education  HOME EXERCISE PROGRAM: Access Code: [email or web address] URL: [email or web address] Date: 12/08/2023 Prepared by: Casimer Clear  Exercises - Standing Lumbar Spine Flexion Stretch Counter  - 2 x daily - 7 x weekly - 1 sets - 3 reps - 30 seconds  hold - Seated Hamstring Stretch  - 2 x daily - 7 x  weekly - 1 sets - 3 reps - 30 seconds  hold - Seated Posterior Pelvic Tilt  - 2 x daily - 7 x weekly - 1 sets - 10 reps - 3 seconds  hold - Supine Transversus Abdominis Bracing - Hands on Stomach  - 2 x daily - 7 x weekly - 1 sets - 10 reps - 3 seconds  hold - Standing Quadratus Lumborum Mobilization with Small Ball on Wall  - 1 x daily - 7 x weekly - 1 sets - 10 reps - Sidelying Thoracic Rotation with Open Book  - 1 x daily - 7 x weekly - 1 sets - 3 reps - 10 sec hold - Standing Gluteal Set with Overhead Arm Raise and Wall Slide  - 1 x daily - 7 x weekly - 2 sets - 5 reps - Sidelying Hip Circles  - 2 x daily - 7 x weekly - 2 sets - 10 circles each way  ASSESSMENT:  CLINICAL IMPRESSION: Pt has met/partially met all LTGs at this time and is ready for d/c from PT.  Has excellent understanding of HEP and benefits of continued exercise and building  activity tolerance.    OBJECTIVE IMPAIRMENTS: Abnormal gait, decreased activity tolerance, decreased mobility, difficulty walking, decreased ROM, decreased strength, hypomobility, increased fascial restrictions, increased muscle spasms, impaired flexibility, improper body mechanics, postural dysfunction, and pain.   ACTIVITY LIMITATIONS: carrying, lifting, bending, sitting, standing, squatting, sleeping, toileting, dressing, hygiene/grooming, and locomotion level  PARTICIPATION LIMITATIONS: meal prep, cleaning, laundry, shopping, community activity, and yard work  PERSONAL FACTORS: Age, Fitness, Past/current experiences, Social background, and Time since onset of injury/illness/exacerbation are also affecting patient's functional outcome.   REHAB POTENTIAL: Fair deconditioning, altered biomechanics from severely limited range in past TKR   CLINICAL DECISION MAKING: Evolving/moderate complexity  EVALUATION COMPLEXITY: Moderate   GOALS: Goals reviewed with patient? No  SHORT TERM GOALS: Target date: 11/17/2023    Will be compliant with appropriate progressive HEP GOAL STATUS: IN PROGRESS 11/09/23   2. Will demonstrate improved postural awareness with all functional tasks, use of ergonomic aides PRN/as desired GOAL STATUS: IN PROGRESS 11/09/23   3. Will demonstrate good functional biomechanics for bed mobility and floor to waist lifting mechanics  GOAL STATUS: IN PROGRESS 11/09/23  4. Lumbar ROM to be no more than 25% all planes of motion  GOAL STATUS: IN PROGRESS 11/09/23  LONG TERM GOALS: Target date: 12/15/2023    MMT to have improved by at least 1 grade all weak groups  Baseline:  Goal status: PARTIALLY MET 12/08/23  2.  Will be to stand and cook for at least 30 minutes without increase in pain  Baseline:  Goal status: PARTIALLY MET (UP TO 20 MIN) 12/08/23  3.  Will be able to walk at least 3 blocks for exercise without increase in back pain  Baseline:  Goal status: MET  12/08/23  4.  Pain to be no more than 4/10 at worst  Baseline:  Goal status: MET 12/08/23  5.  PSFS to have improved by at least 3 points to show improved subjective function  Baseline:  Goal status: MET 12/04/23    PLAN:  PT FREQUENCY: 2x/week  PT DURATION: 8 weeks  PLANNED INTERVENTIONS: 97750- Physical Performance Testing, 97110-Therapeutic exercises, 97530- Therapeutic activity, [id number]- Neuromuscular re-education, 97535- Self Care, [id number]- Manual therapy, [id number]- Aquatic Therapy, [id number]- Electrical stimulation (unattended), 97035- Ultrasound, Taping, and Dry Needling.  PLAN FOR NEXT SESSION: d/c PT   Marley Simmers, PT, DPT 12/08/23  10:42 AM    PHYSICAL THERAPY DISCHARGE SUMMARY  Visits from Start of Care: 10  Current functional level related to goals / functional outcomes: See above   Remaining deficits: See above   Education / Equipment: HEP   Patient agrees to discharge. Patient goals were partially met. Patient is being discharged due to being pleased with the current functional level.   Marley Simmers, PT, DPT 12/08/23 10:42 AM

## 2023-12-14 DIAGNOSIS — H35372 Puckering of macula, left eye: Secondary | ICD-10-CM | POA: Diagnosis not present

## 2023-12-14 DIAGNOSIS — H43813 Vitreous degeneration, bilateral: Secondary | ICD-10-CM | POA: Diagnosis not present

## 2023-12-26 DIAGNOSIS — M62830 Muscle spasm of back: Secondary | ICD-10-CM | POA: Diagnosis not present

## 2023-12-26 DIAGNOSIS — I10 Essential (primary) hypertension: Secondary | ICD-10-CM | POA: Diagnosis not present

## 2023-12-26 DIAGNOSIS — R7303 Prediabetes: Secondary | ICD-10-CM | POA: Diagnosis not present

## 2023-12-26 DIAGNOSIS — D729 Disorder of white blood cells, unspecified: Secondary | ICD-10-CM | POA: Diagnosis not present

## 2023-12-26 DIAGNOSIS — E782 Mixed hyperlipidemia: Secondary | ICD-10-CM | POA: Diagnosis not present

## 2023-12-28 DIAGNOSIS — E785 Hyperlipidemia, unspecified: Secondary | ICD-10-CM | POA: Diagnosis not present

## 2023-12-28 DIAGNOSIS — R5383 Other fatigue: Secondary | ICD-10-CM | POA: Diagnosis not present

## 2023-12-28 DIAGNOSIS — E1169 Type 2 diabetes mellitus with other specified complication: Secondary | ICD-10-CM | POA: Diagnosis not present

## 2023-12-28 DIAGNOSIS — D729 Disorder of white blood cells, unspecified: Secondary | ICD-10-CM | POA: Diagnosis not present

## 2024-01-07 ENCOUNTER — Other Ambulatory Visit: Payer: Self-pay | Admitting: Allergy

## 2024-01-19 ENCOUNTER — Telehealth: Payer: Self-pay | Admitting: Neurology

## 2024-01-19 ENCOUNTER — Other Ambulatory Visit: Payer: Self-pay | Admitting: Neurology

## 2024-01-19 ENCOUNTER — Other Ambulatory Visit: Payer: Self-pay

## 2024-01-19 DIAGNOSIS — G250 Essential tremor: Secondary | ICD-10-CM

## 2024-01-19 MED ORDER — PRIMIDONE 50 MG PO TABS: ORAL_TABLET | ORAL | 0 refills | Status: DC

## 2024-01-19 NOTE — Telephone Encounter (Signed)
 Pt called and she stated that she needs to get Primidone  refill due to she only have 3 more days and her Primidone  will be out. Medicine needs to go to [facility] on [address].

## 2024-02-07 ENCOUNTER — Other Ambulatory Visit: Payer: Self-pay | Admitting: Allergy

## 2024-02-09 ENCOUNTER — Other Ambulatory Visit: Payer: Self-pay | Admitting: Allergy

## 2024-02-26 DIAGNOSIS — L810 Postinflammatory hyperpigmentation: Secondary | ICD-10-CM | POA: Diagnosis not present

## 2024-02-26 DIAGNOSIS — L811 Chloasma: Secondary | ICD-10-CM | POA: Diagnosis not present

## 2024-02-26 NOTE — Progress Notes (Signed)
 Chief Complaint  Patient presents with  . hyperpigmentation    SUBJECTIVE: [name] is a 83 y.o. female who is a return patient to the clinic. Patient presents for follow up on post inflammatory hyperpigmentation on the face secondary to  eosinophilic erythema anular. Patient was given Eucerin radiant tone with thamidol samples at her last visit with minimal improvement.  ROS:  Constitutional: The patient states that they otherwise feel well.  Skin: Negative for any other lumps, bumps, or skin concerns.   Past medical history, family history, and social history is noted in the encounter and has been updated and reviewed.   Past Medical History Medical History[1] Family History Family History[2] Social History Social History   Socioeconomic History  . Marital status: Divorced    Spouse name: Not on file  . Number of children: Not on file  . Years of education: Not on file  . Highest education level: Not on file  Occupational History  . Not on file  Tobacco Use  . Smoking status: Never  . Smokeless tobacco: Never  Vaping Use  . Vaping status: Never Used  Substance and Sexual Activity  . Alcohol  use: No  . Drug use: No  . Sexual activity: Never  Other Topics Concern  . Not on file  Social History Narrative  . Not on file   Social Drivers of Health   Food Insecurity: Low Risk  (10/16/2023)   Food vital sign   . Within the past 12 months, you worried that your food would run out before you got money to buy more: Never true   . Within the past 12 months, the food you bought just didn't last and you didn't have money to get more: Never true  Transportation Needs: No Transportation Needs (10/16/2023)   Transportation   . In the past 12 months, has lack of reliable transportation kept you from medical appointments, meetings, work or from getting things needed for daily living? : No  Safety: Low Risk  (10/16/2023)   Safety   . How often does anyone, including family and  friends, physically hurt you?: Never   . How often does anyone, including family and friends, insult or talk down to you?: Never   . How often does anyone, including family and friends, threaten you with harm?: Never   . How often does anyone, including family and friends, scream or curse at you?: Never  Living Situation: Low Risk  (10/16/2023)   Living Situation   . What is your living situation today?: I have a steady place to live   . Think about the place you live. Do you have problems with any of the following? Choose all that apply:: None/None on this list   OBJECTIVE: Ht 1.626 m (5' 4)   Wt 77.1 kg (170 lb)   BMI 29.18 kg/m   Patient is a healthy pleasant  appearing 83 y.o. female who appears A&Ox3 with appropriate mood and affect. Declines full skin examination today.  Skin examination was performed via inspection and palpation of the head (face, ears), eyelids, lips, neck,  bilateral hands, fingers, and fingernails. The areas covered by the patient's undergarments were not examined today.  Significant exam findings include:  Hyperpigmented macules on forehead and malar area c/w post inflammatory hyperpigmentation  There are no other significant exam findings on patient's skin exam today  ASSESSMENT:  1. Post-inflammatory hyperpigmentation  azelaic acid 15 % gel    2. Melasma  azelaic acid 15 % gel  PLAN: Extensive counseling was provided to the patient regarding the diagnosis and treatment options. Prescription:  - azelaic acid 15% gel, Apply topically 2 (two) times a day. To affected areas; Dispense: 50 g; Refill: 6 Side effects and appropriate use of all the medication(s) were discussed with the patient today. Risks, benefits, and alternatives were discussed. Patient advised to use the medication(s) as directed by their healthcare provider. The patient was encouraged to read, review, and understand all associated package inserts and contact our office with any  questions or concerns. The patient accepts the risks of the treatment plan and had an opportunity to ask questions. Advised monthly self total body skin examination and for the patient to report any new or changing lesions. Advised the use of photoprotection including the use of over the counter sunscreens and photoprotective clothing. The patient was encouraged to call or send a message through [facility] for any questions or concerns.  Return in about 6 months (around 08/25/2024). - but sooner as needed.   Orders Placed This Encounter  Medications  . azelaic acid 15 % gel    Sig: Apply topically 2 (two) times a day. To affected areas    Dispense:  50 g    Refill:  6    No orders of the defined types were placed in this encounter.   Scheduled Future Appointments       Provider Department Dept Phone Center   10/10/2024 10:00 AM [facility] HEM ONC [facility] PERIPHERAL LAB [facility] [phone] Hematology Oncology [phone] [facility] [place]   10/10/2024 10:30 AM Sharma MARLA Bathe [facility] [phone] Hematology Oncology [phone] [facility] [place]      This document serves as a record of services personally performed by Lawrence Lapine I Sonya Hildegard, NP. It was created on their behalf by Macky CHRISTELLA Hue, Scribe, a trained medical scribe. The creation of this record is the provider's dictation and/or activities during the visit.   Electronically signed by: Lilyen M Soeun, Scribe 02/26/2024 10:26 AM  I agree the documentation is accurate and complete.  Electronically signed by: Lawrence Lapine LILLETTE Sonya Hildegard, NP 02/26/2024 10:26 AM         [1] Past Medical History: Diagnosis Date  . Ambulates with cane   . Arthritis   . Asthma (CMD)    seasonal  . Cataract 10/2007   Left eye  . Cervical cancer    (CMD) 11 /1970   Pre cancer of the  cervix  . 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 Hypertension   . Keloid   . Seasonal  allergic rhinitis   . Skin inflammation   [2] Family History Problem Relation Name Age of Onset  . Hypertension Father Velma CHRISTELLA. Bennett        High blood pressure  . Cancer Sister Stoney Nevin        Lung cancer  . Diabetes Neg Hx    . Psoriasis Neg Hx    . Eczema Neg Hx

## 2024-03-14 ENCOUNTER — Ambulatory Visit (INDEPENDENT_AMBULATORY_CARE_PROVIDER_SITE_OTHER): Admitting: Allergy

## 2024-03-14 ENCOUNTER — Encounter: Payer: Self-pay | Admitting: Allergy

## 2024-03-14 ENCOUNTER — Other Ambulatory Visit: Payer: Self-pay

## 2024-03-14 VITALS — BP 126/80 | HR 75 | Temp 97.6°F | Ht 64.5 in | Wt 168.9 lb

## 2024-03-14 DIAGNOSIS — J3089 Other allergic rhinitis: Secondary | ICD-10-CM | POA: Diagnosis not present

## 2024-03-14 DIAGNOSIS — J302 Other seasonal allergic rhinitis: Secondary | ICD-10-CM

## 2024-03-14 DIAGNOSIS — J4540 Moderate persistent asthma, uncomplicated: Secondary | ICD-10-CM

## 2024-03-14 MED ORDER — SYMBICORT 160-4.5 MCG/ACT IN AERO: INHALATION_SPRAY | RESPIRATORY_TRACT | 5 refills | Status: AC

## 2024-03-14 MED ORDER — FLUTICASONE PROPIONATE 50 MCG/ACT NA SUSP: NASAL | 5 refills | Status: AC

## 2024-03-14 MED ORDER — IPRATROPIUM BROMIDE 0.06 % NA SOLN: NASAL | 5 refills | Status: AC

## 2024-03-14 MED ORDER — MONTELUKAST SODIUM 10 MG PO TABS: 10.0000 mg | ORAL_TABLET | Freq: Every day | ORAL | 1 refills | Status: AC

## 2024-03-14 MED ORDER — LEVALBUTEROL TARTRATE 45 MCG/ACT IN AERO: INHALATION_SPRAY | RESPIRATORY_TRACT | 1 refills | Status: AC

## 2024-03-14 MED ORDER — CARBINOXAMINE MALEATE 6 MG PO TABS: 1.0000 | ORAL_TABLET | Freq: Two times a day (BID) | ORAL | 2 refills | Status: DC

## 2024-03-14 NOTE — Progress Notes (Signed)
 Follow-up Note  RE: [name] MRN: [id number] DOB: [birth year] Date of Office Visit: 03/14/2024   History of present illness: [name] is a 83 y.o. female presenting today for follow-up of allergic rhinitis, cough.  She was last seen in the office on 09/14/23 by myself.  Discussed the use of AI scribe software for clinical note transcription with the patient, who gave verbal consent to proceed.  She reports increased allergy symptoms this year. There has been an unusual overlap of tree, grass, and weed pollens throughout spring, summer, and fall, as discussed during the visit. Her voice has been affected, and she experiences nasal drip, especially when tilting her head down.  She uses Flonase  and ipratropium (Atrovent ) spray twice daily for symptom control, which helps with her symptoms. She experiences hoarseness daily and reports mucus drainage down her throat. She uses Ryvent , an antihistamine, one tablet twice a day, and performs nasal saline rinses twice daily, followed by her nasal sprays.  Her breathing is generally well-controlled, using a rescue inhaler approximately once or twice every two weeks. She reports that any increase in coughing occurs along with mucus drainage and voice changes.  She mentions difficulty obtaining Ryvent  from her pharmacy, as it is not commonly stocked, requiring advance notice for refills.     Review of systems: 10pt  ROS negative unless noted on HPI  Past medical/social/surgical/family history have been reviewed and are unchanged unless specifically indicated below.  No changes  Medication List: Current Outpatient Medications  Medication Sig Dispense Refill   albuterol  (VENTOLIN  HFA) 108 (90 Base) MCG/ACT inhaler Inhale 2 puffs into the lungs every 4 (four) hours as needed for wheezing or shortness of breath. 8.5 g 1   CALCIUM-MAGNESIUM-ZINC PO Take by mouth.     cholecalciferol (VITAMIN D3) 25 MCG (1000 UT) tablet Take 1,000 Units  by mouth daily.     docusate sodium (COLACE) 100 MG capsule Take 100 mg by mouth daily as needed for mild constipation.     fluticasone  (FLONASE ) 50 MCG/ACT nasal spray 1-2 sprays each nostril daily for 1-2 weeks at a time before stopping once nasal congestion improves for maximum benefit. 16 g 5   ipratropium (ATROVENT ) 0.06 % nasal spray USE 2 SPRAYS NASALLY UP TO 3 TO 4 TIMES DAILY AS NEEDED FOR NASAL DRAINAGE OR THROAT CLEARING CONTROL 15 mL 5   montelukast  (SINGULAIR ) 10 MG tablet TAKE 1 TABLET(10 MG) BY MOUTH AT BEDTIME 90 tablet 1   multivitamin-iron-minerals-folic acid  (CENTRUM) chewable tablet Chew 1 tablet by mouth daily.     NEXLETOL 180 MG TABS Take 1 tablet by mouth daily.     olmesartan-hydrochlorothiazide (BENICAR HCT) 20-12.5 MG per tablet Take 1 tablet by mouth daily.     primidone  (MYSOLINE ) 50 MG tablet TAKE 4 TABLETS BY MOUTH IN THE MORNING AND 1 TABLET AT NIGHT 450 tablet 0   SYMBICORT  160-4.5 MCG/ACT inhaler 1-2 puffs daily to prevent cough and wheeze. Increase to 2 puffs twice daily for 2 weeks during flares. 10.2 g 5   Carbinoxamine  Maleate (RYVENT ) 6 MG TABS Take 1 tablet (6 mg total) by mouth 2 (two) times daily. 120 tablet 2   methocarbamol  (ROBAXIN ) 500 MG tablet Take 1 tablet (500 mg total) by mouth every 6 (six) hours as needed for muscle spasms. 30 tablet 0   traMADol  (ULTRAM ) 50 MG tablet Take 1 tablet (50 mg total) by mouth every 6 (six) hours as needed. 30 tablet 0   No current  facility-administered medications for this visit.     Known medication allergies: Allergies  Allergen Reactions   Latex Shortness Of Breath and Other (See Comments)     Physical examination: Blood pressure 126/80, pulse 75, temperature 97.6 F (36.4 C), temperature source Temporal, height 5' 4.5 (1.638 m), weight 168 lb 14.4 oz (76.6 kg), SpO2 97%.  General: Alert, interactive, in no acute distress. HEENT: PERRLA, TMs pearly gray, turbinates non-edematous without discharge,  post-pharynx non erythematous. Neck: Supple without lymphadenopathy. Lungs: Clear to auscultation without wheezing, rhonchi or rales. {no increased work of breathing. CV: Normal S1, S2 without murmurs. Abdomen: Nondistended, nontender. Skin: Warm and dry, without lesions or rashes. Extremities:  No clubbing, cyanosis or edema. Neuro:   Grossly intact.  Diagnostics/Labs:  Spirometry: FEV1: 1.12L 69%, FVC: 3.2L 150%, ratio consistent with obstructive pattern with reduced FEV1.  This is a reduced level for her from previous study  Assessment and plan: Cough Continue montelukast  10 mg once a day to prevent cough or wheeze Continue Symbicort  160- use 2 puffs 1 time a day to prevent cough or wheeze.  Use with spacer. Rinse mouth after use.  Continue Xopenex  2 puffs once every 4-6 hours as needed for cough or wheeze For asthma flare or respiratory illness, increase Symbicort  160 to 2 puffs twice a day with a spacer for 2 weeks or when cough and wheeze free then return to previous dosing  Allergic rhinitis Continue allergen avoidance measures Use Flonase  1-2 sprays in each nostril daily for 1-2 weeks at a time before stopping once nasal congestion improves for maximum benefit Increase to Ipratropium 2 sprays 3 times a day (morning, midday, evening) if needed for nasal drainage/throat clearing control. Max use of 4 times a day as needed In the right nostril, point the applicator out toward the right ear. In the left nostril, point the applicator out toward the left ear Continue saline nasal rinses twice a day for nasal symptoms. Use this before any medicated nasal sprays for best result Use RyVent  (carbinoxamine ) 6 mg 1 tab twice a day to control nasal symptoms.     Follow up in 6 months or sooner if needed  I appreciate the opportunity to take part in [name]'s care. Please do not hesitate to contact me with questions.  Sincerely,   Danita Brain, MD Allergy/Immunology [facility] 

## 2024-03-14 NOTE — Patient Instructions (Addendum)
 Cough Continue montelukast  10 mg once a day to prevent cough or wheeze Continue Symbicort  160- use 2 puffs 1 time a day to prevent cough or wheeze.  Use with spacer. Rinse mouth after use.  Continue Xopenex  2 puffs once every 4-6 hours as needed for cough or wheeze For asthma flare or respiratory illness, increase Symbicort  160 to 2 puffs twice a day with a spacer for 2 weeks or when cough and wheeze free then return to previous dosing  Allergic rhinitis Continue allergen avoidance measures Use Flonase  1-2 sprays in each nostril daily for 1-2 weeks at a time before stopping once nasal congestion improves for maximum benefit Increase to Ipratropium 2 sprays 3 times a day (morning, midday, evening) if needed for nasal drainage/throat clearing control. Max use of 4 times a day as needed In the right nostril, point the applicator out toward the right ear. In the left nostril, point the applicator out toward the left ear Continue saline nasal rinses twice a day for nasal symptoms. Use this before any medicated nasal sprays for best result Use RyVent  (carbinoxamine ) 6 mg 1 tab twice a day to control nasal symptoms.     Follow up in 6 months or sooner if needed.

## 2024-03-28 DIAGNOSIS — Z23 Encounter for immunization: Secondary | ICD-10-CM | POA: Diagnosis not present

## 2024-04-15 DIAGNOSIS — H6123 Impacted cerumen, bilateral: Secondary | ICD-10-CM | POA: Diagnosis not present

## 2024-04-22 ENCOUNTER — Encounter: Payer: Self-pay | Admitting: Radiology

## 2024-05-01 DIAGNOSIS — E785 Hyperlipidemia, unspecified: Secondary | ICD-10-CM | POA: Diagnosis not present

## 2024-05-01 DIAGNOSIS — R5383 Other fatigue: Secondary | ICD-10-CM | POA: Diagnosis not present

## 2024-05-01 DIAGNOSIS — D729 Disorder of white blood cells, unspecified: Secondary | ICD-10-CM | POA: Diagnosis not present

## 2024-05-01 DIAGNOSIS — E1169 Type 2 diabetes mellitus with other specified complication: Secondary | ICD-10-CM | POA: Diagnosis not present

## 2024-05-06 ENCOUNTER — Telehealth: Payer: Self-pay | Admitting: Allergy

## 2024-05-06 MED ORDER — CARBINOXAMINE MALEATE 6 MG PO TABS: 1.0000 | ORAL_TABLET | Freq: Two times a day (BID) | ORAL | 3 refills | Status: DC

## 2024-05-06 NOTE — Telephone Encounter (Signed)
 Patient called stating she is needing a refill on Ryvent  sent to [facility] on [address] and [address]

## 2024-05-06 NOTE — Telephone Encounter (Signed)
 Refill sent in. I called the patient and left a vm to call the office back to inform.

## 2024-05-08 ENCOUNTER — Other Ambulatory Visit: Payer: Self-pay | Admitting: Allergy

## 2024-07-16 ENCOUNTER — Other Ambulatory Visit: Payer: Self-pay | Admitting: *Deleted

## 2024-07-16 ENCOUNTER — Telehealth: Payer: Self-pay | Admitting: Neurology

## 2024-07-16 ENCOUNTER — Other Ambulatory Visit: Payer: Self-pay | Admitting: Neurology

## 2024-07-16 DIAGNOSIS — G250 Essential tremor: Secondary | ICD-10-CM

## 2024-07-16 NOTE — Telephone Encounter (Signed)
 [name](self) called and lvm stating that she is almost out of her primidone , and there are no more refills left. Please send Rx to [facility]. She stated that she has 4 days left.  PH: [phone]

## 2024-07-16 NOTE — Telephone Encounter (Signed)
 Steffany lvm regarding medication.   PH: [phone]

## 2024-07-17 NOTE — Telephone Encounter (Signed)
 Called patient and left voicemail her meds have been refilled

## 2024-08-01 ENCOUNTER — Ambulatory Visit: Admitting: Neurology

## 2024-08-15 ENCOUNTER — Telehealth: Payer: Medicare Other | Admitting: Neurology

## 2024-09-12 ENCOUNTER — Ambulatory Visit: Admitting: Allergy
